# Patient Record
Sex: Female | Born: 1950
Health system: Southern US, Community
[De-identification: ages and names within clinical notes are randomized; demographics above are authoritative.]

## PROBLEM LIST (undated history)

## (undated) DIAGNOSIS — E785 Hyperlipidemia, unspecified: Secondary | ICD-10-CM

## (undated) DIAGNOSIS — J42 Unspecified chronic bronchitis: Secondary | ICD-10-CM

## (undated) DIAGNOSIS — F419 Anxiety disorder, unspecified: Secondary | ICD-10-CM

## (undated) DIAGNOSIS — J449 Chronic obstructive pulmonary disease, unspecified: Secondary | ICD-10-CM

## (undated) DIAGNOSIS — J45909 Unspecified asthma, uncomplicated: Secondary | ICD-10-CM

## (undated) DIAGNOSIS — K219 Gastro-esophageal reflux disease without esophagitis: Secondary | ICD-10-CM

## (undated) DIAGNOSIS — M069 Rheumatoid arthritis, unspecified: Secondary | ICD-10-CM

## (undated) DIAGNOSIS — M797 Fibromyalgia: Secondary | ICD-10-CM

## (undated) DIAGNOSIS — G8929 Other chronic pain: Secondary | ICD-10-CM

## (undated) DIAGNOSIS — R7303 Prediabetes: Secondary | ICD-10-CM

## (undated) DIAGNOSIS — F32A Depression, unspecified: Secondary | ICD-10-CM

## (undated) DIAGNOSIS — Z87442 Personal history of urinary calculi: Secondary | ICD-10-CM

## (undated) DIAGNOSIS — I10 Essential (primary) hypertension: Secondary | ICD-10-CM

## (undated) DIAGNOSIS — I2510 Atherosclerotic heart disease of native coronary artery without angina pectoris: Secondary | ICD-10-CM

## (undated) DIAGNOSIS — M545 Low back pain, unspecified: Secondary | ICD-10-CM

## (undated) DIAGNOSIS — G44209 Tension-type headache, unspecified, not intractable: Secondary | ICD-10-CM

## (undated) DIAGNOSIS — Z8719 Personal history of other diseases of the digestive system: Secondary | ICD-10-CM

## (undated) DIAGNOSIS — J189 Pneumonia, unspecified organism: Secondary | ICD-10-CM

## (undated) DIAGNOSIS — F329 Major depressive disorder, single episode, unspecified: Secondary | ICD-10-CM

## (undated) DIAGNOSIS — N189 Chronic kidney disease, unspecified: Secondary | ICD-10-CM

## (undated) DIAGNOSIS — G4730 Sleep apnea, unspecified: Secondary | ICD-10-CM

## (undated) DIAGNOSIS — M1990 Unspecified osteoarthritis, unspecified site: Secondary | ICD-10-CM

## (undated) HISTORY — DX: Chronic obstructive pulmonary disease, unspecified: J44.9

## (undated) HISTORY — DX: Unspecified osteoarthritis, unspecified site: M19.90

## (undated) HISTORY — DX: Fibromyalgia: M79.7

## (undated) HISTORY — PX: BACK SURGERY: SHX140

## (undated) HISTORY — DX: Gastro-esophageal reflux disease without esophagitis: K21.9

## (undated) HISTORY — PX: CATARACT EXTRACTION W/ INTRAOCULAR LENS IMPLANT: SHX1309

## (undated) HISTORY — PX: LUMBAR DISC SURGERY: SHX700

---

## 1998-05-03 ENCOUNTER — Other Ambulatory Visit: Admission: RE | Admit: 1998-05-03 | Discharge: 1998-05-03 | Payer: Self-pay | Admitting: Internal Medicine

## 1999-04-25 ENCOUNTER — Other Ambulatory Visit: Admission: RE | Admit: 1999-04-25 | Discharge: 1999-04-25 | Payer: Self-pay | Admitting: Internal Medicine

## 1999-06-07 ENCOUNTER — Encounter: Payer: Self-pay | Admitting: Internal Medicine

## 1999-06-07 ENCOUNTER — Encounter: Admission: RE | Admit: 1999-06-07 | Discharge: 1999-06-07 | Payer: Self-pay | Admitting: Internal Medicine

## 2000-05-17 ENCOUNTER — Encounter: Payer: Self-pay | Admitting: Internal Medicine

## 2000-05-17 ENCOUNTER — Encounter: Admission: RE | Admit: 2000-05-17 | Discharge: 2000-05-17 | Payer: Self-pay | Admitting: Internal Medicine

## 2000-10-22 ENCOUNTER — Other Ambulatory Visit: Admission: RE | Admit: 2000-10-22 | Discharge: 2000-10-22 | Payer: Self-pay | Admitting: Gynecology

## 2001-05-08 ENCOUNTER — Other Ambulatory Visit: Admission: RE | Admit: 2001-05-08 | Discharge: 2001-05-08 | Payer: Self-pay | Admitting: Internal Medicine

## 2003-02-10 ENCOUNTER — Encounter: Admission: RE | Admit: 2003-02-10 | Discharge: 2003-02-10 | Payer: Self-pay | Admitting: Internal Medicine

## 2003-10-12 ENCOUNTER — Ambulatory Visit (HOSPITAL_COMMUNITY): Admission: RE | Admit: 2003-10-12 | Discharge: 2003-10-12 | Payer: Self-pay | Admitting: Plastic Surgery

## 2003-10-12 ENCOUNTER — Encounter (INDEPENDENT_AMBULATORY_CARE_PROVIDER_SITE_OTHER): Payer: Self-pay | Admitting: *Deleted

## 2003-10-12 ENCOUNTER — Ambulatory Visit (HOSPITAL_BASED_OUTPATIENT_CLINIC_OR_DEPARTMENT_OTHER): Admission: RE | Admit: 2003-10-12 | Discharge: 2003-10-12 | Payer: Self-pay | Admitting: Plastic Surgery

## 2004-04-27 ENCOUNTER — Other Ambulatory Visit: Admission: RE | Admit: 2004-04-27 | Discharge: 2004-04-27 | Payer: Self-pay | Admitting: Internal Medicine

## 2004-06-26 ENCOUNTER — Ambulatory Visit (HOSPITAL_COMMUNITY): Admission: RE | Admit: 2004-06-26 | Discharge: 2004-06-26 | Payer: Self-pay | Admitting: *Deleted

## 2005-06-11 ENCOUNTER — Other Ambulatory Visit: Admission: RE | Admit: 2005-06-11 | Discharge: 2005-06-11 | Payer: Self-pay | Admitting: Internal Medicine

## 2005-06-14 ENCOUNTER — Encounter: Admission: RE | Admit: 2005-06-14 | Discharge: 2005-06-14 | Payer: Self-pay | Admitting: Internal Medicine

## 2005-10-28 ENCOUNTER — Emergency Department (HOSPITAL_COMMUNITY): Admission: EM | Admit: 2005-10-28 | Discharge: 2005-10-28 | Payer: Self-pay | Admitting: Emergency Medicine

## 2006-08-20 ENCOUNTER — Other Ambulatory Visit: Admission: RE | Admit: 2006-08-20 | Discharge: 2006-08-20 | Payer: Self-pay | Admitting: Internal Medicine

## 2006-10-01 ENCOUNTER — Encounter: Admission: RE | Admit: 2006-10-01 | Discharge: 2006-10-01 | Payer: Self-pay | Admitting: Internal Medicine

## 2008-05-05 ENCOUNTER — Ambulatory Visit (HOSPITAL_COMMUNITY): Admission: RE | Admit: 2008-05-05 | Discharge: 2008-05-06 | Payer: Self-pay | Admitting: Neurosurgery

## 2008-08-30 ENCOUNTER — Encounter: Admission: RE | Admit: 2008-08-30 | Discharge: 2008-08-30 | Payer: Self-pay | Admitting: Internal Medicine

## 2009-08-25 ENCOUNTER — Other Ambulatory Visit: Admission: RE | Admit: 2009-08-25 | Discharge: 2009-08-25 | Payer: Self-pay | Admitting: Internal Medicine

## 2010-01-30 ENCOUNTER — Encounter
Admission: RE | Admit: 2010-01-30 | Discharge: 2010-01-30 | Payer: Self-pay | Source: Home / Self Care | Attending: Internal Medicine | Admitting: Internal Medicine

## 2010-04-19 LAB — BASIC METABOLIC PANEL
BUN: 17 mg/dL (ref 6–23)
CO2: 31 mEq/L (ref 19–32)
Calcium: 8.9 mg/dL (ref 8.4–10.5)
Chloride: 105 mEq/L (ref 96–112)
Creatinine, Ser: 1.1 mg/dL (ref 0.4–1.2)
GFR calc Af Amer: >60 mL/min (ref >60)
GFR calc non Af Amer: 51 mL/min — ABNORMAL LOW (ref >60)
Glucose, Bld: 139 mg/dL — ABNORMAL HIGH (ref 70–99)
Potassium: 4.1 mEq/L (ref 3.5–5.1)
Sodium: 138 mEq/L (ref 135–145)

## 2010-04-19 LAB — CBC
HCT: 37.5 % (ref 36.0–46.0)
Hemoglobin: 13.1 g/dL (ref 12.0–15.0)
MCHC: 35 g/dL (ref 30.0–36.0)
MCV: 86.6 fL (ref 78.0–100.0)
Platelets: 211 10*3/uL (ref 150–400)
RBC: 4.33 MIL/uL (ref 3.87–5.11)
RDW: 13.9 % (ref 11.5–15.5)
WBC: 10.6 10*3/uL — ABNORMAL HIGH (ref 4.0–10.5)

## 2010-05-23 NOTE — Op Note (Signed)
NAMECANDIAS, TACKITT NO.:  [id number]   MEDICAL RECORD NO.:  [id number]          PATIENT TYPE:  OIB   LOCATION:  [id number]                         FACILITY:  [facility]   PHYSICIAN:  Leeroy Cha, M.D.   DATE OF BIRTH:  07-02-1950   DATE OF PROCEDURE:  05/05/2008  DATE OF DISCHARGE:                               OPERATIVE REPORT   PREOPERATIVE DIAGNOSIS:  Left L3-L4 herniated disk with L3  radiculopathy.   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   PROCEDURE:  Left L3-L4 decompression of the L3 nerve root with the  Matrix system as well as microscope.   SURGEON:  Leeroy Cha, MD   ASSISTANT:  Marchia Meiers. Vertell Limber, MD   CLINICAL HISTORY:  The patient was seen in my office last week  complaining of pain in the left leg.  She actually was crying, quite a  bit of weakness of the quadriceps and iliopsoas.  X-rays showed  extraforaminal disk at the level of L3-4 compromising the L3 nerve root.  Surgery was advised.   PROCEDURE:  The patient was taken to the OR and she was positioned in  prone manner.  The skin was cleaned with DuraPrep.  With the C-arm, we  localized the area between L3-4 right at the level of the  intertransverse space.  Then, an incision was made about an inch using  several cylinders to dilate the area.  We were able to get right at the  level of the lateral facet of L3-4 and the transverse process of L3-4.  Muscles were removed.  Immediately, we found the L3 nerve root which was  swollen and really fixed to the floor.  Lysis was accomplished.  We  displaced the nerve root and immediately we removed 5 large fragments of  disk compromising the L3 nerve root.  At the end, we had good  decompression with plenty of room for the L3.  The area was irrigated.  Hemostasis was done with bipolar.  Fentanyl and Depo-Medrol were left in  the operative site and the wound was closed with Vicryl and Steri-  Strips.     ______________________________  Leeroy Cha, M.D.     EB/MEDQ  D:  05/05/2008  T:  05/06/2008  Job:  [id number]

## 2010-05-26 NOTE — Op Note (Signed)
Janet Johnston, Janet Johnston                  ACCOUNT NO.:  [id number]   MEDICAL RECORD NO.:  [id number]          PATIENT TYPE:  AMB   LOCATION:  ENDO                         FACILITY:  [facility]   PHYSICIAN:  Waverly Ferrari, M.D.    DATE OF BIRTH:  02-08-1950   DATE OF PROCEDURE:  06/26/2004  DATE OF DISCHARGE:                                 OPERATIVE REPORT   PROCEDURE:  Upper endoscopy.   INDICATIONS FOR PROCEDURE:  Gastroesophageal reflux disease.   ANESTHESIA:  Demerol 40, Versed 5 mg.   DESCRIPTION OF PROCEDURE:  With the patient mildly sedated in the left  lateral decubitus position, the Olympus videoscopic endoscope was inserted  in the mouth and passed under direct vision through the esophagus which  appeared normal into the stomach. The fundus, body, antrum, duodenal bulb,  second portion of duodenum all appeared normal. From this point, the  endoscope was slowly withdrawn taking circumferential views of the duodenal  mucosa until the endoscope had been pulled back into the stomach, placed in  retroflexion to view the stomach from below. The endoscope was then  straightened and withdrawn taking circumferential views of the remaining  gastric and esophageal mucosa. The patient's vital signs and pulse oximeter  remained stable. The patient tolerated the procedure well without apparent  complications.   FINDINGS:  Unremarkable examination. Proceed to colonoscopy.       GMO/MEDQ  D:  06/26/2004  T:  06/26/2004  Job:  [id number]

## 2010-05-26 NOTE — Op Note (Signed)
NAMENASTEHO, URAM                  ACCOUNT NO.:  [id number]   MEDICAL RECORD NO.:  [id number]          PATIENT TYPE:  AMB   LOCATION:  ENDO                         FACILITY:  [facility]   PHYSICIAN:  Waverly Ferrari, M.D.    DATE OF BIRTH:  [birth year]   DATE OF PROCEDURE:  06/26/2004  DATE OF DISCHARGE:                                 OPERATIVE REPORT   PROCEDURE:  Colonoscopy.   INDICATIONS:  Cancer screening.   ANESTHESIA:  Demerol 30, Versed 2 mg.   PROCEDURE:  With the patient mildly sedated in the left lateral decubitus  position, the Olympus videoscopic colonoscope was inserted in the rectum and  passed under direct vision with pressure applied.  We reached the cecum  identified by ileocecal base, base of cecum.  From this point, the  colonoscope was slowly withdrawn taking circumferential views of the colonic  mucosa stopping only in the rectum which appeared normal.  The rectum showed  hemorrhoids in retroflex view.  The endoscope was straightened and  withdrawn.  The patient's vital signs, pulse oximetry remained stable.  The  patient tolerated the procedure well without apparent complications.   FINDINGS:  Internal hemorrhoids as well as unremarkable examination.   PLAN:  Consider repeat examination in five to ten years.       GMO/MEDQ  D:  06/26/2004  T:  06/26/2004  Job:  [id number]

## 2012-01-24 ENCOUNTER — Other Ambulatory Visit: Payer: Self-pay | Admitting: Registered Nurse

## 2012-01-24 ENCOUNTER — Other Ambulatory Visit (HOSPITAL_COMMUNITY)
Admission: RE | Admit: 2012-01-24 | Discharge: 2012-01-24 | Disposition: A | Discharge status: Home / Self Care | Payer: BC Managed Care – PPO | Source: Ambulatory Visit | Attending: Internal Medicine | Admitting: Internal Medicine

## 2012-01-24 DIAGNOSIS — Z01419 Encounter for gynecological examination (general) (routine) without abnormal findings: Secondary | ICD-10-CM | POA: Insufficient documentation

## 2012-08-02 ENCOUNTER — Ambulatory Visit (HOSPITAL_COMMUNITY)
Admit: 2012-08-02 | Discharge: 2012-08-02 | Disposition: A | Discharge status: Home / Self Care | Payer: BC Managed Care – PPO | Attending: Emergency Medicine | Admitting: Emergency Medicine

## 2012-08-02 ENCOUNTER — Encounter (HOSPITAL_COMMUNITY): Payer: Self-pay | Admitting: Emergency Medicine

## 2012-08-02 ENCOUNTER — Emergency Department (HOSPITAL_COMMUNITY)
Admission: EM | Admit: 2012-08-02 | Discharge: 2012-08-02 | Disposition: A | Discharge status: Home / Self Care | Payer: BC Managed Care – PPO | Source: Home / Self Care | Attending: Emergency Medicine | Admitting: Emergency Medicine

## 2012-08-02 DIAGNOSIS — M79609 Pain in unspecified limb: Secondary | ICD-10-CM | POA: Insufficient documentation

## 2012-08-02 DIAGNOSIS — I809 Phlebitis and thrombophlebitis of unspecified site: Secondary | ICD-10-CM

## 2012-08-02 DIAGNOSIS — M7989 Other specified soft tissue disorders: Secondary | ICD-10-CM

## 2012-08-02 DIAGNOSIS — I8000 Phlebitis and thrombophlebitis of superficial vessels of unspecified lower extremity: Secondary | ICD-10-CM | POA: Insufficient documentation

## 2012-08-02 HISTORY — DX: Essential (primary) hypertension: I10

## 2012-08-02 HISTORY — DX: Rheumatoid arthritis, unspecified: M06.9

## 2012-08-02 MED ORDER — ENOXAPARIN (LOVENOX) PATIENT EDUCATION KIT: PACK | Freq: Once | Status: DC

## 2012-08-02 MED ORDER — ENOXAPARIN SODIUM 30 MG/0.3ML ~~LOC~~ SOLN: 90.0000 mg | Freq: Two times a day (BID) | SUBCUTANEOUS | Status: DC

## 2012-08-02 MED ORDER — ENOXAPARIN SODIUM 100 MG/ML ~~LOC~~ SOLN: 90.0000 mg | Freq: Once | SUBCUTANEOUS | Status: DC

## 2012-08-02 MED ORDER — ENOXAPARIN SODIUM 100 MG/ML ~~LOC~~ SOLN
90.0000 mg | Freq: Once | SUBCUTANEOUS | Status: AC
  Administered 2012-08-02: 90 mg via SUBCUTANEOUS
  Filled 2012-08-02: qty 1

## 2012-08-02 MED ORDER — ENOXAPARIN (LOVENOX) PATIENT EDUCATION KIT
PACK | Freq: Once | Status: DC
  Filled 2012-08-02: qty 1

## 2012-08-02 NOTE — ED Provider Notes (Signed)
Chief Complaint:   Chief Complaint  Patient presents with  . 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 These have not given her much trouble up until last night when she felt a cramp-like sensation in her right calf. Today the pain has subsided, but she has a tender, palpable cord on the surface of the calf which is slightly erythematous. She denies any swelling further up in the leg, chest pain, or shortness of breath. She has had no history of thrombophlebitis or pulmonary embolism. No recent prolonged car trips or history of cancer.  Review of Systems:  Other than noted above, the patient denies any of the following symptoms: Systemic:  No fever, chills, sweats, weight gain or loss. Respiratory:  No coughing, wheezing, or shortness of breath. Cardiac:  No chest pain, tightness, pressure or syncope. GI:  No abdominal pain, swelling, distension, nausea, or vomiting. GU:  No dysuria, frequency, or hematuria. Ext:  No joint pain, muscle pain, or weakness. Skin:  No rash or itching. Neuro:  No paresthesias.  Albee:  Past medical history, family history, social history, meds, and allergies were reviewed.  She is allergic to penicillin and Lyrica. She takes amlodipine, bisoprolol, folate acid, and methotrexate. She has rheumatoid arthritis, hypertension, hypercholesterolemia, and fibromyalgia.  Physical Exam:   Vital signs:  BP 131/80  Pulse 83  Temp(Src) 98.8 F (37.1 C) (Oral)  Resp 20  Wt 195 lb (88.451 kg)  SpO2 96% Gen:  Alert, oriented, in no distress. Neck:  No tenderness, adenopathy, or JVD. Lungs:  Breath sounds clear and equal bilaterally.  No rales, rhonchi or wheezes. Heart:  Regular rhythm, no gallops or murmers. Abdomen:  Soft, nontender, no organomegaly or mass. Ext:  She has a palpable cord in the posterior right calf with overlying erythema. The calf is  tender. The leg is somewhat swollen. Pulses are full. There is no pitting edema. Neuro:  Alert and oriented times 3.  No muscle weakness.  Sensation intact to light touch. Skin:  Warm and dry.  No rash or skin lesions.  Radiology:  Her venous duplex shows no evidence of deep venous thrombophlebitis. She does have superficial thrombophlebitis.  Course in Urgent Care Center:  She was given Lovenox 1 mg per kilogram as a single subcutaneous dose.  Assessment:  The encounter diagnosis was Superficial thrombophlebitis.  Since no evidence of DVT, does not need continued Lovenox or Coumadin. Will treat with elevation, rest, heat, and aspirin 325 mg, 2 tablets twice a day, and have her followup with her primary care Dr. early next week.  Plan:   1.  The following meds were prescribed:   Discharge Medication List as of 08/02/2012  7:06 PM     2.  The patient was instructed in symptomatic care and handouts were given. 3.  The patient was told to return if becoming worse in any way, if no better in 3 or 4 days, and given some red flag symptoms such as any shortness of breath or chest pain that would indicate earlier return. 4.  Follow up at the emergency room if she should have red flag symptoms and with her primary care Dr. for routine followup.    Harden Mo, MD 08/02/12 2003

## 2012-08-02 NOTE — Progress Notes (Signed)
VASCULAR LAB PRELIMINARY  PRELIMINARY  PRELIMINARY  PRELIMINARY  Right lower extremity venous Doppler completed.    Preliminary report:  There is no DVT noted in the right lower extremity.  There is superficial thrombophlebitis noted in varicosities in the right calf.  Janise Gora, RVT 08/02/2012, 7:08 PM

## 2012-08-02 NOTE — ED Notes (Signed)
Ordering lovenox from pharmacy.  Patient states weight of 195 pounds.  Also requested teaching kit from pharmacy

## 2012-08-02 NOTE — ED Notes (Signed)
Staff at registration is picking up lovenox.  Vascular lab ready, being transported by shuttle, family with patient

## 2012-08-02 NOTE — ED Notes (Signed)
Verified aspirin dosing with dr Jake Michaelis, no change in instructions

## 2012-08-02 NOTE — ED Notes (Signed)
Ready to return to [facility]

## 2012-08-02 NOTE — ED Notes (Signed)
[id number] Expected date:08/02/12<BR> Expected time: 7:01 PM<BR> Means of arrival:Car<BR> Comments:<BR> return from vascular lab

## 2012-08-02 NOTE — ED Notes (Signed)
Right leg pain, last night felt like a deep cramp in lower leg, today visible pink color to leg and patient reports this particular area is "touchy"

## 2012-08-18 ENCOUNTER — Other Ambulatory Visit: Payer: Self-pay | Admitting: Internal Medicine

## 2012-08-18 ENCOUNTER — Encounter (INDEPENDENT_AMBULATORY_CARE_PROVIDER_SITE_OTHER): Payer: BC Managed Care – PPO | Admitting: *Deleted

## 2012-08-18 DIAGNOSIS — R609 Edema, unspecified: Secondary | ICD-10-CM

## 2012-08-18 DIAGNOSIS — I809 Phlebitis and thrombophlebitis of unspecified site: Secondary | ICD-10-CM

## 2012-08-20 ENCOUNTER — Encounter: Payer: Self-pay | Admitting: Internal Medicine

## 2013-02-03 ENCOUNTER — Other Ambulatory Visit: Payer: Self-pay

## 2013-02-03 DIAGNOSIS — Z1231 Encounter for screening mammogram for malignant neoplasm of breast: Secondary | ICD-10-CM

## 2013-02-23 ENCOUNTER — Ambulatory Visit: Payer: BC Managed Care – PPO

## 2013-03-11 ENCOUNTER — Ambulatory Visit
Admission: RE | Admit: 2013-03-11 | Discharge: 2013-03-11 | Disposition: A | Discharge status: Home / Self Care | Payer: BC Managed Care – PPO | Source: Ambulatory Visit

## 2013-03-11 DIAGNOSIS — Z1231 Encounter for screening mammogram for malignant neoplasm of breast: Secondary | ICD-10-CM

## 2013-09-10 ENCOUNTER — Emergency Department (HOSPITAL_COMMUNITY): Payer: Worker's Compensation

## 2013-09-10 ENCOUNTER — Emergency Department (HOSPITAL_COMMUNITY)
Admission: EM | Admit: 2013-09-10 | Discharge: 2013-09-10 | Disposition: A | Discharge status: Home / Self Care | Payer: Worker's Compensation | Attending: Emergency Medicine | Admitting: Emergency Medicine

## 2013-09-10 ENCOUNTER — Encounter (HOSPITAL_COMMUNITY): Payer: Self-pay | Admitting: Emergency Medicine

## 2013-09-10 DIAGNOSIS — S0003XA Contusion of scalp, initial encounter: Secondary | ICD-10-CM | POA: Insufficient documentation

## 2013-09-10 DIAGNOSIS — M069 Rheumatoid arthritis, unspecified: Secondary | ICD-10-CM | POA: Insufficient documentation

## 2013-09-10 DIAGNOSIS — W19XXXA Unspecified fall, initial encounter: Secondary | ICD-10-CM

## 2013-09-10 DIAGNOSIS — S0083XA Contusion of other part of head, initial encounter: Principal | ICD-10-CM | POA: Insufficient documentation

## 2013-09-10 DIAGNOSIS — I10 Essential (primary) hypertension: Secondary | ICD-10-CM | POA: Insufficient documentation

## 2013-09-10 DIAGNOSIS — T148XXA Other injury of unspecified body region, initial encounter: Secondary | ICD-10-CM

## 2013-09-10 DIAGNOSIS — S298XXA Other specified injuries of thorax, initial encounter: Secondary | ICD-10-CM | POA: Insufficient documentation

## 2013-09-10 DIAGNOSIS — S0001XA Abrasion of scalp, initial encounter: Secondary | ICD-10-CM

## 2013-09-10 DIAGNOSIS — W11XXXA Fall on and from ladder, initial encounter: Secondary | ICD-10-CM | POA: Insufficient documentation

## 2013-09-10 DIAGNOSIS — Y9389 Activity, other specified: Secondary | ICD-10-CM | POA: Insufficient documentation

## 2013-09-10 DIAGNOSIS — Z7982 Long term (current) use of aspirin: Secondary | ICD-10-CM | POA: Insufficient documentation

## 2013-09-10 DIAGNOSIS — Y9289 Other specified places as the place of occurrence of the external cause: Secondary | ICD-10-CM | POA: Insufficient documentation

## 2013-09-10 DIAGNOSIS — S0990XA Unspecified injury of head, initial encounter: Secondary | ICD-10-CM | POA: Insufficient documentation

## 2013-09-10 DIAGNOSIS — S1093XA Contusion of unspecified part of neck, initial encounter: Principal | ICD-10-CM

## 2013-09-10 DIAGNOSIS — F17200 Nicotine dependence, unspecified, uncomplicated: Secondary | ICD-10-CM | POA: Insufficient documentation

## 2013-09-10 DIAGNOSIS — Z880 Allergy status to penicillin: Secondary | ICD-10-CM | POA: Insufficient documentation

## 2013-09-10 DIAGNOSIS — IMO0002 Reserved for concepts with insufficient information to code with codable children: Secondary | ICD-10-CM | POA: Insufficient documentation

## 2013-09-10 DIAGNOSIS — Z79899 Other long term (current) drug therapy: Secondary | ICD-10-CM | POA: Insufficient documentation

## 2013-09-10 DIAGNOSIS — Z23 Encounter for immunization: Secondary | ICD-10-CM | POA: Insufficient documentation

## 2013-09-10 MED ORDER — MORPHINE SULFATE 4 MG/ML IJ SOLN
4.0000 mg | Freq: Once | INTRAMUSCULAR | Status: AC
  Administered 2013-09-10: 4 mg via INTRAMUSCULAR

## 2013-09-10 MED ORDER — TETANUS-DIPHTH-ACELL PERTUSSIS 5-2.5-18.5 LF-MCG/0.5 IM SUSP
0.5000 mL | Freq: Once | INTRAMUSCULAR | Status: AC
  Administered 2013-09-10: 0.5 mL via INTRAMUSCULAR
  Filled 2013-09-10: qty 0.5

## 2013-09-10 MED ORDER — TRAMADOL HCL 50 MG PO TABS: 50.0000 mg | ORAL_TABLET | Freq: Four times a day (QID) | ORAL | Status: DC | PRN

## 2013-09-10 MED ORDER — MORPHINE SULFATE 2 MG/ML IJ SOLN
INTRAMUSCULAR | Status: AC
  Filled 2013-09-10: qty 2

## 2013-09-10 MED ORDER — HYDROCODONE-ACETAMINOPHEN 5-325 MG PO TABS: 1.0000 | ORAL_TABLET | Freq: Once | ORAL | Status: DC

## 2013-09-10 NOTE — ED Notes (Signed)
Patient transported to X-ray 

## 2013-09-10 NOTE — ED Provider Notes (Signed)
CSN: [id number]     Arrival date & time 09/10/13  1147 History   First MD Initiated Contact with Patient 09/10/13 1357     Chief Complaint  Patient presents with  . Fall    The patient said she fell off the ladder and hit butt first.  She denies LOC but she hit her head, and her right forearm.     (Consider location/radiation/quality/duration/timing/severity/associated sxs/prior Treatment) HPI  This is a 63 year old female who presents following a fall. Patient states that she was on the next to last step on a stepladder and misstepped. She fell backwards hitting her bottom on a concrete floor and striking her head on some chilling. She denies loss of consciousness. She is on a baby aspirin daily but no other anticoagulants.  She reports bilateral buttock pain, back pain, and to 2/10 head pain. She was evaluated by EMS but refused transport.  Patient has been ambulatory. She denies any weakness, numbness, or tingling in her legs.  Patient also reports weird sensation in her right chest. It is worse with movement. She did not strike it on anything that she knows of. She denies any shortness of breath.  Past Medical History  Diagnosis Date  . Hypertension   . Rheumatoid arthritis    Past Surgical History  Procedure Laterality Date  . Lower back     History reviewed. No pertinent family history. History  Substance Use Topics  . Smoking status: Current Every Day Smoker  . Smokeless tobacco: Not on file  . Alcohol Use: No   OB History   Grav Para Term Preterm Abortions TAB SAB Ect Mult Living                 Review of Systems  Constitutional: Negative for fever.  Respiratory: Negative for cough, chest tightness and shortness of breath.   Cardiovascular: Negative for chest pain.  Gastrointestinal: Negative for nausea, vomiting and abdominal pain.  Genitourinary: Negative for dysuria.  Musculoskeletal: Positive for back pain. Negative for gait problem and neck pain.  Skin: Positive  for wound.  Neurological: Positive for headaches.  Psychiatric/Behavioral: Negative for confusion.  All other systems reviewed and are negative.     Allergies  Lyrica; Penicillins; and Sulfa antibiotics  Home Medications   Prior to Admission medications   Medication Sig Start Date End Date Taking? Authorizing Provider  amLODipine (NORVASC) 5 MG tablet Take 5 mg by mouth daily.   Yes Historical Provider, MD  aspirin 81 MG tablet Take 81 mg by mouth at bedtime.    Yes Historical Provider, MD  bisoprolol (ZEBETA) 5 MG tablet Take 5 mg by mouth at bedtime.   Yes Historical Provider, MD  ergocalciferol (VITAMIN D2) 50000 UNITS capsule Take 50,000 Units by mouth every Friday.   Yes Historical Provider, MD  FLUoxetine (PROZAC) 10 MG tablet Take 20 mg by mouth every morning.    Yes Historical Provider, MD  losartan (COZAAR) 25 MG tablet Take 50 mg by mouth at bedtime.    Yes Historical Provider, MD  traMADol (ULTRAM) 50 MG tablet Take 50 mg by mouth every 6 (six) hours as needed for severe pain.   Yes Historical Provider, MD  traMADol (ULTRAM) 50 MG tablet Take 1 tablet (50 mg total) by mouth every 6 (six) hours as needed. 09/10/13   Merryl Hacker, MD   BP 117/59  Pulse 70  Temp(Src) 98 F (36.7 C) (Oral)  Resp 16  Ht 5\' 4"  (1.626 m)  Wt  192 lb (87.091 kg)  BMI 32.94 kg/m2  SpO2 95% Physical Exam  Nursing note and vitals reviewed. Constitutional: She is oriented to person, place, and time. She appears well-developed and well-nourished. No distress.  HENT:  Head: Normocephalic.  Mouth/Throat: Oropharynx is clear and moist.  Less than 1/2 cm abrasion over the right occiput, no active bleeding, no hematoma  Eyes: Pupils are equal, round, and reactive to light.  Neck: Normal range of motion. Neck supple.  No midline C-spine tenderness  Cardiovascular: Normal rate, regular rhythm and normal heart sounds.   Pulmonary/Chest: Effort normal and breath sounds normal. No respiratory  distress. She has no wheezes. She exhibits tenderness.  Abdominal: Soft. Bowel sounds are normal. She exhibits no distension. There is no tenderness.  Musculoskeletal: She exhibits no edema.  Normal range of motion of the bilateral hips and knees without deformity, normal strength, no midline tenderness to palpation over the lumbar spine, there is bilateral paraspinous muscle tenderness and spasm  Neurological: She is alert and oriented to person, place, and time.  Skin: Skin is warm and dry.  Abrasion as noted above  Psychiatric: She has a normal mood and affect.    ED Course  Procedures (including critical care time) Labs Review Labs Reviewed - No data to display  Imaging Review Dg Chest 2 View  09/10/2013   CLINICAL DATA:  Right-sided chest pain.  EXAM: CHEST  2 VIEW  COMPARISON:  05/04/2008.  FINDINGS: Cardiac silhouette normal in size, unchanged. Mild atherosclerosis involving the aortic arch, unchanged. Hilar and mediastinal contours otherwise unremarkable. Lungs clear. Bronchovascular markings normal. Pulmonary vascularity normal. No visible pleural effusions. No pneumothorax. Mild degenerative changes involving the thoracic spine. No significant interval change.  IMPRESSION: No acute cardiopulmonary disease.  Stable examination.   Electronically Signed   By: Evangeline Dakin M.D.   On: 09/10/2013 14:57   Dg Lumbar Spine Complete  09/10/2013   CLINICAL DATA:  Fall from ladder with gluteal injury.  EXAM: LUMBAR SPINE - COMPLETE 4+ VIEW  COMPARISON:  09/01/2013  FINDINGS: No acute fracture or traumatic malalignment. Grade 1 anterolisthesis at L5-S1 is chronic based on previous imaging and related to advanced facet osteoarthritis. Degenerative narrowing present at L5-S1.  Prominent atherosclerotic calcification of the aorta and branch vessels.  IMPRESSION: 1. No acute osseous findings. 2. 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   Electronically Signed   By: Jorje Guild  M.D.   On: 09/10/2013 15:02   Dg Pelvis 1-2 Views  09/10/2013   CLINICAL DATA:  [place] off of a ladder, landing on the sacrococcygeal region.  EXAM: PELVIS - 1-2 VIEW  COMPARISON:  None.  FINDINGS: No acute fractures involving the bony pelvis. Sacroiliac joints intact with mild degenerative changes. Symphysis pubis intact. Joint spaces in both hips well preserved. Bone mineral density well-preserved. Degenerative changes involving the visualized lower lumbar spine.  IMPRESSION: No acute osseous abnormality.   Electronically Signed   By: Evangeline Dakin M.D.   On: 09/10/2013 14:59   Ct Head Wo Contrast  09/10/2013   CLINICAL DATA:  [place] from ladder  EXAM: CT HEAD WITHOUT CONTRAST  TECHNIQUE: Contiguous axial images were obtained from the base of the skull through the vertex without intravenous contrast.  COMPARISON:  None.  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 Negative for acute infarct. Negative for hemorrhage or mass. Ventricle size normal.  Negative for skull fracture. Mucosal thickening in the sphenoid sinus.  IMPRESSION: No acute abnormality.  Electronically Signed   By: Franchot Gallo M.D.   On: 09/10/2013 15:08     EKG Interpretation None      MDM   Final diagnoses:  Scalp abrasion, initial encounter  Contusion  Fall, initial encounter  Minor head injury, initial encounter    Patient presents following a fall. Reports injury to her buttock, head. She is nontoxic-appearing. Vital signs are reassuring in ABCs are intact. Imaging obtained including head CT, lumbar spine, pelvis, and chest. Imaging is unremarkable. Patient was given IM morphine for pain and reports improvement of pain. She's been ambulatory. Tetanus was updated.  Patient has previously tolerated tramadol for pain. Discussed with patient she will likely be more sore tomorrow. She was encouraged to take anti-inflammatories as well as she is able to tolerate. Patient stated understanding.   After  history, exam, and medical workup I feel the patient has been appropriately medically screened and is safe for discharge home. Pertinent diagnoses were discussed with the patient. Patient was given return precautions.     Merryl Hacker, MD 09/10/13 [phone]

## 2013-09-10 NOTE — ED Notes (Signed)
Pt ambulated down the hall to the restroom with no assistance. Pt placed on monitor upon return to room from restroom. Pt continues to be monitored by blood pressure and pulse ox. Pt provided with ginger ale to complete PO challenge.

## 2013-09-10 NOTE — Discharge Instructions (Signed)
Contusion A contusion is a deep bruise. Contusions are the result of an injury that caused bleeding under the skin. The contusion may turn blue, purple, or yellow. Minor injuries will give you a painless contusion, but more severe contusions may stay painful and swollen for a few weeks.  CAUSES  A contusion is usually caused by a blow, trauma, or direct force to an area of the body. SYMPTOMS   Swelling and redness of the injured area.  Bruising of the injured area.  Tenderness and soreness of the injured area.  Pain. DIAGNOSIS  The diagnosis can be made by taking a history and physical exam. An X-ray, CT scan, or MRI may be needed to determine if there were any associated injuries, such as fractures. TREATMENT  Specific treatment will depend on what area of the body was injured. In general, the best treatment for a contusion is resting, icing, elevating, and applying cold compresses to the injured area. Over-the-counter medicines may also be recommended for pain control. Ask your caregiver what the best treatment is for your contusion. HOME CARE INSTRUCTIONS   Put ice on the injured area.  Put ice in a plastic bag.  Place a towel between your skin and the bag.  Leave the ice on for 15-20 minutes, 3-4 times a day, or as directed by your health care provider.  Only take over-the-counter or prescription medicines for pain, discomfort, or fever as directed by your caregiver. Your caregiver may recommend avoiding anti-inflammatory medicines (aspirin, ibuprofen, and naproxen) for 48 hours because these medicines may increase bruising.  Rest the injured area.  If possible, elevate the injured area to reduce swelling. SEEK IMMEDIATE MEDICAL CARE IF:   You have increased bruising or swelling.  You have pain that is getting worse.  Your swelling or pain is not relieved with medicines. MAKE SURE YOU:   Understand these instructions.  Will watch your condition.  Will get help right  away if you are not doing well or get worse. Document Released: 10/04/2004 Document Revised: 12/30/2012 Document Reviewed: 10/30/2010 [facility] Patient Information 2015 [place], [place]. This information is not intended to replace advice given to you by your health care provider. Make sure you discuss any questions you have with your health care provider.  Abrasions An abrasion is a cut or scrape of the skin. Abrasions do not go through all layers of the skin. HOME CARE  If a bandage (dressing) was put on your wound, change it as told by your doctor. If the bandage sticks, soak it off with warm.  Wash the area with water and soap 2 times a day. Rinse off the soap. Pat the area dry with a clean towel.  Put on medicated cream (ointment) as told by your doctor.  Change your bandage right away if it gets wet or dirty.  Only take medicine as told by your doctor.  See your doctor within 24-48 hours to get your wound checked.  Check your wound for redness, puffiness (swelling), or yellowish-white fluid (pus). GET HELP RIGHT AWAY IF:   You have more pain in the wound.  You have redness, swelling, or tenderness around the wound.  You have pus coming from the wound.  You have a fever or lasting symptoms for more than 2-3 days.  You have a fever and your symptoms suddenly get worse.  You have a bad smell coming from the wound or bandage. MAKE SURE YOU:   Understand these instructions.  Will watch your condition.  Will get help right away if you are not doing well or get worse. Document Released: 06/13/2007 Document Revised: 09/19/2011 Document Reviewed: 11/28/2010 [facility] Patient Information 2015 [place], [place]. This information is not intended to replace advice given to you by your health care provider. Make sure you discuss any questions you have with your health care provider. Head Injury You have received a head injury. It does not appear serious at this time. Headaches and  vomiting are common following head injury. It should be easy to awaken from sleeping. Sometimes it is necessary for you to stay in the emergency department for a while for observation. Sometimes admission to the hospital may be needed. After injuries such as yours, most problems occur within the first 24 hours, but side effects may occur up to 7-10 days after the injury. It is important for you to carefully monitor your condition and contact your health care provider or seek immediate medical care if there is a change in your condition. WHAT ARE THE TYPES OF HEAD INJURIES? Head injuries can be as minor as a bump. Some head injuries can be more severe. More severe head injuries include:  A jarring injury to the brain (concussion).  A bruise of the brain (contusion). This mean there is bleeding in the brain that can cause swelling.  A cracked skull (skull fracture).  Bleeding in the brain that collects, clots, and forms a bump (hematoma). WHAT CAUSES A HEAD INJURY? A serious head injury is most likely to happen to someone who is in a car wreck and is not wearing a seat belt. Other causes of major head injuries include bicycle or motorcycle accidents, sports injuries, and falls. HOW ARE HEAD INJURIES DIAGNOSED? A complete history of the event leading to the injury and your current symptoms will be helpful in diagnosing head injuries. Many times, pictures of the brain, such as CT or MRI are needed to see the extent of the injury. Often, an overnight hospital stay is necessary for observation.  WHEN SHOULD I SEEK IMMEDIATE MEDICAL CARE?  You should get help right away if:  You have confusion or drowsiness.  You feel sick to your stomach (nauseous) or have continued, forceful vomiting.  You have dizziness or unsteadiness that is getting worse.  You have severe, continued headaches not relieved by medicine. Only take over-the-counter or prescription medicines for pain, fever, or discomfort as  directed by your health care provider.  You do not have normal function of the arms or legs or are unable to walk.  You notice changes in the black spots in the center of the colored part of your eye (pupil).  You have a clear or bloody fluid coming from your nose or ears.  You have a loss of vision. During the next 24 hours after the injury, you must stay with someone who can watch you for the warning signs. This person should contact local emergency services (911 in the [place] if you have seizures, you become unconscious, or you are unable to wake up. HOW CAN I PREVENT A HEAD INJURY IN THE FUTURE? The most important factor for preventing major head injuries is avoiding motor vehicle accidents. To minimize the potential for damage to your head, it is crucial to wear seat belts while riding in motor vehicles. Wearing helmets while bike riding and playing collision sports (like football) is also helpful. Also, avoiding dangerous activities around the house will further help reduce your risk of head injury.  WHEN CAN I RETURN  TO NORMAL ACTIVITIES AND ATHLETICS? You should be reevaluated by your health care provider before returning to these activities. If you have any of the following symptoms, you should not return to activities or contact sports until 1 week after the symptoms have stopped:  Persistent headache.  Dizziness or vertigo.  Poor attention and concentration.  Confusion.  Memory problems.  Nausea or vomiting.  Fatigue or tire easily.  Irritability.  Intolerant of bright lights or loud noises.  Anxiety or depression.  Disturbed sleep. MAKE SURE YOU:   Understand these instructions.  Will watch your condition.  Will get help right away if you are not doing well or get worse. Document Released: 12/25/2004 Document Revised: 12/30/2012 Document Reviewed: 09/01/2012 [facility] Patient Information 2015 [place], [place]. This information is not intended to replace advice  given to you by your health care provider. Make sure you discuss any questions you have with your health care provider.

## 2013-09-10 NOTE — ED Notes (Signed)
The patient said she fell off the ladder and hit butt first.  She denies LOC but she hit her head, and her right forearm.  She says her head burns, her ribs on her right side do not feel "right" and denies pain on her right forearm.  The patient said EMS came out and advised her she might need stitches on her head and offered to bring her but she refused.  Her husband drove her here to be evaluated.  She rates her pain 2/10 in her head.  Her ribs do not hurt but she says they feel awckward.  She did mention her "bottom" hurts.

## 2013-09-10 NOTE — ED Notes (Signed)
Pt now in ct scan

## 2013-09-10 NOTE — ED Notes (Signed)
Went to give pt pain meds , pt refused, Will get IM pain med ordered by MD

## 2014-06-29 ENCOUNTER — Other Ambulatory Visit: Payer: Self-pay | Admitting: Gastroenterology

## 2014-06-29 DIAGNOSIS — R109 Unspecified abdominal pain: Secondary | ICD-10-CM

## 2014-07-01 ENCOUNTER — Other Ambulatory Visit: Payer: Self-pay

## 2014-07-01 DIAGNOSIS — Z1231 Encounter for screening mammogram for malignant neoplasm of breast: Secondary | ICD-10-CM

## 2014-07-02 ENCOUNTER — Ambulatory Visit
Admission: RE | Admit: 2014-07-02 | Discharge: 2014-07-02 | Disposition: A | Discharge status: Home / Self Care | Payer: BLUE CROSS/BLUE SHIELD | Source: Ambulatory Visit | Attending: Gastroenterology | Admitting: Gastroenterology

## 2014-07-02 DIAGNOSIS — R109 Unspecified abdominal pain: Secondary | ICD-10-CM

## 2014-07-29 ENCOUNTER — Ambulatory Visit: Payer: Self-pay

## 2014-07-29 ENCOUNTER — Ambulatory Visit
Admission: RE | Admit: 2014-07-29 | Discharge: 2014-07-29 | Disposition: A | Discharge status: Home / Self Care | Payer: BLUE CROSS/BLUE SHIELD | Source: Ambulatory Visit

## 2014-07-29 DIAGNOSIS — Z1231 Encounter for screening mammogram for malignant neoplasm of breast: Secondary | ICD-10-CM

## 2015-02-21 ENCOUNTER — Telehealth: Payer: Self-pay | Admitting: Cardiology

## 2015-02-21 NOTE — Telephone Encounter (Signed)
Received records from [facility] for appointment on 03/22/15 with Dr Stanford Breed.  Records given to [facility] (medical records) for Dr Jacalyn Lefevre schedule on 03/22/15. lp

## 2015-03-14 NOTE — Progress Notes (Signed)
HPI: 65 year old female for evaluation of Chest pain. 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 Follow-up recommended 5 years. Patient states she has had intermittent chest pain for greater than one year. It is substernal with radiation to her left shoulder. It occurs with stress and at rest. It lasts minutes and resolves spontaneously. No associated symptoms. Not exertional, pleuritic or positional. She has some dyspnea on exertion but no orthopnea, PND, pedal edema or syncope. Because of the above cardiology asked to evaluate.  Current Outpatient Prescriptions  Medication Sig Dispense Refill  . amLODipine (NORVASC) 5 MG tablet Take 5 mg by mouth daily.    . bisoprolol (ZEBETA) 5 MG tablet Take 5 mg by mouth at bedtime.    . ergocalciferol (VITAMIN D2) 50000 UNITS capsule Take 50,000 Units by mouth every Friday.    [name] FLUoxetine (PROZAC) 10 MG tablet Take 40 mg by mouth every morning.     [name] losartan (COZAAR) 25 MG tablet Take 50 mg by mouth at bedtime.     . traMADol (ULTRAM) 50 MG tablet Take 50 mg by mouth every 6 (six) hours as needed for severe pain.     No current facility-administered medications for this visit.    Allergies  Allergen Reactions  . Lyrica [Pregabalin] Itching and Rash  . Penicillins Itching and Rash  . Sulfa Antibiotics Itching and Rash     Past Medical History  Diagnosis Date  . Hypertension   . Rheumatoid arthritis ([place])   . COPD (chronic obstructive pulmonary disease) ([place])   . Nephrolithiasis   . GERD (gastroesophageal reflux disease)   . Fibromyalgia   . DJD (degenerative joint disease)     Past Surgical History  Procedure Laterality Date  . Lower back      Social History   Social History  . Marital Status: Married    Spouse Name: N/A  . Number of Children: 3  . Years of Education: N/A   Occupational History  . Not on file.   Social History Main Topics  . Smoking status: Current Every Day  Smoker  . Smokeless tobacco: Not on file  . Alcohol Use: 0.0 oz/week    0 Standard drinks or equivalent per week     Comment: Occasional  . Drug Use: No  . Sexual Activity: Not on file   Other Topics Concern  . Not on file   Social History Narrative    Family History  Problem Relation Age of Onset  . Heart failure Mother   . Heart disease Mother   . Diabetes Mother   . Hypertension Mother   . Emphysema Father   . Heart failure Father     CABG, AVR    ROS: no fevers or chills, productive cough, hemoptysis, dysphasia, odynophagia, melena, hematochezia, dysuria, hematuria, rash, seizure activity, orthopnea, PND, pedal edema, claudication. Remaining systems are negative.  Physical Exam:   Blood pressure 156/94, pulse 62, height 5\' 5"  (1.651 m), weight 192 lb (87.091 kg).  General:  Well developed/obese in NAD Skin warm/dry Patient not depressed No peripheral clubbing Back-normal HEENT-normal/normal eyelids Neck supple/normal carotid upstroke bilaterally; no bruits; no JVD; no thyromegaly chest - CTA/ normal expansion CV - RRR/normal S1 and S2; no murmurs, rubs or gallops;  PMI nondisplaced Abdomen -NT/ND, no HSM, no mass, + bowel sounds, no bruit 2+ femoral pulses, no bruits Ext-no edema, chords, 2+ DP Neuro-grossly nonfocal  ECG  NSR, no ST changes

## 2015-03-21 ENCOUNTER — Telehealth: Payer: Self-pay

## 2015-03-22 ENCOUNTER — Encounter: Payer: Self-pay | Admitting: Cardiology

## 2015-03-22 ENCOUNTER — Ambulatory Visit (INDEPENDENT_AMBULATORY_CARE_PROVIDER_SITE_OTHER): Payer: BLUE CROSS/BLUE SHIELD | Admitting: Cardiology

## 2015-03-22 VITALS — BP 156/94 | HR 62 | Ht 65.0 in | Wt 192.0 lb

## 2015-03-22 DIAGNOSIS — R0781 Pleurodynia: Secondary | ICD-10-CM

## 2015-03-22 DIAGNOSIS — R072 Precordial pain: Secondary | ICD-10-CM

## 2015-03-22 DIAGNOSIS — I10 Essential (primary) hypertension: Secondary | ICD-10-CM | POA: Diagnosis not present

## 2015-03-22 DIAGNOSIS — Z720 Tobacco use: Secondary | ICD-10-CM

## 2015-03-22 DIAGNOSIS — R079 Chest pain, unspecified: Secondary | ICD-10-CM | POA: Insufficient documentation

## 2015-03-22 DIAGNOSIS — I714 Abdominal aortic aneurysm, without rupture, unspecified: Secondary | ICD-10-CM | POA: Insufficient documentation

## 2015-03-22 MED ORDER — AMLODIPINE BESYLATE 10 MG PO TABS: 10.0000 mg | ORAL_TABLET | Freq: Every day | ORAL | Status: DC

## 2015-03-22 NOTE — Patient Instructions (Addendum)
Medication Instructions:   INCREASE AMLODIPINE TO 10 MG ONCE DAILY= 2 OF THE 5 MG TABLETS ONCE DAILY  Testing/Procedures:  Your physician has requested that you have a lexiscan myoview. For further information please visit [email or web address]. Please follow instruction sheet, as given.    Follow-Up:  Your physician wants you to follow-up in: [place] will receive a reminder letter in the mail two months in advance. If you don't receive a letter, please call our office to schedule the follow-up appointment.

## 2015-03-22 NOTE — Assessment & Plan Note (Addendum)
Patient will need a follow-up ultrasound in June 2021.

## 2015-03-22 NOTE — Assessment & Plan Note (Signed)
Symptoms atypical. Plan Lexiscan nuclear study for risk stratification. She cannot ambulate well on a treadmill because of knee arthralgias

## 2015-03-22 NOTE — Assessment & Plan Note (Signed)
Blood pressure elevated. Increase amlodipine to 10 mg daily and follow-up with primary care.

## 2015-03-22 NOTE — Assessment & Plan Note (Signed)
Patient counseled on discontinuing. 

## 2015-03-22 NOTE — Telephone Encounter (Signed)
ERROR

## 2015-03-29 ENCOUNTER — Telehealth (HOSPITAL_COMMUNITY): Payer: Self-pay

## 2015-03-29 NOTE — Telephone Encounter (Signed)
Encounter complete. 

## 2015-03-31 ENCOUNTER — Ambulatory Visit (HOSPITAL_COMMUNITY)
Admission: RE | Admit: 2015-03-31 | Discharge: 2015-03-31 | Disposition: A | Discharge status: Home / Self Care | Payer: BLUE CROSS/BLUE SHIELD | Source: Ambulatory Visit | Attending: Cardiology | Admitting: Cardiology

## 2015-03-31 DIAGNOSIS — I714 Abdominal aortic aneurysm, without rupture: Secondary | ICD-10-CM | POA: Diagnosis not present

## 2015-03-31 DIAGNOSIS — R5383 Other fatigue: Secondary | ICD-10-CM | POA: Insufficient documentation

## 2015-03-31 DIAGNOSIS — R42 Dizziness and giddiness: Secondary | ICD-10-CM | POA: Insufficient documentation

## 2015-03-31 DIAGNOSIS — R9439 Abnormal result of other cardiovascular function study: Secondary | ICD-10-CM | POA: Insufficient documentation

## 2015-03-31 DIAGNOSIS — Z6832 Body mass index (BMI) 32.0-32.9, adult: Secondary | ICD-10-CM | POA: Insufficient documentation

## 2015-03-31 DIAGNOSIS — I739 Peripheral vascular disease, unspecified: Secondary | ICD-10-CM | POA: Diagnosis not present

## 2015-03-31 DIAGNOSIS — Z8249 Family history of ischemic heart disease and other diseases of the circulatory system: Secondary | ICD-10-CM | POA: Insufficient documentation

## 2015-03-31 DIAGNOSIS — E669 Obesity, unspecified: Secondary | ICD-10-CM | POA: Insufficient documentation

## 2015-03-31 DIAGNOSIS — R0609 Other forms of dyspnea: Secondary | ICD-10-CM | POA: Insufficient documentation

## 2015-03-31 DIAGNOSIS — R072 Precordial pain: Secondary | ICD-10-CM

## 2015-03-31 DIAGNOSIS — I119 Hypertensive heart disease without heart failure: Secondary | ICD-10-CM | POA: Insufficient documentation

## 2015-03-31 LAB — MYOCARDIAL PERFUSION IMAGING
LV dias vol: 85 mL (ref 46–106)
LV sys vol: 45 mL
Peak HR: 85 {beats}/min
Rest HR: 56 {beats}/min
SDS: 0
SRS: 0
SSS: 0
TID: 1.3

## 2015-03-31 MED ORDER — TECHNETIUM TC 99M SESTAMIBI GENERIC - CARDIOLITE
10.3000 | Freq: Once | INTRAVENOUS | Status: AC | PRN
  Administered 2015-03-31: 10 via INTRAVENOUS

## 2015-03-31 MED ORDER — TECHNETIUM TC 99M SESTAMIBI GENERIC - CARDIOLITE
29.2000 | Freq: Once | INTRAVENOUS | Status: AC | PRN
  Administered 2015-03-31: 29.2 via INTRAVENOUS

## 2015-03-31 MED ORDER — REGADENOSON 0.4 MG/5ML IV SOLN
0.4000 mg | Freq: Once | INTRAVENOUS | Status: AC
  Administered 2015-03-31: 0.4 mg via INTRAVENOUS

## 2015-04-06 ENCOUNTER — Other Ambulatory Visit: Payer: Self-pay | Admitting: Nurse Practitioner

## 2015-04-06 ENCOUNTER — Ambulatory Visit (INDEPENDENT_AMBULATORY_CARE_PROVIDER_SITE_OTHER): Payer: BLUE CROSS/BLUE SHIELD | Admitting: Nurse Practitioner

## 2015-04-06 ENCOUNTER — Ambulatory Visit
Admission: RE | Admit: 2015-04-06 | Discharge: 2015-04-06 | Disposition: A | Discharge status: Home / Self Care | Payer: BLUE CROSS/BLUE SHIELD | Source: Ambulatory Visit | Attending: Nurse Practitioner | Admitting: Nurse Practitioner

## 2015-04-06 ENCOUNTER — Encounter: Payer: Self-pay | Admitting: Nurse Practitioner

## 2015-04-06 VITALS — BP 130/70 | HR 60 | Ht 65.0 in | Wt 192.1 lb

## 2015-04-06 DIAGNOSIS — I10 Essential (primary) hypertension: Secondary | ICD-10-CM | POA: Diagnosis not present

## 2015-04-06 DIAGNOSIS — R0789 Other chest pain: Secondary | ICD-10-CM | POA: Diagnosis not present

## 2015-04-06 LAB — CBC
HCT: 38.8 % (ref 36.0–46.0)
Hemoglobin: 13.4 g/dL (ref 12.0–15.0)
MCH: 29.9 pg (ref 26.0–34.0)
MCHC: 34.5 g/dL (ref 30.0–36.0)
MCV: 86.6 fL (ref 78.0–100.0)
MPV: 9.5 fL (ref 8.6–12.4)
Platelets: 217 10*3/uL (ref 150–400)
RBC: 4.48 MIL/uL (ref 3.87–5.11)
RDW: 14.1 % (ref 11.5–15.5)
WBC: 9.6 10*3/uL (ref 4.0–10.5)

## 2015-04-06 LAB — HEPATIC FUNCTION PANEL
ALT: 12 U/L (ref 6–29)
AST: 14 U/L (ref 10–35)
Albumin: 4.1 g/dL (ref 3.6–5.1)
Alkaline Phosphatase: 91 U/L (ref 33–130)
Bilirubin, Direct: 0.1 mg/dL (ref <=0.2)
Indirect Bilirubin: 0.2 mg/dL (ref 0.2–1.2)
Total Bilirubin: 0.3 mg/dL (ref 0.2–1.2)
Total Protein: 6.8 g/dL (ref 6.1–8.1)

## 2015-04-06 LAB — LIPID PANEL
Cholesterol: 191 mg/dL (ref 125–200)
HDL: 52 mg/dL (ref >=46)
LDL Cholesterol: 106 mg/dL (ref <130)
Total CHOL/HDL Ratio: 3.7 Ratio (ref <=5.0)
Triglycerides: 165 mg/dL — ABNORMAL HIGH (ref <150)
VLDL: 33 mg/dL — ABNORMAL HIGH (ref <30)

## 2015-04-06 LAB — BASIC METABOLIC PANEL
BUN: 21 mg/dL (ref 7–25)
CO2: 25 mmol/L (ref 20–31)
Calcium: 9.2 mg/dL (ref 8.6–10.4)
Chloride: 105 mmol/L (ref 98–110)
Creat: 1.08 mg/dL — ABNORMAL HIGH (ref 0.50–0.99)
Glucose, Bld: 100 mg/dL — ABNORMAL HIGH (ref 65–99)
Potassium: 4.1 mmol/L (ref 3.5–5.3)
Sodium: 140 mmol/L (ref 135–146)

## 2015-04-06 NOTE — Patient Instructions (Addendum)
We will be checking the following labs today - BMET, CBC, PT, PTT, lipids and HPF  Please go to [facility] to [place] on the first floor for a chest Xray - you may walk in.    Medication Instructions:    Continue with your current medicines.     Testing/Procedures To Be Arranged:  Cardiac catheterization   Other Special Instructions:  Your provider has recommended a cardiac catherization  You are scheduled for a cardiac catheterization on Friday, March 31st at 39 noonwith Dr. Irish Lack or associate.  Go to [facility] 2nd Floor Short Stay on Friday, March 31st at [place] thru the [employer] entrance A No food or drink after midnight on Thursday. You may take your medications with a sip of water on the day of your procedure.   Coronary Angiogram A coronary angiogram, also called coronary angiography, is an X-ray procedure used to look at the arteries in the heart. In this procedure, a dye (contrast dye) is injected through a long, hollow tube (catheter). The catheter is about the size of a piece of cooked spaghetti and is inserted through your groin, wrist, or arm. The dye is injected into each artery, and X-rays are then taken to show if there is a blockage in the arteries of your heart.  LET [facility] CARE PROVIDER KNOW ABOUT: Any allergies you have, including allergies to shellfish or contrast dye.  All medicines you are taking, including vitamins, herbs, eye drops, creams, and over-the-counter medicines.  Previous problems you or members of your family have had with the use of anesthetics.  Any blood disorders you have.  Previous surgeries you have had. History of kidney problems or failure.  Other medical conditions you have.  RISKS AND COMPLICATIONS  Generally, a coronary angiogram is a safe procedure. However, about 1 person out of 1000 can have problems that may include: Allergic reaction to the dye. Bleeding/bruising from the access site  or other locations. Kidney injury, especially in people with impaired kidney function. Stroke (rare). Heart attack (rare). Irregular rhythms (rare) Death (rare)  BEFORE THE PROCEDURE  Do not eat or drink anything after midnight the night before the procedure or as directed by your health care provider.  Ask your health care provider about changing or stopping your regular medicines. This is especially important if you are taking diabetes medicines or blood thinners.  PROCEDURE You may be given a medicine to help you relax (sedative) before the procedure. This medicine is given through an intravenous (IV) access tube that is inserted into one of your veins.  The area where the catheter will be inserted will be washed and shaved. This is usually done in the groin but may be done in the fold of your arm (near your elbow) or in the wrist.  A medicine will be given to numb the area where the catheter will be inserted (local anesthetic).  The health care provider will insert the catheter into an artery. The catheter will be guided by using a special type of X-ray (fluoroscopy) of the blood vessel being examined.  A special dye will then be injected into the catheter, and X-rays will be taken. The dye will help to show where any narrowing or blockages are located in the heart arteries.    AFTER THE PROCEDURE  If the procedure is done through the leg, you will be kept in bed lying flat for several hours. You will be instructed to not bend or cross  your legs. The insertion site will be checked frequently.  The pulse in your feet or wrist will be checked frequently.  Additional blood tests, X-rays, and an electrocardiogram may be done.     If you need a refill on your cardiac medications before your next appointment, please call your pharmacy.   Call the [place] office at [phone] if you have any questions, problems or concerns.

## 2015-04-06 NOTE — Progress Notes (Signed)
CARDIOLOGY OFFICE NOTE  Date:  04/06/2015    Janet Johnston Date of Birth: [birth year] Medical Record [id number]  PCP:  Jani Gravel, MD  Cardiologist:  Stanford Breed    Chief Complaint  Patient presents with  . Chest Pain    Work in visit - seen for Dr. Stanford Breed    History of Present Illness: Janet Johnston is a 65 y.o. female who presents today for a work in visit/possible pre cath visit. Seen for Dr. Stanford Breed.   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 Follow-up recommended 5 years. She has had intermittent chest pain for greater than one year. It is substernal with radiation to her left shoulder. It occurs with stress and at rest. It lasts minutes and resolves spontaneously. Saw Dr. Stanford Breed and was referred for stress testing.   Her other issues include RA, HTN, COPD, borderline DM and ongoing tobacco abuse. She is not on any lipid lowering agents.  Comes in today. Here with her husband. Her chest pain is unchanged. She notes she is anxious. Very tearful during the exam. She is smoking 1/2 pack per day but previously smoked more and has smoked for over 40 years. Her FH is strikingly + for CAD.   Past Medical History  Diagnosis Date  . Hypertension   . Rheumatoid arthritis ([place])   . COPD (chronic obstructive pulmonary disease) ([place])   . Nephrolithiasis   . GERD (gastroesophageal reflux disease)   . Fibromyalgia   . DJD (degenerative joint disease)     Past Surgical History  Procedure Laterality Date  . Lower back       Medications: Current Outpatient Prescriptions  Medication Sig Dispense Refill  . amLODipine (NORVASC) 10 MG tablet Take 1 tablet (10 mg total) by mouth daily. 90 tablet 3  . bisoprolol (ZEBETA) 5 MG tablet Take 5 mg by mouth at bedtime.    . ergocalciferol (VITAMIN D2) 50000 UNITS capsule Take 50,000 Units by mouth every Friday.    [name] FLUoxetine (PROZAC) 10 MG tablet Take 40  mg by mouth every morning.     [name] losartan (COZAAR) 25 MG tablet Take 50 mg by mouth at bedtime.     . traMADol (ULTRAM) 50 MG tablet Take 50 mg by mouth every 6 (six) hours as needed for severe pain.     No current facility-administered medications for this visit.    Allergies: Allergies  Allergen Reactions  . Lyrica [Pregabalin] Itching and Rash  . Penicillins Itching and Rash  . Sulfa Antibiotics Itching and Rash    Social History: The patient  reports that she has been smoking.  She does not have any smokeless tobacco history on file. She reports that she drinks alcohol. She reports that she does not use illicit drugs.   Family History: The patient's family history includes Diabetes in her mother; Emphysema in her father; Heart disease in her mother; Heart failure in her father and mother; Hypertension in her mother.   Review of Systems: Please see the history of present illness.   Otherwise, the review of systems is positive for none.   All other systems are reviewed and negative.   Physical Exam: VS:  BP 130/70 mmHg  Pulse 60  Ht 5\' 5"  (1.651 m)  Wt 192 lb 1.9 oz (87.145 kg)  BMI 31.97 kg/m2 .  BMI Body mass index is 31.97 kg/(m^2).  Wt Readings from Last 3  Encounters:  04/06/15 192 lb 1.9 oz (87.145 kg)  03/31/15 192 lb (87.091 kg)  03/22/15 192 lb (87.091 kg)    General: She is quite tearful. She is alert and in no acute distress.  HEENT: Normal. Neck: Supple, no JVD, carotid bruits, or masses noted.  Cardiac: Regular rate and rhythm. No murmurs, rubs, or gallops. No edema.  Respiratory:  Lungs are clear to auscultation bilaterally with normal work of breathing.  GI: Soft and nontender.  MS: No deformity or atrophy. Gait and ROM intact. Skin: Warm and dry. Color is normal.  Neuro:  Strength and sensation are intact and no gross focal deficits noted.  Psych: Alert, appropriate and with normal affect.   LABORATORY DATA:  EKG:  EKG is not ordered today.  Lab  Results  Component Value Date   WBC 10.6* 05/04/2008   HGB 13.1 05/04/2008   HCT 37.5 05/04/2008   PLT 211 05/04/2008   GLUCOSE 139* 05/04/2008   NA 138 05/04/2008   K 4.1 05/04/2008   CL 105 05/04/2008   CREATININE 1.10 05/04/2008   BUN 17 05/04/2008   CO2 31 05/04/2008    BNP (last 3 results) No results for input(s): BNP in the last 8760 hours.  ProBNP (last 3 results) No results for input(s): PROBNP in the last 8760 hours.   Other Studies Reviewed Today: Study Highlights Myoview 03/2015    The left ventricular ejection fraction is mildly decreased (45-54%).  Nuclear stress EF: 46%.  No T wave inversion was noted during stress.  There was no ST segment deviation noted during stress.  Defect 1: There is a large defect of moderate severity.  Findings consistent with ischemia.  This is an intermediate risk study.  TID 1.3  Large, moderate intensity reversible anteroapical perfusion defect (qualitatively - summed stress and rest scores were either zero or not reported), suggestive of ischemia. The LVEF is 46% with mid to distal anterior and apical hypokinesis. Abnormal TID ratio of 1.3. This is an intermediate risk study.      Assessment/Plan: 1. Chest pain - abnormal Myoview - she has multiple CV risk factors - cardiac catheterization is recommended. The patient understands that risks include but are not limited to stroke (1 in 1000), death (1 in 26), kidney failure [usually temporary] (1 in 500), bleeding (1 in 200), allergic reaction [possibly serious] (1 in 200), and agrees to proceed. Scheduled for this Friday.   2. HTN - BP ok on current regimen  3. Tobacco abuse - she understands the need to stop - not sure if she is ready yet  4. ?HLD - checking lab today  5. COPD - CXR today.   6. Obesity.     Current medicines are reviewed with the patient today.  The patient does not have concerns regarding medicines other than what has been noted  above.  The following changes have been made:  See above.  Labs/ tests ordered today include:    Orders Placed This Encounter  Procedures  . DG Chest 2 View  . Basic metabolic panel  . CBC  . Hepatic function panel  . Lipid panel  . APTT  . Protime-INR     Disposition:   Further disposition pending. Cardiac cath is arranged for this Friday with Dr. Irish Lack.  Patient is agreeable to this plan and will call if any problems develop in the interim.   Signed: Burtis Junes, RN, ANP-C 04/06/2015 3:37 PM  [facility]  [address] [place], [place]  [address] Phone: [phone] Fax: [phone]

## 2015-04-07 LAB — PROTIME-INR
INR: 0.92 (ref <1.50)
Prothrombin Time: 12.5 seconds (ref 11.6–15.2)

## 2015-04-07 LAB — APTT: aPTT: 33 seconds (ref 24–37)

## 2015-04-08 ENCOUNTER — Encounter (HOSPITAL_COMMUNITY)
Admission: RE | Disposition: A | Discharge status: Home / Self Care | Payer: Self-pay | Source: Ambulatory Visit | Attending: Interventional Cardiology

## 2015-04-08 ENCOUNTER — Ambulatory Visit (HOSPITAL_COMMUNITY)
Admission: RE | Admit: 2015-04-08 | Discharge: 2015-04-08 | Disposition: A | Discharge status: Home / Self Care | Payer: BLUE CROSS/BLUE SHIELD | Source: Ambulatory Visit | Attending: Interventional Cardiology | Admitting: Interventional Cardiology

## 2015-04-08 DIAGNOSIS — Z8249 Family history of ischemic heart disease and other diseases of the circulatory system: Secondary | ICD-10-CM | POA: Diagnosis not present

## 2015-04-08 DIAGNOSIS — M1990 Unspecified osteoarthritis, unspecified site: Secondary | ICD-10-CM | POA: Diagnosis not present

## 2015-04-08 DIAGNOSIS — J449 Chronic obstructive pulmonary disease, unspecified: Secondary | ICD-10-CM | POA: Insufficient documentation

## 2015-04-08 DIAGNOSIS — M797 Fibromyalgia: Secondary | ICD-10-CM | POA: Insufficient documentation

## 2015-04-08 DIAGNOSIS — M069 Rheumatoid arthritis, unspecified: Secondary | ICD-10-CM | POA: Diagnosis not present

## 2015-04-08 DIAGNOSIS — R9439 Abnormal result of other cardiovascular function study: Secondary | ICD-10-CM | POA: Diagnosis present

## 2015-04-08 DIAGNOSIS — I25119 Atherosclerotic heart disease of native coronary artery with unspecified angina pectoris: Secondary | ICD-10-CM

## 2015-04-08 DIAGNOSIS — Z6831 Body mass index (BMI) 31.0-31.9, adult: Secondary | ICD-10-CM | POA: Diagnosis not present

## 2015-04-08 DIAGNOSIS — R7309 Other abnormal glucose: Secondary | ICD-10-CM | POA: Insufficient documentation

## 2015-04-08 DIAGNOSIS — F17210 Nicotine dependence, cigarettes, uncomplicated: Secondary | ICD-10-CM | POA: Diagnosis not present

## 2015-04-08 DIAGNOSIS — Z880 Allergy status to penicillin: Secondary | ICD-10-CM | POA: Insufficient documentation

## 2015-04-08 DIAGNOSIS — I2510 Atherosclerotic heart disease of native coronary artery without angina pectoris: Secondary | ICD-10-CM | POA: Diagnosis present

## 2015-04-08 DIAGNOSIS — I10 Essential (primary) hypertension: Secondary | ICD-10-CM | POA: Diagnosis present

## 2015-04-08 DIAGNOSIS — I209 Angina pectoris, unspecified: Secondary | ICD-10-CM | POA: Diagnosis present

## 2015-04-08 DIAGNOSIS — E669 Obesity, unspecified: Secondary | ICD-10-CM | POA: Diagnosis not present

## 2015-04-08 DIAGNOSIS — K219 Gastro-esophageal reflux disease without esophagitis: Secondary | ICD-10-CM | POA: Insufficient documentation

## 2015-04-08 HISTORY — PX: CARDIAC CATHETERIZATION: SHX172

## 2015-04-08 LAB — POCT ACTIVATED CLOTTING TIME: Activated Clotting Time: 270 seconds

## 2015-04-08 SURGERY — LEFT HEART CATH AND CORONARY ANGIOGRAPHY

## 2015-04-08 MED ORDER — IOPAMIDOL (ISOVUE-370) INJECTION 76%
INTRAVENOUS | Status: AC
  Filled 2015-04-08: qty 100

## 2015-04-08 MED ORDER — ADENOSINE 12 MG/4ML IV SOLN
16.0000 mL | Freq: Once | INTRAVENOUS | Status: AC
  Administered 2015-04-08: 48 mg via INTRAVENOUS
  Administered 2015-04-08: 12 mg via INTRAVENOUS
  Filled 2015-04-08: qty 16

## 2015-04-08 MED ORDER — ACETAMINOPHEN 325 MG PO TABS: 650.0000 mg | ORAL_TABLET | ORAL | Status: DC | PRN

## 2015-04-08 MED ORDER — HEPARIN (PORCINE) IN NACL 2-0.9 UNIT/ML-% IJ SOLN
INTRAMUSCULAR | Status: AC
  Filled 2015-04-08: qty 500

## 2015-04-08 MED ORDER — LIDOCAINE HCL (PF) 1 % IJ SOLN
INTRAMUSCULAR | Status: DC | PRN
  Administered 2015-04-08: 2 mL

## 2015-04-08 MED ORDER — MIDAZOLAM HCL 2 MG/2ML IJ SOLN
INTRAMUSCULAR | Status: AC
Start: 2015-04-08 — End: 2015-04-08
  Filled 2015-04-08: qty 2

## 2015-04-08 MED ORDER — ASPIRIN 81 MG PO CHEW: 81.0000 mg | CHEWABLE_TABLET | ORAL | Status: DC

## 2015-04-08 MED ORDER — FENTANYL CITRATE (PF) 100 MCG/2ML IJ SOLN
INTRAMUSCULAR | Status: DC | PRN
  Administered 2015-04-08 (×2): 25 ug via INTRAVENOUS

## 2015-04-08 MED ORDER — SODIUM CHLORIDE 0.9% FLUSH: 3.0000 mL | Freq: Two times a day (BID) | INTRAVENOUS | Status: DC

## 2015-04-08 MED ORDER — ASPIRIN 81 MG PO CHEW
CHEWABLE_TABLET | ORAL | Status: AC
  Filled 2015-04-08: qty 1

## 2015-04-08 MED ORDER — MORPHINE SULFATE (PF) 2 MG/ML IV SOLN: 2.0000 mg | INTRAVENOUS | Status: DC | PRN

## 2015-04-08 MED ORDER — FENTANYL CITRATE (PF) 100 MCG/2ML IJ SOLN
INTRAMUSCULAR | Status: AC
  Filled 2015-04-08: qty 2

## 2015-04-08 MED ORDER — ONDANSETRON HCL 4 MG/2ML IJ SOLN: 4.0000 mg | Freq: Four times a day (QID) | INTRAMUSCULAR | Status: DC | PRN

## 2015-04-08 MED ORDER — DIAZEPAM 5 MG PO TABS: 10.0000 mg | ORAL_TABLET | ORAL | Status: DC

## 2015-04-08 MED ORDER — MIDAZOLAM HCL 2 MG/2ML IJ SOLN
INTRAMUSCULAR | Status: DC | PRN
  Administered 2015-04-08 (×2): 1 mg via INTRAVENOUS

## 2015-04-08 MED ORDER — IOPAMIDOL (ISOVUE-370) INJECTION 76%
INTRAVENOUS | Status: DC | PRN
  Administered 2015-04-08: 90 mL via INTRAVENOUS

## 2015-04-08 MED ORDER — HEPARIN (PORCINE) IN NACL 2-0.9 UNIT/ML-% IJ SOLN
INTRAMUSCULAR | Status: AC
  Filled 2015-04-08: qty 1000

## 2015-04-08 MED ORDER — HEPARIN SODIUM (PORCINE) 1000 UNIT/ML IJ SOLN
INTRAMUSCULAR | Status: AC
  Filled 2015-04-08: qty 1

## 2015-04-08 MED ORDER — SODIUM CHLORIDE 0.9% FLUSH: 3.0000 mL | INTRAVENOUS | Status: DC | PRN

## 2015-04-08 MED ORDER — SODIUM CHLORIDE 0.9 % IV SOLN: 250.0000 mL | INTRAVENOUS | Status: DC | PRN

## 2015-04-08 MED ORDER — ASPIRIN 81 MG PO CHEW
81.0000 mg | CHEWABLE_TABLET | ORAL | Status: AC
  Administered 2015-04-08: 81 mg via ORAL

## 2015-04-08 MED ORDER — SODIUM CHLORIDE 0.9 % IV SOLN
INTRAVENOUS | Status: DC
  Administered 2015-04-08: 999 mL/h via INTRAVENOUS
  Administered 2015-04-08: 11:00:00 via INTRAVENOUS

## 2015-04-08 MED ORDER — LIDOCAINE HCL (PF) 1 % IJ SOLN
INTRAMUSCULAR | Status: AC
  Filled 2015-04-08: qty 30

## 2015-04-08 MED ORDER — HEPARIN SODIUM (PORCINE) 1000 UNIT/ML IJ SOLN
INTRAMUSCULAR | Status: DC | PRN
  Administered 2015-04-08: 4500 [IU] via INTRAVENOUS
  Administered 2015-04-08: 3000 [IU] via INTRAVENOUS

## 2015-04-08 MED ORDER — VERAPAMIL HCL 2.5 MG/ML IV SOLN
INTRAVENOUS | Status: DC | PRN
  Administered 2015-04-08: 10 mL via INTRA_ARTERIAL

## 2015-04-08 MED ORDER — VERAPAMIL HCL 2.5 MG/ML IV SOLN
INTRAVENOUS | Status: AC
  Filled 2015-04-08: qty 2

## 2015-04-08 MED ORDER — SODIUM CHLORIDE 0.9 % WEIGHT BASED INFUSION: 3.0000 mL/kg/h | INTRAVENOUS | Status: AC

## 2015-04-08 MED ORDER — HEPARIN (PORCINE) IN NACL 2-0.9 UNIT/ML-% IJ SOLN
INTRAMUSCULAR | Status: DC | PRN
  Administered 2015-04-08: 1000 mL
  Administered 2015-04-08: 500 mL

## 2015-04-08 SURGICAL SUPPLY — 14 items
CATH INFINITI 5FR ANG PIGTAIL (CATHETERS) ×2 IMPLANT
CATH MICROCATH NAVVUS (MICROCATHETER) ×1 IMPLANT
CATH OPTITORQUE TIG 4.0 5F (CATHETERS) ×2 IMPLANT
CATH VISTA GUIDE 6FR XBLAD3.5 (CATHETERS) ×2 IMPLANT
GLIDESHEATH SLEND A-KIT 6F 22G (SHEATH) ×2 IMPLANT
KIT ESSENTIALS PG (KITS) ×2 IMPLANT
KIT HEART LEFT (KITS) ×2 IMPLANT
MICROCATHETER NAVVUS (MICROCATHETER) ×2
PACK CARDIAC CATHETERIZATION (CUSTOM PROCEDURE TRAY) ×2 IMPLANT
SYR MEDRAD MARK V 150ML (SYRINGE) IMPLANT
TRANSDUCER W/STOPCOCK (MISCELLANEOUS) ×2 IMPLANT
TUBING CIL FLEX 10 FLL-RA (TUBING) ×2 IMPLANT
WIRE HI TORQ BMW 190CM (WIRE) ×2 IMPLANT
WIRE J 3MM .035X260CM (WIRE) ×2 IMPLANT

## 2015-04-08 NOTE — Interval H&P Note (Signed)
History and Physical Interval Note:  04/08/2015 1:40 PM  Janet Johnston  has presented today for surgery, with the diagnosis of abnormal mioview -with likely class III angina symptoms  The various methods of treatment have been discussed with the patient and family. After consideration of risks, benefits and other options for treatment, the patient has consented to  Procedure(s): Left Heart Cath and Coronary Angiography (N/A) With Possible PercutaneousCoronary Intervention as a surgical intervention .  The patient's history has been reviewed, patient examined, no change in status, stable for surgery.  I have reviewed the patient's chart and labs.  Questions were answered to the patient's satisfaction.    Cath Lab Visit (complete for each Cath Lab visit)  Clinical Evaluation Leading to the Procedure:   ACS: No.  Non-ACS:    Anginal Classification: CCS III  Anti-ischemic medical therapy: Maximal Therapy (2 or more classes of medications)  Non-Invasive Test Results: High-risk stress test findings: cardiac mortality >3%/year  Prior CABG: No previous CABG  Ischemic Symptoms? CCS III (Marked limitation of ordinary activity) Anti-ischemic Medical Therapy? Maximal Medical Therapy (2 or more classes of medications) Non-invasive Test Results? High-risk stress test findings: cardiac mortality >3%/yr Prior CABG? No Previous CABG   Patient Information:   1-2V CAD, no prox LAD  A (9)  Indication: 19; Score: 9   Patient Information:   CTO of 1 vessel, no other CAD  A (8)  Indication: 29; Score: 8   Patient Information:   1V CAD with prox LAD  A (9)  Indication: 35; Score: 9   Patient Information:   2V-CAD with prox LAD  A (9)  Indication: 41; Score: 9   Patient Information:   3V-CAD without LMCA  A (9)  Indication: 47; Score: 9   Patient Information:   3V-CAD without LMCA With Abnormal LV systolic function  A (9)  Indication: 48; Score: 9   Patient  Information:   LMCA-CAD  A (9)  Indication: 49; Score: 9   Patient Information:   2V-CAD with prox LAD PCI  A (7)  Indication: 62; Score: 7   Patient Information:   2V-CAD with prox LAD CABG  A (8)  Indication: 62; Score: 8   Patient Information:   3V-CAD without LMCA With Low CAD burden(i.e., 3 focal stenoses, low SYNTAX score) PCI  A (7)  Indication: 63; Score: 7   Patient Information:   3V-CAD without LMCA With Low CAD burden(i.e., 3 focal stenoses, low SYNTAX score) CABG  A (9)  Indication: 63; Score: 9   Patient Information:   3V-CAD without LMCA E06c - Intermediate-high CAD burden (i.e., multiple diffuse lesions, presence of CTO, or high SYNTAX score) PCI  U (4)  Indication: 64; Score: 4   Patient Information:   3V-CAD without LMCA E06c - Intermediate-high CAD burden (i.e., multiple diffuse lesions, presence of CTO, or high SYNTAX score) CABG  A (9)  Indication: 64; Score: 9   Patient Information:   LMCA-CAD With Isolated LMCA stenosis  PCI  U (6)  Indication: 65; Score: 6   Patient Information:   LMCA-CAD With Isolated LMCA stenosis  CABG  A (9)  Indication: 65; Score: 9   Patient Information:   LMCA-CAD Additional CAD, low CAD burden (i.e., 1- to 2-vessel additional involvement, low SYNTAX score) PCI  U (5)  Indication: 66; Score: 5   Patient Information:   LMCA-CAD Additional CAD, low CAD burden (i.e., 1- to 2-vessel additional involvement, low SYNTAX score) CABG  A (9)  Indication: 66; Score: 9   Patient Information:   LMCA-CAD Additional CAD, intermediate-high CAD burden (i.e., 3-vessel involvement, presence of CTO, or high SYNTAX score) PCI  I (3)  Indication: 67; Score: 3   Patient Information:   LMCA-CAD Additional CAD, intermediate-high CAD burden (i.e., 3-vessel involvement, presence of CTO, or high SYNTAX score) CABG  A (9)  Indication: 67; Score: 9    Godwin Tedesco W

## 2015-04-08 NOTE — Discharge Instructions (Signed)
Radial Site Care °Refer to this sheet in the next few weeks. These instructions provide you with information about caring for yourself after your procedure. Your health care provider may also give you more specific instructions. Your treatment has been planned according to current medical practices, but problems sometimes occur. Call your health care provider if you have any problems or questions after your procedure. °WHAT TO EXPECT AFTER THE PROCEDURE °After your procedure, it is typical to have the following: °· Bruising at the radial site that usually fades within 1-2 weeks. °· Blood collecting in the tissue (hematoma) that may be painful to the touch. It should usually decrease in size and tenderness within 1-2 weeks. °HOME CARE INSTRUCTIONS °· Take medicines only as directed by your health care provider. °· You may shower 24-48 hours after the procedure or as directed by your health care provider. Remove the bandage (dressing) and gently wash the site with plain soap and water. Pat the area dry with a clean towel. Do not rub the site, because this may cause bleeding. °· Do not take baths, swim, or use a hot tub until your health care provider approves. °· Check your insertion site every day for redness, swelling, or drainage. °· Do not apply powder or lotion to the site. °· Do not flex or bend the affected arm for 24 hours or as directed by your health care provider. °· Do not push or pull heavy objects with the affected arm for 24 hours or as directed by your health care provider. °· Do not lift over 10 lb (4.5 kg) for 5 days after your procedure or as directed by your health care provider. °· Ask your health care provider when it is okay to: °¨ Return to work or school. °¨ Resume usual physical activities or sports. °¨ Resume sexual activity. °· Do not drive home if you are discharged the same day as the procedure. Have someone else drive you. °· You may drive 24 hours after the procedure unless otherwise  instructed by your health care provider. °· Do not operate machinery or power tools for 24 hours after the procedure. °· If your procedure was done as an outpatient procedure, which means that you went home the same day as your procedure, a responsible adult should be with you for the first 24 hours after you arrive home. °· Keep all follow-up visits as directed by your health care provider. This is important. °SEEK MEDICAL CARE IF: °· You have a fever. °· You have chills. °· You have increased bleeding from the radial site. Hold pressure on the site. °SEEK IMMEDIATE MEDICAL CARE IF: °· You have unusual pain at the radial site. °· You have redness, warmth, or swelling at the radial site. °· You have drainage (other than a small amount of blood on the dressing) from the radial site. °· The radial site is bleeding, and the bleeding does not stop after 30 minutes of holding steady pressure on the site. °· Your arm or hand becomes pale, cool, tingly, or numb. °  °This information is not intended to replace advice given to you by your health care provider. Make sure you discuss any questions you have with your health care provider. °  °Document Released: 01/27/2010 Document Revised: 01/15/2014 Document Reviewed: 07/13/2013 °Elsevier Interactive Patient Education ©2016 [employer] ° °

## 2015-04-08 NOTE — H&P (View-Only) (Signed)
CARDIOLOGY OFFICE NOTE  Date:  04/06/2015    Janet Johnston Date of Birth: 1950/07/14 Medical Record [id number]  PCP:  Jani Gravel, MD  Cardiologist:  Stanford Breed    Chief Complaint  Patient presents with  . Chest Pain    Work in visit - seen for Dr. Stanford Breed    History of Present Illness: Janet Johnston is a 65 y.o. female who presents today for a work in visit/possible pre cath visit. Seen for Dr. Stanford Breed.   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 Follow-up recommended 5 years. She has had intermittent chest pain for greater than one year. It is substernal with radiation to her left shoulder. It occurs with stress and at rest. It lasts minutes and resolves spontaneously. Saw Dr. Stanford Breed and was referred for stress testing.   Her other issues include RA, HTN, COPD, borderline DM and ongoing tobacco abuse. She is not on any lipid lowering agents.  Comes in today. Here with her husband. Her chest pain is unchanged. She notes she is anxious. Very tearful during the exam. She is smoking 1/2 pack per day but previously smoked more and has smoked for over 40 years. Her FH is strikingly + for CAD.   Past Medical History  Diagnosis Date  . Hypertension   . Rheumatoid arthritis ([place])   . COPD (chronic obstructive pulmonary disease) ([place])   . Nephrolithiasis   . GERD (gastroesophageal reflux disease)   . Fibromyalgia   . DJD (degenerative joint disease)     Past Surgical History  Procedure Laterality Date  . Lower back       Medications: Current Outpatient Prescriptions  Medication Sig Dispense Refill  . amLODipine (NORVASC) 10 MG tablet Take 1 tablet (10 mg total) by mouth daily. 90 tablet 3  . bisoprolol (ZEBETA) 5 MG tablet Take 5 mg by mouth at bedtime.    . ergocalciferol (VITAMIN D2) 50000 UNITS capsule Take 50,000 Units by mouth every Friday.    [name] FLUoxetine (PROZAC) 10 MG tablet Take 40  mg by mouth every morning.     [name] losartan (COZAAR) 25 MG tablet Take 50 mg by mouth at bedtime.     . traMADol (ULTRAM) 50 MG tablet Take 50 mg by mouth every 6 (six) hours as needed for severe pain.     No current facility-administered medications for this visit.    Allergies: Allergies  Allergen Reactions  . Lyrica [Pregabalin] Itching and Rash  . Penicillins Itching and Rash  . Sulfa Antibiotics Itching and Rash    Social History: The patient  reports that she has been smoking.  She does not have any smokeless tobacco history on file. She reports that she drinks alcohol. She reports that she does not use illicit drugs.   Family History: The patient's family history includes Diabetes in her mother; Emphysema in her father; Heart disease in her mother; Heart failure in her father and mother; Hypertension in her mother.   Review of Systems: Please see the history of present illness.   Otherwise, the review of systems is positive for none.   All other systems are reviewed and negative.   Physical Exam: VS:  BP 130/70 mmHg  Pulse 60  Ht 5\' 5"  (1.651 m)  Wt 192 lb 1.9 oz (87.145 kg)  BMI 31.97 kg/m2 .  BMI Body mass index is 31.97 kg/(m^2).  Wt Readings from Last 3  Encounters:  04/06/15 192 lb 1.9 oz (87.145 kg)  03/31/15 192 lb (87.091 kg)  03/22/15 192 lb (87.091 kg)    General: She is quite tearful. She is alert and in no acute distress.  HEENT: Normal. Neck: Supple, no JVD, carotid bruits, or masses noted.  Cardiac: Regular rate and rhythm. No murmurs, rubs, or gallops. No edema.  Respiratory:  Lungs are clear to auscultation bilaterally with normal work of breathing.  GI: Soft and nontender.  MS: No deformity or atrophy. Gait and ROM intact. Skin: Warm and dry. Color is normal.  Neuro:  Strength and sensation are intact and no gross focal deficits noted.  Psych: Alert, appropriate and with normal affect.   LABORATORY DATA:  EKG:  EKG is not ordered today.  Lab  Results  Component Value Date   WBC 10.6* 05/04/2008   HGB 13.1 05/04/2008   HCT 37.5 05/04/2008   PLT 211 05/04/2008   GLUCOSE 139* 05/04/2008   NA 138 05/04/2008   K 4.1 05/04/2008   CL 105 05/04/2008   CREATININE 1.10 05/04/2008   BUN 17 05/04/2008   CO2 31 05/04/2008    BNP (last 3 results) No results for input(s): BNP in the last 8760 hours.  ProBNP (last 3 results) No results for input(s): PROBNP in the last 8760 hours.   Other Studies Reviewed Today: Study Highlights Myoview 03/2015    The left ventricular ejection fraction is mildly decreased (45-54%).  Nuclear stress EF: 46%.  No T wave inversion was noted during stress.  There was no ST segment deviation noted during stress.  Defect 1: There is a large defect of moderate severity.  Findings consistent with ischemia.  This is an intermediate risk study.  TID 1.3  Large, moderate intensity reversible anteroapical perfusion defect (qualitatively - summed stress and rest scores were either zero or not reported), suggestive of ischemia. The LVEF is 46% with mid to distal anterior and apical hypokinesis. Abnormal TID ratio of 1.3. This is an intermediate risk study.      Assessment/Plan: 1. Chest pain - abnormal Myoview - she has multiple CV risk factors - cardiac catheterization is recommended. The patient understands that risks include but are not limited to stroke (1 in 1000), death (1 in 14), kidney failure [usually temporary] (1 in 500), bleeding (1 in 200), allergic reaction [possibly serious] (1 in 200), and agrees to proceed. Scheduled for this Friday.   2. HTN - BP ok on current regimen  3. Tobacco abuse - she understands the need to stop - not sure if she is ready yet  4. ?HLD - checking lab today  5. COPD - CXR today.   6. Obesity.     Current medicines are reviewed with the patient today.  The patient does not have concerns regarding medicines other than what has been noted  above.  The following changes have been made:  See above.  Labs/ tests ordered today include:    Orders Placed This Encounter  Procedures  . DG Chest 2 View  . Basic metabolic panel  . CBC  . Hepatic function panel  . Lipid panel  . APTT  . Protime-INR     Disposition:   Further disposition pending. Cardiac cath is arranged for this Friday with Dr. Irish Lack.  Patient is agreeable to this plan and will call if any problems develop in the interim.   Signed: Burtis Junes, RN, ANP-C 04/06/2015 3:37 PM  [facility]  [address] [place], [place]  [address] Phone: [phone] Fax: [phone]

## 2015-04-11 ENCOUNTER — Encounter (HOSPITAL_COMMUNITY): Payer: Self-pay | Admitting: Cardiology

## 2015-04-11 ENCOUNTER — Telehealth: Payer: Self-pay | Admitting: Nurse Practitioner

## 2015-04-11 NOTE — Telephone Encounter (Signed)
New Message:   Pt had Cath on Friday,she would like to discuss the results more. Also would like to know what are the next step and does she need to follow up?\

## 2015-04-11 NOTE — Telephone Encounter (Signed)
S/w pt about cath as of right now just keep an eye on the percentage of blockage and the next step would be a follow up with Dr. Stanford Breed to discuss further.  Pt agreeable to plan.  Appointment made for Dr. Stanford Breed today.

## 2015-05-04 ENCOUNTER — Ambulatory Visit: Payer: Self-pay | Admitting: Nurse Practitioner

## 2015-05-05 NOTE — Progress Notes (Signed)
HPI: FU chest pain. 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 Follow-up recommended 5 years. Nuclear study March 2017 showed ejection fraction 46%.There was a large anteroapical reversible perfusion defect suggestive of ischemia. Cardiac catheterization March 2017 showed a 45% first marginal, 45-55% mid LAD and normal LV function. FFR of LAD not significant. Since last seen, She does have some dyspnea on exertion. No orthopnea, PND, palpitations or syncope. Occasional chest discomfort both with exertion and at rest. She wonders whether this may be secondary to rheumatoid arthritis.  Current Outpatient Prescriptions  Medication Sig Dispense Refill  . amLODipine (NORVASC) 10 MG tablet Take 1 tablet (10 mg total) by mouth daily. 90 tablet 3  . bisoprolol (ZEBETA) 5 MG tablet Take 5 mg by mouth at bedtime.    . ergocalciferol (VITAMIN D2) 50000 UNITS capsule Take 50,000 Units by mouth every Saturday.     [name] FLUoxetine (PROZAC) 40 MG capsule Take 40 mg by mouth daily.    [name] losartan (COZAAR) 50 MG tablet Take 50 mg by mouth daily.    . traMADol (ULTRAM) 50 MG tablet Take 50 mg by mouth every 6 (six) hours as needed for severe pain.     No current facility-administered medications for this visit.     Past Medical History  Diagnosis Date  . Hypertension   . Rheumatoid arthritis ([place])   . COPD (chronic obstructive pulmonary disease) ([place])   . Nephrolithiasis   . GERD (gastroesophageal reflux disease)   . Fibromyalgia   . DJD (degenerative joint disease)     Past Surgical History  Procedure Laterality Date  . Lower back    . Cardiac catheterization N/A 04/08/2015    Procedure: Left Heart Cath and Coronary Angiography;  Surgeon: [name], MD;  Location: [facility];  Service: Cardiovascular;  Laterality: N/A;  . Cardiac catheterization N/A 04/08/2015    Procedure: Intravascular Pressure Wire/FFR Study;  Surgeon: [name],  MD;  Location: [facility];  Service: Cardiovascular;  Laterality: N/A;    Social History   Social History  . Marital Status: Married    Spouse Name: N/A  . Number of Children: 3  . Years of Education: N/A   Occupational History  . Not on file.   Social History Main Topics  . Smoking status: Current Every Day Smoker  . Smokeless tobacco: Not on file  . Alcohol Use: 0.0 oz/week    0 Standard drinks or equivalent per week     Comment: Occasional  . Drug Use: No  . Sexual Activity: Not on file   Other Topics Concern  . Not on file   Social History Narrative    Family History  Problem Relation Age of Onset  . Heart failure Mother   . Heart disease Mother   . Diabetes Mother   . Hypertension Mother   . Emphysema Father   . Heart failure Father     CABG, AVR    ROS: fatigue but no fevers or chills, productive cough, hemoptysis, dysphasia, odynophagia, melena, hematochezia, dysuria, hematuria, rash, seizure activity, orthopnea, PND, pedal edema, claudication. Remaining systems are negative.  Physical Exam: Well-developed well-nourished in no acute distress.  Skin is warm and dry.  HEENT is normal.  Neck is supple.  Chest is clear to auscultation with normal expansion.  Cardiovascular exam is regular rate and rhythm.  Abdominal exam nontender or distended. No masses palpated. Extremities show no  edema. neuro grossly intact

## 2015-05-06 ENCOUNTER — Encounter: Payer: Self-pay | Admitting: Cardiology

## 2015-05-06 ENCOUNTER — Ambulatory Visit (INDEPENDENT_AMBULATORY_CARE_PROVIDER_SITE_OTHER): Payer: BLUE CROSS/BLUE SHIELD | Admitting: Cardiology

## 2015-05-06 VITALS — BP 118/72 | HR 72 | Ht 64.0 in | Wt 190.2 lb

## 2015-05-06 DIAGNOSIS — I2510 Atherosclerotic heart disease of native coronary artery without angina pectoris: Secondary | ICD-10-CM

## 2015-05-06 DIAGNOSIS — I10 Essential (primary) hypertension: Secondary | ICD-10-CM | POA: Diagnosis not present

## 2015-05-06 DIAGNOSIS — I2583 Coronary atherosclerosis due to lipid rich plaque: Secondary | ICD-10-CM

## 2015-05-06 DIAGNOSIS — E785 Hyperlipidemia, unspecified: Secondary | ICD-10-CM

## 2015-05-06 DIAGNOSIS — Z720 Tobacco use: Secondary | ICD-10-CM

## 2015-05-06 MED ORDER — ATORVASTATIN CALCIUM 10 MG PO TABS: 10.0000 mg | ORAL_TABLET | Freq: Every day | ORAL | Status: DC

## 2015-05-06 NOTE — Patient Instructions (Signed)
Start Aspirin 81 mg daily  Start Lipitor 10 mg daily with supper  Lab Work ( Lipid and Liver Panels ) in 4 weeks   Your physician wants you to follow-up in: 1 year. You will receive a reminder letter in the mail two months in advance. If you don't receive a letter, please call our office to schedule the follow-up appointment.

## 2015-05-06 NOTE — Assessment & Plan Note (Addendum)
Patient has moderate coronary disease. It is not felt to cause her chest pain. We will treat with aspirin 81 mg daily. Add Lipitor 10 mg daily. Check lipids and liver in 4 weeks.

## 2015-05-06 NOTE — Assessment & Plan Note (Signed)
Blood pressure controlled. Continue present medications. 

## 2015-05-06 NOTE — Assessment & Plan Note (Addendum)
Given moderate coronary disease we will add Lipitor 10 mg daily. Check lipids and liver in 4 weeks. Note she had difficulties with higher dose statins previously.

## 2015-05-06 NOTE — Assessment & Plan Note (Signed)
Patient counseled on discontinuing. 

## 2015-05-06 NOTE — Assessment & Plan Note (Signed)
Follow-up abdominal ultrasound June2021.

## 2015-08-15 DIAGNOSIS — Z0100 Encounter for examination of eyes and vision without abnormal findings: Secondary | ICD-10-CM | POA: Diagnosis not present

## 2015-08-15 DIAGNOSIS — H2513 Age-related nuclear cataract, bilateral: Secondary | ICD-10-CM | POA: Diagnosis not present

## 2015-08-24 DIAGNOSIS — H25811 Combined forms of age-related cataract, right eye: Secondary | ICD-10-CM | POA: Diagnosis not present

## 2015-08-24 DIAGNOSIS — H2511 Age-related nuclear cataract, right eye: Secondary | ICD-10-CM | POA: Diagnosis not present

## 2015-08-31 DIAGNOSIS — Z23 Encounter for immunization: Secondary | ICD-10-CM | POA: Diagnosis not present

## 2015-08-31 DIAGNOSIS — E559 Vitamin D deficiency, unspecified: Secondary | ICD-10-CM | POA: Diagnosis not present

## 2015-08-31 DIAGNOSIS — I10 Essential (primary) hypertension: Secondary | ICD-10-CM | POA: Diagnosis not present

## 2015-08-31 DIAGNOSIS — E7800 Pure hypercholesterolemia, unspecified: Secondary | ICD-10-CM | POA: Diagnosis not present

## 2015-09-02 DIAGNOSIS — E7800 Pure hypercholesterolemia, unspecified: Secondary | ICD-10-CM | POA: Diagnosis not present

## 2015-09-02 DIAGNOSIS — R7303 Prediabetes: Secondary | ICD-10-CM | POA: Diagnosis not present

## 2015-09-02 DIAGNOSIS — E559 Vitamin D deficiency, unspecified: Secondary | ICD-10-CM | POA: Diagnosis not present

## 2015-09-30 DIAGNOSIS — L570 Actinic keratosis: Secondary | ICD-10-CM | POA: Diagnosis not present

## 2015-09-30 DIAGNOSIS — D0439 Carcinoma in situ of skin of other parts of face: Secondary | ICD-10-CM | POA: Diagnosis not present

## 2015-10-14 DIAGNOSIS — H04121 Dry eye syndrome of right lacrimal gland: Secondary | ICD-10-CM | POA: Diagnosis not present

## 2015-10-20 DIAGNOSIS — Z23 Encounter for immunization: Secondary | ICD-10-CM | POA: Diagnosis not present

## 2015-10-26 DIAGNOSIS — Z961 Presence of intraocular lens: Secondary | ICD-10-CM | POA: Diagnosis not present

## 2015-12-19 ENCOUNTER — Other Ambulatory Visit: Payer: Self-pay | Admitting: Cardiology

## 2016-03-07 DIAGNOSIS — N390 Urinary tract infection, site not specified: Secondary | ICD-10-CM | POA: Diagnosis not present

## 2016-03-07 DIAGNOSIS — Z0000 Encounter for general adult medical examination without abnormal findings: Secondary | ICD-10-CM | POA: Diagnosis not present

## 2016-03-07 DIAGNOSIS — I10 Essential (primary) hypertension: Secondary | ICD-10-CM | POA: Diagnosis not present

## 2016-03-07 DIAGNOSIS — Z131 Encounter for screening for diabetes mellitus: Secondary | ICD-10-CM | POA: Diagnosis not present

## 2016-03-21 NOTE — Progress Notes (Signed)
HPI: FU CAD. 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 Follow-up recommended 5 years. Nuclear study March 2017 showed ejection fraction 46%.There was a large anteroapical reversible perfusion defect suggestive of ischemia. Cardiac catheterization March 2017 showed a 45% first marginal, 45-55% mid LAD and normal LV function. FFR of LAD not significant. Since last seen, she is appropriately emotional as she recently lost her husband. She denies dyspnea, chest pain, palpitations or syncope. Some dizziness with changing positions.  Current Outpatient Prescriptions  Medication Sig Dispense Refill  . amLODipine (NORVASC) 10 MG tablet Take 1 tablet (10 mg total) by mouth daily. 90 tablet 3  . bisoprolol (ZEBETA) 5 MG tablet Take 5 mg by mouth at bedtime.    . ergocalciferol (VITAMIN D2) 50000 UNITS capsule Take 1.25 Units by mouth every Saturday.     [name] FLUoxetine (PROZAC) 40 MG capsule Take 40 mg by mouth daily.    [name] losartan (COZAAR) 50 MG tablet Take 50 mg by mouth daily.     No current facility-administered medications for this visit.      Past Medical History:  Diagnosis Date  . COPD (chronic obstructive pulmonary disease) ([place])   . DJD (degenerative joint disease)   . Fibromyalgia   . GERD (gastroesophageal reflux disease)   . Hypertension   . Nephrolithiasis   . Rheumatoid arthritis [facility])     Past Surgical History:  Procedure Laterality Date  . CARDIAC CATHETERIZATION N/A 04/08/2015   Procedure: Left Heart Cath and Coronary Angiography;  Surgeon: [name], MD;  Location: [facility];  Service: Cardiovascular;  Laterality: N/A;  . CARDIAC CATHETERIZATION N/A 04/08/2015   Procedure: Intravascular Pressure Wire/FFR Study;  Surgeon: [name], MD;  Location: [facility];  Service: Cardiovascular;  Laterality: N/A;  . lower back      Social History   Social History  . Marital status: Married    Spouse  name: N/A  . Number of children: 3  . Years of education: N/A   Occupational History  . Not on file.   Social History Main Topics  . Smoking status: Current Every Day Smoker  . Smokeless tobacco: Never Used  . Alcohol use 0.0 oz/week     Comment: Occasional  . Drug use: No  . Sexual activity: Not on file   Other Topics Concern  . Not on file   Social History Narrative  . No narrative on file    Family History  Problem Relation Age of Onset  . Heart failure Mother   . Heart disease Mother   . Diabetes Mother   . Hypertension Mother   . Emphysema Father   . Heart failure Father     CABG, AVR    ROS: no fevers or chills, productive cough, hemoptysis, dysphasia, odynophagia, melena, hematochezia, dysuria, hematuria, rash, seizure activity, orthopnea, PND, pedal edema, claudication. Remaining systems are negative.  Physical Exam: Well-developed well-nourished in no acute distress.  Skin is warm and dry.  HEENT is normal.  Neck is supple. No bruits Chest is clear to auscultation with normal expansion.  Cardiovascular exam is regular rate and rhythm.  Abdominal exam nontender or distended. No masses palpated. Extremities show no edema. neuro grossly intact  ECG- Sinus rhythm at a rate of 64. No ST changes. personally reviewed  A/P  1 Coronary artery disease-resume aspirin and statin.  2 hyperlipidemia-patient did not tolerate low-dose Lipitor. I will try Pravachol  40 mg daily. Check lipids and liver in 4 weeks. If she does not tolerate this we will refer to lipid clinic.  3 hypertension-blood pressure controlled. Continue present medications.  4 tobacco abuse-patient counseled on discontinuing.  5 abdominal aortic aneurysm-follow-up abdominal ultrasound June 2021.  Kirk Ruths, MD

## 2016-03-22 ENCOUNTER — Encounter: Payer: Self-pay | Admitting: Cardiology

## 2016-03-22 DIAGNOSIS — L821 Other seborrheic keratosis: Secondary | ICD-10-CM | POA: Diagnosis not present

## 2016-03-22 DIAGNOSIS — L910 Hypertrophic scar: Secondary | ICD-10-CM | POA: Diagnosis not present

## 2016-03-22 DIAGNOSIS — D1801 Hemangioma of skin and subcutaneous tissue: Secondary | ICD-10-CM | POA: Diagnosis not present

## 2016-03-22 DIAGNOSIS — Z85828 Personal history of other malignant neoplasm of skin: Secondary | ICD-10-CM | POA: Diagnosis not present

## 2016-04-03 ENCOUNTER — Ambulatory Visit (INDEPENDENT_AMBULATORY_CARE_PROVIDER_SITE_OTHER): Payer: Medicare Other | Admitting: Cardiology

## 2016-04-03 ENCOUNTER — Encounter: Payer: Self-pay | Admitting: Cardiology

## 2016-04-03 ENCOUNTER — Other Ambulatory Visit: Payer: Self-pay | Admitting: *Deleted

## 2016-04-03 VITALS — BP 120/70 | HR 64 | Ht 64.0 in | Wt 188.6 lb

## 2016-04-03 DIAGNOSIS — I2510 Atherosclerotic heart disease of native coronary artery without angina pectoris: Secondary | ICD-10-CM | POA: Diagnosis not present

## 2016-04-03 DIAGNOSIS — E7800 Pure hypercholesterolemia, unspecified: Secondary | ICD-10-CM | POA: Diagnosis not present

## 2016-04-03 DIAGNOSIS — Z720 Tobacco use: Secondary | ICD-10-CM

## 2016-04-03 DIAGNOSIS — I10 Essential (primary) hypertension: Secondary | ICD-10-CM | POA: Diagnosis not present

## 2016-04-03 DIAGNOSIS — R072 Precordial pain: Secondary | ICD-10-CM

## 2016-04-03 MED ORDER — AMLODIPINE BESYLATE 10 MG PO TABS: 10.0000 mg | ORAL_TABLET | Freq: Every day | ORAL | 3 refills | Status: DC

## 2016-04-03 MED ORDER — ASPIRIN EC 81 MG PO TBEC: 81.0000 mg | DELAYED_RELEASE_TABLET | Freq: Every day | ORAL | 3 refills | Status: DC

## 2016-04-03 MED ORDER — PRAVASTATIN SODIUM 40 MG PO TABS: 40.0000 mg | ORAL_TABLET | Freq: Every evening | ORAL | 11 refills | Status: DC

## 2016-04-03 NOTE — Patient Instructions (Signed)
Medication Instructions:   START ASPIRIN 81 MG ONCE DAILY  START PRAVASTATIN 40 MG ONCE DAILY  Labwork:  Your physician recommends that you return for lab work in: 4 WEEKS= [facility]=DO NOT EAT PRIOR TO LAB WORK  Follow-Up:  Your physician wants you to follow-up in: [place] will receive a reminder letter in the mail two months in advance. If you don't receive a letter, please call our office to schedule the follow-up appointment.   If you need a refill on your cardiac medications before your next appointment, please call your pharmacy.

## 2016-04-10 DIAGNOSIS — Z0000 Encounter for general adult medical examination without abnormal findings: Secondary | ICD-10-CM | POA: Diagnosis not present

## 2016-04-10 DIAGNOSIS — E7800 Pure hypercholesterolemia, unspecified: Secondary | ICD-10-CM | POA: Diagnosis not present

## 2016-04-10 DIAGNOSIS — I10 Essential (primary) hypertension: Secondary | ICD-10-CM | POA: Diagnosis not present

## 2016-05-07 ENCOUNTER — Telehealth: Payer: Self-pay | Admitting: Cardiology

## 2016-05-07 DIAGNOSIS — E785 Hyperlipidemia, unspecified: Secondary | ICD-10-CM

## 2016-05-07 NOTE — Telephone Encounter (Signed)
Follow up   Vicente Males is calling from [facility].   Pt c/o medication issue:  1. Name of Medication: pravastatin 40 mg  2. How are you currently taking this medication (dosage and times per day)?   3. Are you having a reaction (difficulty breathing--STAT)? Leg cramps  4. What is your medication issue? Pt is not taking this medication because of leg cramps. Vicente Males is asking if it can be changed

## 2016-05-07 NOTE — Telephone Encounter (Signed)
New message     Pt is returning [place] call , the  pravastatin (PRAVACHOL) 40 MG tablet Take 1 tablet (40 mg total) by mouth every evening.   She stopped taking it because it was giving her severe pain in her legs

## 2016-05-07 NOTE — Telephone Encounter (Signed)
DC pravachol; crestor 5 mg daily; lipids and liver 4 weeks Kirk Ruths

## 2016-05-07 NOTE — Telephone Encounter (Signed)
Spoke with [facility] she states that she was calling to inquire when we were going to rx something new for pt instead of Pravastatin. I expleained that we spoke with pt ~1 hour ago and are waiting for Dr Stanford Breed for further direction. Will await further direction.

## 2016-05-07 NOTE — Telephone Encounter (Signed)
Howie Ill at 05/07/2016 1:57 PM   Status: Signed    New message     Pt is returning [place] call , the  pravastatin (PRAVACHOL) 40 MG tablet Take 1 tablet (40 mg total) by mouth every evening.   She stopped taking it because it was giving her severe pain in her legs

## 2016-05-07 NOTE — Telephone Encounter (Signed)
Lm2cb 

## 2016-05-07 NOTE — Telephone Encounter (Signed)
We can try to resume pravastatin at 1/2 tablet daily. Sometime side effects are dose dependent.   Other options include: livalo 1mg  daily   Zetia 10mg  daily (non-statin)

## 2016-05-07 NOTE — Telephone Encounter (Signed)
Pt says she can not take the Pravastatin,wonder if Dr Stanford Breed could recommend something else please.i

## 2016-05-07 NOTE — Telephone Encounter (Signed)
LMTCB

## 2016-05-07 NOTE — Telephone Encounter (Signed)
Left message to call back, per DPR. 

## 2016-05-07 NOTE — Telephone Encounter (Signed)
This is a duplicate encounter regarding same patient issue. This encounter will be closed

## 2016-05-07 NOTE — Telephone Encounter (Signed)
Returned call to patient She states took 1 week of pravastatin  She has been off the medication for 2-3 weeks Her legs don't hurt like they did - she feels fine She took lipitor in the past and had similar leg pain   Will route to Dr. Stanford Breed for advice

## 2016-05-08 MED ORDER — ROSUVASTATIN CALCIUM 5 MG PO TABS: 5.0000 mg | ORAL_TABLET | Freq: Every day | ORAL | 11 refills | Status: DC

## 2016-05-08 NOTE — Telephone Encounter (Signed)
Spoke with pt, Aware of dr crenshaw's recommendations.  ?New script sent to the pharmacy  ?Lab orders mailed to the pt  ?

## 2016-05-08 NOTE — Telephone Encounter (Signed)
Left message for pt to call.

## 2016-05-08 NOTE — Telephone Encounter (Signed)
Follow up ° ° ° ° ° °Returning a call to the nurse °

## 2016-05-18 ENCOUNTER — Telehealth: Payer: Self-pay | Admitting: Cardiology

## 2016-05-18 NOTE — Telephone Encounter (Signed)
Pt reports continued leg discomfort on crestor. This is the 3rd statin she's tried, and she was recently switched from pravastatin. Pt seeking advice on whether it's OK to stop this medication & if so would she be able to take niacin instead per suggestion by PCP. Pt aware I will route to provider for review.

## 2016-05-18 NOTE — Telephone Encounter (Signed)
New Message  Pt c/o medication issue:  1. Name of Medication: rosuvastatin (crestor) 5 mg tablet once daily  2. How are you currently taking this medication (dosage and times per day)? See above  3. Are you having a reaction (difficulty breathing--STAT)? N/A  4. What is your medication issue? Pt voiced this med hurts her legs and pt voiced pcp suggested niacin.

## 2016-05-20 NOTE — Telephone Encounter (Signed)
Dc crestor; lipid  Clinic eval Kirk Ruths

## 2016-05-21 NOTE — Telephone Encounter (Signed)
Unable to reach pt or leave a message  

## 2016-05-22 NOTE — Telephone Encounter (Signed)
Left msg for pt on home phone line.

## 2016-05-22 NOTE — Telephone Encounter (Signed)
Lm2cb 

## 2016-05-23 NOTE — Telephone Encounter (Signed)
Advice communicated - she has stopped crestor and will await further instruction at lipid clinic appt. A msg has been sent for scheduler to arrange this visit.

## 2016-05-30 ENCOUNTER — Other Ambulatory Visit: Payer: Self-pay | Admitting: Cardiology

## 2016-05-30 ENCOUNTER — Ambulatory Visit (INDEPENDENT_AMBULATORY_CARE_PROVIDER_SITE_OTHER): Payer: Medicare Other | Admitting: Pharmacist

## 2016-05-30 DIAGNOSIS — E785 Hyperlipidemia, unspecified: Secondary | ICD-10-CM | POA: Diagnosis not present

## 2016-05-30 DIAGNOSIS — E7800 Pure hypercholesterolemia, unspecified: Secondary | ICD-10-CM

## 2016-05-30 DIAGNOSIS — I2510 Atherosclerotic heart disease of native coronary artery without angina pectoris: Secondary | ICD-10-CM | POA: Diagnosis not present

## 2016-05-30 DIAGNOSIS — I2583 Coronary atherosclerosis due to lipid rich plaque: Secondary | ICD-10-CM

## 2016-05-30 NOTE — Patient Instructions (Addendum)
Lipid clinic **Blood work today** **Start taking rosuvastatin (Crestor) 5mg  every Monday - Start on May/28/18** **Increase rosuvastatin to 5mg  every Monday and Friday if able to tolerate once a week for 3 weeks**  Cholesterol Cholesterol is a fat. Your body needs a small amount of cholesterol. Cholesterol (plaque) may build up in your blood vessels (arteries). That makes you more likely to have a heart attack or stroke. You cannot feel your cholesterol level. Having a blood test is the only way to find out if your level is high. Keep your test results. Work with your doctor to keep your cholesterol at a good level. What do the results mean?  Total cholesterol is how much cholesterol is in your blood.  LDL is bad cholesterol. This is the type that can build up. Try to have low LDL.  HDL is good cholesterol. It cleans your blood vessels and carries LDL away. Try to have high HDL.  Triglycerides are fat that the body can store or burn for energy. What are good levels of cholesterol?  Total cholesterol below 200.  LDL below 100 is good for people who have health risks. LDL below 70 is good for people who have very high risks.  HDL above 40 is good. It is best to have HDL of 60 or higher.  Triglycerides below 150. How can I lower my cholesterol? Diet  Follow your diet program as told by your doctor.  Choose fish, white meat chicken, or [place] that is roasted or baked. Try not to eat red meat, fried foods, sausage, or lunch meats.  Eat lots of fresh fruits and vegetables.  Choose whole grains, beans, pasta, potatoes, and cereals.  Choose olive oil, corn oil, or canola oil. Only use small amounts.  Try not to eat butter, mayonnaise, shortening, or palm kernel oils.  Try not to eat foods with trans fats.  Choose low-fat or nonfat dairy foods.  Drink skim or nonfat milk.  Eat low-fat or nonfat yogurt and cheeses.  Try not to drink whole milk or cream.  Try not to eat ice  cream, egg yolks, or full-fat cheeses.  Healthy desserts include angel food cake, ginger snaps, animal crackers, hard candy, popsicles, and low-fat or nonfat frozen yogurt. Try not to eat pastries, cakes, pies, and cookies. Exercise  Follow your exercise program as told by your doctor.  Be more active. Try gardening, walking, and taking the stairs.  Ask your doctor about ways that you can be more active. Medicine  Take over-the-counter and prescription medicines only as told by your doctor. This information is not intended to replace advice given to you by your health care provider. Make sure you discuss any questions you have with your health care provider. Document Released: 03/23/2008 Document Revised: 07/27/2015 Document Reviewed: 07/07/2015 Elsevier Interactive Patient Education  2017 [employer].

## 2016-05-30 NOTE — Progress Notes (Signed)
Patient ID: Janet Johnston                 DOB: 1950-09-17                    MRN: [id number]     HPI: Janet Johnston is a 66 y.o. female patient referred to lipid clinic by Dr. Stanford Breed. PMH includes hypertension, angina, AAA, RA and CAD with 45-55% LAD and normal LV function. Documented intolerance to statins noted. Previously tried atorvastatin 10mg  daily, pravastatin 40mg  daily, and rosuvastatin 5mg  with all causing severe leg pain and difficulty ambulating.  Patient presents today for potential PCSK9 inhibitor initiation. Expressed interest on controlling her cholesterol with diet and lifestyle modifications only. Never tried low doe rosuvastatin, pitavastatin or ezetimibe.  Current Medications: none  Intolerances:  Atorvastatin 10 mg daily Pravastatin 40mg  daily Rosuvastatin 5mg  daily  LDL goal: <70  Diet: eats a lot out and take-out, usually only 2 meals per day only  Exercise: walks her dogs few times per day, thinking about starting at the [facility]  Social History: Current smoker 1/2 pack per day; eats out frequently  Labs:  CHO 224; HDL 54; TG 167; LDL 137 (no therapy) 05/30/16  CHO 186; HDL 57; TG 149; LDL 99 (on pravastatin 40mg ) 03/07/2016  CHO 152; HDL 56; TG 103; LDL 75 (on atorvastatin 10mg ) 09/02/2015              Past Medical History:  Diagnosis Date  . COPD (chronic obstructive pulmonary disease) ([place])   . DJD (degenerative joint disease)   . Fibromyalgia   . GERD (gastroesophageal reflux disease)   . Hypertension   . Nephrolithiasis   . Rheumatoid arthritis [facility])     Current Outpatient Prescriptions on File Prior to Visit  Medication Sig Dispense Refill  . amLODipine (NORVASC) 10 MG tablet Take 1 tablet (10 mg total) by mouth daily. 90 tablet 3  . aspirin EC 81 MG tablet Take 1 tablet (81 mg total) by mouth daily. 90 tablet 3  . bisoprolol (ZEBETA) 5 MG tablet Take 5 mg by mouth at bedtime.    . ergocalciferol (VITAMIN D2) 50000 UNITS capsule Take 1.25  Units by mouth every Saturday.     [name] FLUoxetine (PROZAC) 40 MG capsule Take 40 mg by mouth daily.    [name] losartan (COZAAR) 50 MG tablet Take 50 mg by mouth daily.    . rosuvastatin (CRESTOR) 5 MG tablet Take 1 tablet (5 mg total) by mouth daily. 30 tablet 11   No current facility-administered medications on file prior to visit.     Allergies  Allergen Reactions  . Lyrica [Pregabalin] Itching and Rash  . Penicillins Itching and Rash  . Sulfa Antibiotics Itching and Rash    Pure hypercholesterolemia: LDL remains elevated for a patient with history of ASCVD (45-55% LAD).  Patient is unable to tolerate statins due to severe leg pain and difficulty ambulating while on statins. Previously tried and failed atorvastatin 10mg  daily, pravastatin 40mg  daily, and rosuvastatin 5mg  daily.  Will repeat LDL today to obtained a baseline while off all therapy. Patient will initiate rosuvastatin 5mg  weekly for 3 weeks, then increase to 2x/week if able to tolerate weekly dose. Plan to add zetia (ezretimibe) to rosuvastatin therapy if needed.  If patient unable to tolerate low dose rosuvastatin, will apply to insurance for [place] indication, administration, dosing, storage and common side effects of the medication also discussed during office visit. Lifestyle  modifications including: increase physical activity, decrease fat/cholesterol in diet, and eating more fresh fruits and vegetables also discussed.   Cherae Marton Rodriguez-Guzman PharmD, [place] [address] 05/30/2016 8:43 AM

## 2016-05-31 ENCOUNTER — Telehealth: Payer: Self-pay | Admitting: Pharmacist

## 2016-05-31 LAB — HEPATIC FUNCTION PANEL
ALT: 14 IU/L (ref 0–32)
AST: 18 IU/L (ref 0–40)
Albumin: 4.2 g/dL (ref 3.6–4.8)
Alkaline Phosphatase: 98 IU/L (ref 39–117)
Bilirubin Total: 0.4 mg/dL (ref 0.0–1.2)
Bilirubin, Direct: 0.12 mg/dL (ref 0.00–0.40)
Total Protein: 7 g/dL (ref 6.0–8.5)

## 2016-05-31 LAB — LIPID PANEL
Chol/HDL Ratio: 4.1 ratio (ref 0.0–4.4)
Cholesterol, Total: 224 mg/dL — ABNORMAL HIGH (ref 100–199)
HDL: 54 mg/dL (ref >39)
LDL Calculated: 137 mg/dL — ABNORMAL HIGH (ref 0–99)
Triglycerides: 167 mg/dL — ABNORMAL HIGH (ref 0–149)
VLDL Cholesterol Cal: 33 mg/dL (ref 5–40)

## 2016-05-31 MED ORDER — EVOLOCUMAB 140 MG/ML ~~LOC~~ SOAJ: 140.0000 mg | SUBCUTANEOUS | 11 refills | Status: DC

## 2016-05-31 NOTE — Telephone Encounter (Signed)
Discuses results and recommendations with patient.

## 2016-05-31 NOTE — Telephone Encounter (Signed)
LMOM for patient to call back. Already have a plan to cholesterol management but will like to discuss blood work results with patient.

## 2016-05-31 NOTE — Telephone Encounter (Signed)
Follow up    Pt is returning call about lab work.

## 2016-05-31 NOTE — Telephone Encounter (Signed)
-----   Message from Lelon Perla, MD sent at 05/31/2016  7:35 AM EDT ----- Labs to lipid clinic Kirk Ruths

## 2016-06-02 LAB — HEPATIC FUNCTION PANEL

## 2016-06-02 LAB — LIPID PANEL

## 2016-07-18 DIAGNOSIS — M9903 Segmental and somatic dysfunction of lumbar region: Secondary | ICD-10-CM | POA: Diagnosis not present

## 2016-07-18 DIAGNOSIS — M545 Low back pain: Secondary | ICD-10-CM | POA: Diagnosis not present

## 2016-07-18 DIAGNOSIS — M9902 Segmental and somatic dysfunction of thoracic region: Secondary | ICD-10-CM | POA: Diagnosis not present

## 2016-07-18 DIAGNOSIS — M546 Pain in thoracic spine: Secondary | ICD-10-CM | POA: Diagnosis not present

## 2016-07-19 DIAGNOSIS — M545 Low back pain: Secondary | ICD-10-CM | POA: Diagnosis not present

## 2016-07-19 DIAGNOSIS — N390 Urinary tract infection, site not specified: Secondary | ICD-10-CM | POA: Diagnosis not present

## 2016-07-23 DIAGNOSIS — M545 Low back pain: Secondary | ICD-10-CM | POA: Diagnosis not present

## 2016-07-23 DIAGNOSIS — M546 Pain in thoracic spine: Secondary | ICD-10-CM | POA: Diagnosis not present

## 2016-07-23 DIAGNOSIS — M9902 Segmental and somatic dysfunction of thoracic region: Secondary | ICD-10-CM | POA: Diagnosis not present

## 2016-07-23 DIAGNOSIS — M9903 Segmental and somatic dysfunction of lumbar region: Secondary | ICD-10-CM | POA: Diagnosis not present

## 2016-07-31 DIAGNOSIS — D1801 Hemangioma of skin and subcutaneous tissue: Secondary | ICD-10-CM | POA: Diagnosis not present

## 2016-07-31 DIAGNOSIS — L720 Epidermal cyst: Secondary | ICD-10-CM | POA: Diagnosis not present

## 2016-07-31 DIAGNOSIS — D692 Other nonthrombocytopenic purpura: Secondary | ICD-10-CM | POA: Diagnosis not present

## 2016-07-31 DIAGNOSIS — L821 Other seborrheic keratosis: Secondary | ICD-10-CM | POA: Diagnosis not present

## 2016-07-31 DIAGNOSIS — L918 Other hypertrophic disorders of the skin: Secondary | ICD-10-CM | POA: Diagnosis not present

## 2016-08-08 ENCOUNTER — Telehealth: Payer: Self-pay | Admitting: Pharmacist

## 2016-08-08 NOTE — Telephone Encounter (Signed)
Patient Janet Johnston safety net was approved 07/10/2016 but patient had not returned they call to arrange delivery yet.  Talked to Mr Rauth today. She is to call and arrange deliver of Repatha ASAP.

## 2016-08-09 DIAGNOSIS — M5136 Other intervertebral disc degeneration, lumbar region: Secondary | ICD-10-CM | POA: Diagnosis not present

## 2016-08-09 DIAGNOSIS — R5383 Other fatigue: Secondary | ICD-10-CM | POA: Diagnosis not present

## 2016-08-09 DIAGNOSIS — M0579 Rheumatoid arthritis with rheumatoid factor of multiple sites without organ or systems involvement: Secondary | ICD-10-CM | POA: Diagnosis not present

## 2016-08-09 DIAGNOSIS — M797 Fibromyalgia: Secondary | ICD-10-CM | POA: Diagnosis not present

## 2016-08-09 DIAGNOSIS — M7989 Other specified soft tissue disorders: Secondary | ICD-10-CM | POA: Diagnosis not present

## 2016-08-09 DIAGNOSIS — M2550 Pain in unspecified joint: Secondary | ICD-10-CM | POA: Diagnosis not present

## 2016-08-31 DIAGNOSIS — M0579 Rheumatoid arthritis with rheumatoid factor of multiple sites without organ or systems involvement: Secondary | ICD-10-CM | POA: Diagnosis not present

## 2016-08-31 DIAGNOSIS — M797 Fibromyalgia: Secondary | ICD-10-CM | POA: Diagnosis not present

## 2016-08-31 DIAGNOSIS — M5136 Other intervertebral disc degeneration, lumbar region: Secondary | ICD-10-CM | POA: Diagnosis not present

## 2016-08-31 DIAGNOSIS — M2550 Pain in unspecified joint: Secondary | ICD-10-CM | POA: Diagnosis not present

## 2016-09-13 DIAGNOSIS — H5202 Hypermetropia, left eye: Secondary | ICD-10-CM | POA: Diagnosis not present

## 2016-09-13 DIAGNOSIS — H2513 Age-related nuclear cataract, bilateral: Secondary | ICD-10-CM | POA: Diagnosis not present

## 2016-10-11 DIAGNOSIS — E7800 Pure hypercholesterolemia, unspecified: Secondary | ICD-10-CM | POA: Diagnosis not present

## 2016-10-11 DIAGNOSIS — E559 Vitamin D deficiency, unspecified: Secondary | ICD-10-CM | POA: Diagnosis not present

## 2016-10-11 DIAGNOSIS — I10 Essential (primary) hypertension: Secondary | ICD-10-CM | POA: Diagnosis not present

## 2016-10-11 DIAGNOSIS — J069 Acute upper respiratory infection, unspecified: Secondary | ICD-10-CM | POA: Diagnosis not present

## 2016-10-12 ENCOUNTER — Other Ambulatory Visit: Payer: Self-pay | Admitting: Internal Medicine

## 2016-10-12 DIAGNOSIS — Z1231 Encounter for screening mammogram for malignant neoplasm of breast: Secondary | ICD-10-CM

## 2016-10-25 ENCOUNTER — Ambulatory Visit
Admission: RE | Admit: 2016-10-25 | Discharge: 2016-10-25 | Disposition: A | Discharge status: Home / Self Care | Payer: Medicare Other | Source: Ambulatory Visit | Attending: Internal Medicine | Admitting: Internal Medicine

## 2016-10-25 DIAGNOSIS — Z1231 Encounter for screening mammogram for malignant neoplasm of breast: Secondary | ICD-10-CM | POA: Diagnosis not present

## 2016-10-30 DIAGNOSIS — M0579 Rheumatoid arthritis with rheumatoid factor of multiple sites without organ or systems involvement: Secondary | ICD-10-CM | POA: Diagnosis not present

## 2016-10-30 DIAGNOSIS — M797 Fibromyalgia: Secondary | ICD-10-CM | POA: Diagnosis not present

## 2016-10-30 DIAGNOSIS — M150 Primary generalized (osteo)arthritis: Secondary | ICD-10-CM | POA: Diagnosis not present

## 2016-10-30 DIAGNOSIS — M5136 Other intervertebral disc degeneration, lumbar region: Secondary | ICD-10-CM | POA: Diagnosis not present

## 2016-10-30 DIAGNOSIS — M2550 Pain in unspecified joint: Secondary | ICD-10-CM | POA: Diagnosis not present

## 2016-12-13 DIAGNOSIS — Z23 Encounter for immunization: Secondary | ICD-10-CM | POA: Diagnosis not present

## 2016-12-14 ENCOUNTER — Telehealth: Payer: Self-pay | Admitting: Pharmacist

## 2016-12-14 DIAGNOSIS — E7800 Pure hypercholesterolemia, unspecified: Secondary | ICD-10-CM

## 2016-12-14 NOTE — Addendum Note (Signed)
Addended by: Harrington Challenger on: 12/14/2016 05:41 PM   Modules accepted: Orders

## 2016-12-14 NOTE — Telephone Encounter (Signed)
LMOM; patient to call back  Still using Repatha? Need f/u lipid panel and to complete safety net paperwork for next year.

## 2017-01-31 DIAGNOSIS — M5136 Other intervertebral disc degeneration, lumbar region: Secondary | ICD-10-CM | POA: Diagnosis not present

## 2017-01-31 DIAGNOSIS — M0579 Rheumatoid arthritis with rheumatoid factor of multiple sites without organ or systems involvement: Secondary | ICD-10-CM | POA: Diagnosis not present

## 2017-01-31 DIAGNOSIS — M2550 Pain in unspecified joint: Secondary | ICD-10-CM | POA: Diagnosis not present

## 2017-01-31 DIAGNOSIS — M150 Primary generalized (osteo)arthritis: Secondary | ICD-10-CM | POA: Diagnosis not present

## 2017-01-31 DIAGNOSIS — M797 Fibromyalgia: Secondary | ICD-10-CM | POA: Diagnosis not present

## 2017-02-11 ENCOUNTER — Other Ambulatory Visit: Payer: Self-pay | Admitting: Ophthalmology

## 2017-02-11 DIAGNOSIS — D23121 Other benign neoplasm of skin of left upper eyelid, including canthus: Secondary | ICD-10-CM | POA: Diagnosis not present

## 2017-02-11 DIAGNOSIS — L720 Epidermal cyst: Secondary | ICD-10-CM | POA: Diagnosis not present

## 2017-02-26 DIAGNOSIS — M549 Dorsalgia, unspecified: Secondary | ICD-10-CM | POA: Diagnosis not present

## 2017-02-26 DIAGNOSIS — M4316 Spondylolisthesis, lumbar region: Secondary | ICD-10-CM | POA: Diagnosis not present

## 2017-02-26 DIAGNOSIS — M546 Pain in thoracic spine: Secondary | ICD-10-CM | POA: Diagnosis not present

## 2017-02-26 DIAGNOSIS — M5440 Lumbago with sciatica, unspecified side: Secondary | ICD-10-CM | POA: Diagnosis not present

## 2017-02-26 DIAGNOSIS — M4317 Spondylolisthesis, lumbosacral region: Secondary | ICD-10-CM | POA: Diagnosis not present

## 2017-03-05 DIAGNOSIS — M5126 Other intervertebral disc displacement, lumbar region: Secondary | ICD-10-CM | POA: Diagnosis not present

## 2017-03-05 DIAGNOSIS — M5416 Radiculopathy, lumbar region: Secondary | ICD-10-CM | POA: Diagnosis not present

## 2017-03-13 DIAGNOSIS — M47816 Spondylosis without myelopathy or radiculopathy, lumbar region: Secondary | ICD-10-CM | POA: Diagnosis not present

## 2017-03-13 DIAGNOSIS — M5440 Lumbago with sciatica, unspecified side: Secondary | ICD-10-CM | POA: Diagnosis not present

## 2017-03-13 DIAGNOSIS — M4316 Spondylolisthesis, lumbar region: Secondary | ICD-10-CM | POA: Diagnosis not present

## 2017-03-13 DIAGNOSIS — M7061 Trochanteric bursitis, right hip: Secondary | ICD-10-CM | POA: Diagnosis not present

## 2017-03-13 DIAGNOSIS — M4317 Spondylolisthesis, lumbosacral region: Secondary | ICD-10-CM | POA: Diagnosis not present

## 2017-03-18 DIAGNOSIS — M7061 Trochanteric bursitis, right hip: Secondary | ICD-10-CM | POA: Diagnosis not present

## 2017-03-27 NOTE — Progress Notes (Signed)
HPI: FU CAD. 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 Follow-up recommended 5 years. Nuclear study March 2017 showed ejection fraction 46%.There was a large anteroapical reversible perfusion defect suggestive of ischemia. Cardiac catheterization March 2017 showed a 45% first marginal, 45-55% mid LAD and normal LV function. FFR of LAD not significant. Since last seen,  she has mild dyspnea on exertion but no orthopnea, PND, pedal edema, exertional chest pain or syncope.  Current Outpatient Medications  Medication Sig Dispense Refill  . amLODipine (NORVASC) 10 MG tablet Take 1 tablet (10 mg total) by mouth daily. 90 tablet 3  . aspirin EC 81 MG tablet Take 1 tablet (81 mg total) by mouth daily. 90 tablet 3  . bisoprolol (ZEBETA) 5 MG tablet Take 5 mg by mouth at bedtime.    . Coenzyme Q10 (CO Q 10) 100 MG CAPS Take by mouth.    . ergocalciferol (VITAMIN D2) 50000 UNITS capsule Take 1.25 Units by mouth every Saturday.     [name] FLUoxetine (PROZAC) 40 MG capsule Take 40 mg by mouth daily.    [name] losartan (COZAAR) 50 MG tablet Take 50 mg by mouth daily.    . Magnesium 250 MG TABS Take 250 mg by mouth daily.    . Multiple Vitamins-Minerals (CENTRUM ADULTS PO) Take 1 tablet by mouth daily.    . rosuvastatin (CRESTOR) 5 MG tablet Take 1 tablet (5 mg total) by mouth daily. (Patient taking differently: Take 5 mg by mouth daily. Takes 1 tab every Monday and Friday) 30 tablet 11   No current facility-administered medications for this visit.      Past Medical History:  Diagnosis Date  . COPD (chronic obstructive pulmonary disease) ([place])   . DJD (degenerative joint disease)   . Fibromyalgia   . GERD (gastroesophageal reflux disease)   . Hypertension   . Nephrolithiasis   . Rheumatoid arthritis [facility])     Past Surgical History:  Procedure Laterality Date  . CARDIAC CATHETERIZATION N/A 04/08/2015   Procedure: Left Heart Cath and Coronary Angiography;   Surgeon: [name], MD;  Location: [place] [facility];  Service: Cardiovascular;  Laterality: N/A;  . CARDIAC CATHETERIZATION N/A 04/08/2015   Procedure: Intravascular Pressure Wire/FFR Study;  Surgeon: [name], MD;  Location: [place] CV LAB;  Service: Cardiovascular;  Laterality: N/A;  . lower back      Social History   Socioeconomic History  . Marital status: Married    Spouse name: Not on file  . Number of children: 3  . Years of education: Not on file  . Highest education level: Not on file  Occupational History  . Not on file  Social Needs  . Financial resource strain: Not on file  . Food insecurity:    Worry: Not on file    Inability: Not on file  . Transportation needs:    Medical: Not on file    Non-medical: Not on file  Tobacco Use  . Smoking status: Current Every Day Smoker  . Smokeless tobacco: Never Used  Substance and Sexual Activity  . Alcohol use: Yes    Alcohol/week: 0.0 oz    Comment: Occasional  . Drug use: No  . Sexual activity: Not on file  Lifestyle  . Physical activity:    Days per week: Not on file    Minutes per session: Not on file  . Stress: Not on file  Relationships  . Social  connections:    Talks on phone: Not on file    Gets together: Not on file    Attends religious service: Not on file    Active member of club or organization: Not on file    Attends meetings of clubs or organizations: Not on file    Relationship status: Not on file  . Intimate partner violence:    Fear of current or ex partner: Not on file    Emotionally abused: Not on file    Physically abused: Not on file    Forced sexual activity: Not on file  Other Topics Concern  . Not on file  Social History Narrative  . Not on file    Family History  Problem Relation Age of Onset  . Heart failure Mother   . Heart disease Mother   . Diabetes Mother   . Hypertension Mother   . Emphysema Father   . Heart failure Father        CABG, AVR    ROS: no  fevers or chills, productive cough, hemoptysis, dysphasia, odynophagia, melena, hematochezia, dysuria, hematuria, rash, seizure activity, orthopnea, PND, pedal edema, claudication. Remaining systems are negative.  Physical Exam: Well-developed well-nourished in no acute distress.  Skin is warm and dry.  HEENT is normal.  Neck is supple.  Chest is clear to auscultation with normal expansion.  Cardiovascular exam is regular rate and rhythm.  Abdominal exam nontender or distended. No masses palpated. Extremities show no edema. neuro grossly intact  ECG-sinus rhythm at a rate of 69.  No ST changes.  Personally reviewed  A/P  1 coronary artery disease-patient denies chest pain.  Plan to continue medical therapy.  Continue aspirin and statin.  2 hyperlipidemia-she is only taking Crestor 5 mg twice weekly because of myalgias.  However she added coenzyme Q which made her feel better.  I will increase to 5 mg daily.  Check lipids and liver in 4 weeks.  May need referral to lipid clinic for [place].  3 hypertension-blood pressure is controlled.  Continue present medications.  4 tobacco abuse-patient counseled on discontinuing.  5 abdominal aortic aneurysm-plan follow-up ultrasound.  Kirk Ruths, MD

## 2017-04-05 ENCOUNTER — Ambulatory Visit: Payer: Medicare Other | Admitting: Cardiology

## 2017-04-05 ENCOUNTER — Encounter: Payer: Self-pay | Admitting: Cardiology

## 2017-04-05 VITALS — BP 140/82 | HR 69 | Ht 64.0 in | Wt 194.8 lb

## 2017-04-05 DIAGNOSIS — E7800 Pure hypercholesterolemia, unspecified: Secondary | ICD-10-CM

## 2017-04-05 DIAGNOSIS — I714 Abdominal aortic aneurysm, without rupture, unspecified: Secondary | ICD-10-CM

## 2017-04-05 DIAGNOSIS — R072 Precordial pain: Secondary | ICD-10-CM | POA: Diagnosis not present

## 2017-04-05 DIAGNOSIS — I2510 Atherosclerotic heart disease of native coronary artery without angina pectoris: Secondary | ICD-10-CM | POA: Diagnosis not present

## 2017-04-05 MED ORDER — ROSUVASTATIN CALCIUM 5 MG PO TABS: 5.0000 mg | ORAL_TABLET | Freq: Every day | ORAL | 3 refills | Status: DC

## 2017-04-05 MED ORDER — AMLODIPINE BESYLATE 10 MG PO TABS: 10.0000 mg | ORAL_TABLET | Freq: Every day | ORAL | 3 refills | Status: DC

## 2017-04-05 NOTE — Patient Instructions (Signed)
Medication Instructions:   INCREASE ROSUVASTATIN TO 5 MG DAILY  Labwork:  Your physician recommends that you return for lab work in: [place]  Testing/Procedures:  Your physician has requested that you have an abdominal aorta duplex. During this test, an ultrasound is used to evaluate the aorta. Allow 30 minutes for this exam. Do not eat after midnight the day before and avoid carbonated beverages   Follow-Up:  Your physician wants you to follow-up in: [place] will receive a reminder letter in the mail two months in advance. If you don't receive a letter, please call our office to schedule the follow-up appointment.   If you need a refill on your cardiac medications before your next appointment, please call your pharmacy.

## 2017-04-09 ENCOUNTER — Ambulatory Visit (HOSPITAL_COMMUNITY)
Admission: RE | Admit: 2017-04-09 | Discharge: 2017-04-09 | Disposition: A | Discharge status: Home / Self Care | Payer: Medicare Other | Source: Ambulatory Visit | Attending: Cardiology | Admitting: Cardiology

## 2017-04-09 ENCOUNTER — Telehealth: Payer: Self-pay | Admitting: Cardiology

## 2017-04-09 DIAGNOSIS — I714 Abdominal aortic aneurysm, without rupture, unspecified: Secondary | ICD-10-CM

## 2017-04-09 NOTE — Telephone Encounter (Signed)
New Message   Pt c/o medication issue:  1. Name of Medication: rosuvastatin (CRESTOR) 5 MG tablet  2. How are you currently taking this medication (dosage and times per day)? Take 1 tablet (5 mg total) by mouth daily  3. Are you having a reaction (difficulty breathing--STAT)? no  4. What is your medication issue? Amy at [facility] drug is wanting clarification on how the pt should take this medication for insurance and quantity purpose

## 2017-04-09 NOTE — Telephone Encounter (Signed)
Spoke with amy, aware crestor increased to 5 mg once daily at last office visit.

## 2017-04-10 DIAGNOSIS — I10 Essential (primary) hypertension: Secondary | ICD-10-CM | POA: Diagnosis not present

## 2017-04-10 DIAGNOSIS — R7303 Prediabetes: Secondary | ICD-10-CM | POA: Diagnosis not present

## 2017-04-10 DIAGNOSIS — E7800 Pure hypercholesterolemia, unspecified: Secondary | ICD-10-CM | POA: Diagnosis not present

## 2017-04-10 DIAGNOSIS — E559 Vitamin D deficiency, unspecified: Secondary | ICD-10-CM | POA: Diagnosis not present

## 2017-04-17 DIAGNOSIS — Z0000 Encounter for general adult medical examination without abnormal findings: Secondary | ICD-10-CM | POA: Diagnosis not present

## 2017-04-17 DIAGNOSIS — Z1212 Encounter for screening for malignant neoplasm of rectum: Secondary | ICD-10-CM | POA: Diagnosis not present

## 2017-06-05 ENCOUNTER — Other Ambulatory Visit: Payer: Self-pay

## 2017-06-05 NOTE — Patient Outreach (Signed)
[place] [facility]) Care Management  06/05/2017  Janet Johnston 06-Nov-1950 [id number]   Medication Adherence call to Janet Johnston patient did not answer, [employer] said patient had said she is only taking 1/2 tablet not a full tablet patient will fail the metric.Janet Johnston  is past due on Rosuvastatin 5 mg.under [place].   [place] Management Direct Dial [phone]  Fax [phone] [email or web address]

## 2017-06-12 DIAGNOSIS — M0579 Rheumatoid arthritis with rheumatoid factor of multiple sites without organ or systems involvement: Secondary | ICD-10-CM | POA: Diagnosis not present

## 2017-06-12 DIAGNOSIS — M2550 Pain in unspecified joint: Secondary | ICD-10-CM | POA: Diagnosis not present

## 2017-06-12 DIAGNOSIS — M5136 Other intervertebral disc degeneration, lumbar region: Secondary | ICD-10-CM | POA: Diagnosis not present

## 2017-07-01 ENCOUNTER — Other Ambulatory Visit: Payer: Self-pay

## 2017-07-01 NOTE — Patient Outreach (Signed)
[place] [facility]) Care Management  07/01/2017  [name] 20-Nov-1950 [id number]   Medication Adherence call to Mrs. [name] left a message for patient to call back patient is due on Rosuvastatin 5 mg Mrs. Hentz is showing past due under [place].  [place] Management Direct Dial [phone]  Fax [phone] [email or web address]

## 2017-08-07 DIAGNOSIS — M79675 Pain in left toe(s): Secondary | ICD-10-CM | POA: Diagnosis not present

## 2017-08-12 DIAGNOSIS — H2512 Age-related nuclear cataract, left eye: Secondary | ICD-10-CM | POA: Diagnosis not present

## 2017-08-12 DIAGNOSIS — H524 Presbyopia: Secondary | ICD-10-CM | POA: Diagnosis not present

## 2017-08-12 DIAGNOSIS — Z79899 Other long term (current) drug therapy: Secondary | ICD-10-CM | POA: Diagnosis not present

## 2017-08-14 DIAGNOSIS — D692 Other nonthrombocytopenic purpura: Secondary | ICD-10-CM | POA: Diagnosis not present

## 2017-08-14 DIAGNOSIS — D224 Melanocytic nevi of scalp and neck: Secondary | ICD-10-CM | POA: Diagnosis not present

## 2017-08-14 DIAGNOSIS — I788 Other diseases of capillaries: Secondary | ICD-10-CM | POA: Diagnosis not present

## 2017-08-14 DIAGNOSIS — L570 Actinic keratosis: Secondary | ICD-10-CM | POA: Diagnosis not present

## 2017-08-14 DIAGNOSIS — D2262 Melanocytic nevi of left upper limb, including shoulder: Secondary | ICD-10-CM | POA: Diagnosis not present

## 2017-09-12 DIAGNOSIS — M2550 Pain in unspecified joint: Secondary | ICD-10-CM | POA: Diagnosis not present

## 2017-09-12 DIAGNOSIS — M5136 Other intervertebral disc degeneration, lumbar region: Secondary | ICD-10-CM | POA: Diagnosis not present

## 2017-09-12 DIAGNOSIS — M150 Primary generalized (osteo)arthritis: Secondary | ICD-10-CM | POA: Diagnosis not present

## 2017-09-12 DIAGNOSIS — M0579 Rheumatoid arthritis with rheumatoid factor of multiple sites without organ or systems involvement: Secondary | ICD-10-CM | POA: Diagnosis not present

## 2017-09-12 DIAGNOSIS — M797 Fibromyalgia: Secondary | ICD-10-CM | POA: Diagnosis not present

## 2017-10-02 ENCOUNTER — Other Ambulatory Visit: Payer: Self-pay | Admitting: Registered Nurse

## 2017-10-02 DIAGNOSIS — Z1231 Encounter for screening mammogram for malignant neoplasm of breast: Secondary | ICD-10-CM

## 2017-10-17 DIAGNOSIS — I2510 Atherosclerotic heart disease of native coronary artery without angina pectoris: Secondary | ICD-10-CM | POA: Diagnosis not present

## 2017-10-17 DIAGNOSIS — R7303 Prediabetes: Secondary | ICD-10-CM | POA: Diagnosis not present

## 2017-10-17 DIAGNOSIS — Z23 Encounter for immunization: Secondary | ICD-10-CM | POA: Diagnosis not present

## 2017-10-29 ENCOUNTER — Ambulatory Visit
Admission: RE | Admit: 2017-10-29 | Discharge: 2017-10-29 | Disposition: A | Discharge status: Home / Self Care | Payer: Medicare Other | Source: Ambulatory Visit | Attending: Registered Nurse | Admitting: Registered Nurse

## 2017-10-29 DIAGNOSIS — Z1231 Encounter for screening mammogram for malignant neoplasm of breast: Secondary | ICD-10-CM

## 2017-12-11 DIAGNOSIS — M961 Postlaminectomy syndrome, not elsewhere classified: Secondary | ICD-10-CM | POA: Diagnosis not present

## 2017-12-11 DIAGNOSIS — M4316 Spondylolisthesis, lumbar region: Secondary | ICD-10-CM | POA: Diagnosis not present

## 2017-12-11 DIAGNOSIS — G894 Chronic pain syndrome: Secondary | ICD-10-CM | POA: Diagnosis not present

## 2017-12-12 DIAGNOSIS — M2550 Pain in unspecified joint: Secondary | ICD-10-CM | POA: Diagnosis not present

## 2017-12-12 DIAGNOSIS — M0579 Rheumatoid arthritis with rheumatoid factor of multiple sites without organ or systems involvement: Secondary | ICD-10-CM | POA: Diagnosis not present

## 2017-12-12 DIAGNOSIS — M5136 Other intervertebral disc degeneration, lumbar region: Secondary | ICD-10-CM | POA: Diagnosis not present

## 2017-12-13 DIAGNOSIS — R0789 Other chest pain: Secondary | ICD-10-CM | POA: Diagnosis not present

## 2017-12-13 DIAGNOSIS — Z720 Tobacco use: Secondary | ICD-10-CM | POA: Diagnosis not present

## 2017-12-18 ENCOUNTER — Encounter (INDEPENDENT_AMBULATORY_CARE_PROVIDER_SITE_OTHER): Payer: Self-pay

## 2017-12-18 ENCOUNTER — Ambulatory Visit: Payer: Medicare Other | Admitting: Adult Health

## 2017-12-18 ENCOUNTER — Encounter: Payer: Self-pay | Admitting: Adult Health

## 2017-12-18 ENCOUNTER — Other Ambulatory Visit: Payer: Self-pay | Admitting: Adult Health

## 2017-12-18 VITALS — BP 124/70 | HR 73 | Ht 64.0 in | Wt 197.0 lb

## 2017-12-18 DIAGNOSIS — I2510 Atherosclerotic heart disease of native coronary artery without angina pectoris: Secondary | ICD-10-CM | POA: Diagnosis not present

## 2017-12-18 DIAGNOSIS — I209 Angina pectoris, unspecified: Secondary | ICD-10-CM

## 2017-12-18 DIAGNOSIS — Z720 Tobacco use: Secondary | ICD-10-CM

## 2017-12-18 DIAGNOSIS — E7800 Pure hypercholesterolemia, unspecified: Secondary | ICD-10-CM | POA: Diagnosis not present

## 2017-12-18 DIAGNOSIS — I2583 Coronary atherosclerosis due to lipid rich plaque: Secondary | ICD-10-CM

## 2017-12-18 MED ORDER — NITROGLYCERIN 0.4 MG SL SUBL: 0.4000 mg | SUBLINGUAL_TABLET | SUBLINGUAL | 2 refills | Status: DC | PRN

## 2017-12-18 NOTE — Progress Notes (Signed)
Cardiology Office Note   Date:  12/18/2017   ID:  Janet Johnston, Janet Johnston 24-May-1950, MRN [id number]  PCP:  Jani Gravel, MD  Cardiologist: Dr. Stanford Breed Chief Complaint  Patient presents with  . Follow-up  . Chest Pain    Pressure.     History of Present Illness: Janet Johnston is a 67 y.o. female who presents for ongoing assessment and management of CAD, eclasia of the abdominal aorta, cardiac cath in 2017 showed a 45% first marginal, 45%-55% mid LAD and normal LV function, FFR of the LAD not significant; HL, HTN, ongoing tobacco abuse   Was last seen by Dr Stanford Breed 04/05/2017. Crestor was increased to 5 mg daily from twice a week. She was counseled on tobacco cessation.   She comes today with complaints of worsening chest pain and pressure which occurs with exertion. She states just walking to the mailbox She also sometimes experiences chest discomfort which awakens her at night.   She unfortunately continues to smoke an is under a great deal of personal stress. She is easily tearful when discussing her problems.   Past Medical History:  Diagnosis Date  . COPD (chronic obstructive pulmonary disease) ([place])   . DJD (degenerative joint disease)   . Fibromyalgia   . GERD (gastroesophageal reflux disease)   . Hypertension   . Nephrolithiasis   . Rheumatoid arthritis [facility])     Past Surgical History:  Procedure Laterality Date  . CARDIAC CATHETERIZATION N/A 04/08/2015   Procedure: Left Heart Cath and Coronary Angiography;  Surgeon: [name], MD;  Location: [place] [facility];  Service: Cardiovascular;  Laterality: N/A;  . CARDIAC CATHETERIZATION N/A 04/08/2015   Procedure: Intravascular Pressure Wire/FFR Study;  Surgeon: [name], MD;  Location: [place] [facility];  Service: Cardiovascular;  Laterality: N/A;  . lower back       Current Outpatient Medications  Medication Sig Dispense Refill  . amLODipine (NORVASC) 10 MG tablet Take 1 tablet (10 mg total) by mouth  daily. 90 tablet 3  . aspirin EC 81 MG tablet Take 1 tablet (81 mg total) by mouth daily. 90 tablet 3  . bisoprolol (ZEBETA) 5 MG tablet Take 5 mg by mouth at bedtime.    . Coenzyme Q10 (CO Q 10) 100 MG CAPS Take by mouth.    . ergocalciferol (VITAMIN D2) 50000 UNITS capsule Take 1.25 Units by mouth every Saturday.     [name] FLUoxetine (PROZAC) 40 MG capsule Take 40 mg by mouth daily.    . Hydroxychloroquine Sulfate (PLAQUENIL PO) Take 1 tablet by mouth daily.    [name] losartan (COZAAR) 50 MG tablet Take 50 mg by mouth daily.    . Magnesium 500 MG TABS Take 500 mg by mouth daily.    . Multiple Vitamins-Minerals (CENTRUM ADULTS PO) Take 1 tablet by mouth daily.     No current facility-administered medications for this visit.     Allergies:   Lyrica [pregabalin]; Penicillins; and Sulfa antibiotics    Social History:  The patient  reports that she has been smoking. She has never used smokeless tobacco. She reports that she drinks alcohol. She reports that she does not use drugs.   Family History:  The patient's family history includes Diabetes in her mother; Emphysema in her father; Heart disease in her mother; Heart failure in her father and mother; Hypertension in her mother.    ROS: All other systems are reviewed and negative. Unless otherwise mentioned in H&P  PHYSICAL EXAM: VS:  BP 124/70 (BP Location: Left Arm, Patient Position: Sitting, Cuff Size: Normal)   Pulse 73   Ht 5\' 4"  (1.626 m)   Wt 197 lb (89.4 kg)   BMI 33.81 kg/m  , BMI Body mass index is 33.81 kg/m. GEN: Well nourished, well developed, in no acute distress HEENT: normal Neck: no JVD, carotid bruits, or masses Cardiac: RRR; no murmurs, rubs, or gallops,no edema  Respiratory:  Clear to auscultation bilaterally, normal work of breathing GI: soft, nontender, nondistended, + BS MS: no deformity or atrophy Skin: warm and dry, no rash Neuro:  Strength and sensation are intact Psych: euthymic mood, full affect. Easily  tearful    EKG:  NSR rate of 73 bpm.   Recent Labs: No results found for requested labs within last 8760 hours.    Lipid Panel    Component Value Date/Time   CHOL 224 (H) 05/30/2016 1256   TRIG 167 (H) 05/30/2016 1256   HDL 54 05/30/2016 1256   CHOLHDL 4.1 05/30/2016 1256   CHOLHDL 3.7 04/06/2015 1544   VLDL 33 (H) 04/06/2015 1544   LDLCALC 137 (H) 05/30/2016 1256      Wt Readings from Last 3 Encounters:  12/18/17 197 lb (89.4 kg)  04/05/17 194 lb 12.8 oz (88.4 kg)  04/03/16 188 lb 9.6 oz (85.5 kg)      Other studies Reviewed: Cardiac Cath 04/08/2015 1. Lat 1st Mrg lesion, 45% stenosed. 2. Mid LAD lesion, 45-55% stenosed. FFR 0.87. Non-Physiologically significant 3. The left ventricular systolic function is normal.    Angiographic moderate disease in the LAD and inferior branch of the OM. FFR evaluation of the LAD lesion was performed to confirm false positive stress test. The FFR was not physiologically significant.  Plan:  Standard post radial cath care with TR band removal per protocol  Anticipate discharge from PACU holding area/short stay after bedrest.  ASSESSMENT AND PLAN:  1. CAD: She is experiencing symptoms worrisome for angina, with known stenosis of the LAD found during cardiac cath in 2017. She has not changed her lifestyle concerning smoking, along with significant stress in her personal life with recent death of her husband.   She had an abnormal stress test in 2017, leading to cardiac cath. I would like to have her cath repeated. I have discussed this with Dr. Gwenlyn Found, who is DOD at the [place] office today. He has reviewed prior cath films in 2017. He is in agreement that repeating cardiac cath would be beneficial to ascertain for progressive CAD.   The patient understands that risks include but are not limited to stroke (1 in 1000), death (1 in 28), kidney failure [usually temporary] (1 in 500), bleeding (1 in 200), allergic reaction [possibly serious]  (1 in 200), and agrees to proceed.   The patient's daughter is a Therapist, sports at [employer] and will be accompanying her to the cath. This is scheduled with Dr. Martinique on 12/23/2017 at 10:30. She is prescribed NTG to use prn.   2. Ongoing tobacco abuse. She is counseled on smoking cessation. She is finding it difficult to quit during an emotionally stressful time, but verbalizes understanding of need to quit.   3. Hypercholesterolemia: I do not see recent labs concerning status. This will need to be assessed to maintain LDL < 70  Last labs recorded were in 05/30/2016 with LDL of 137 at that time.   Current medicines are reviewed at length with the patient today.    Labs/  tests ordered today include: Cardiac Cath with labs.   Phill Myron. West Pugh, ANP, AACC   12/18/2017 3:49 PM    [place] [address] Office [phone] Fax [phone]

## 2017-12-18 NOTE — H&P (View-Only) (Signed)
Cardiology Office Note   Date:  12/18/2017   ID:  Janet Johnston, Janet Johnston 11-26-1950, MRN [id number]  PCP:  Jani Gravel, MD  Cardiologist: Dr. Stanford Breed Chief Complaint  Patient presents with  . Follow-up  . Chest Pain    Pressure.     History of Present Illness: Janet Johnston is a 67 y.o. female who presents for ongoing assessment and management of CAD, eclasia of the abdominal aorta, cardiac cath in 2017 showed a 45% first marginal, 45%-55% mid LAD and normal LV function, FFR of the LAD not significant; HL, HTN, ongoing tobacco abuse   Was last seen by Dr Stanford Breed 04/05/2017. Crestor was increased to 5 mg daily from twice a week. She was counseled on tobacco cessation.   She comes today with complaints of worsening chest pain and pressure which occurs with exertion. She states just walking to the mailbox She also sometimes experiences chest discomfort which awakens her at night.   She unfortunately continues to smoke an is under a great deal of personal stress. She is easily tearful when discussing her problems.   Past Medical History:  Diagnosis Date  . COPD (chronic obstructive pulmonary disease) ([place])   . DJD (degenerative joint disease)   . Fibromyalgia   . GERD (gastroesophageal reflux disease)   . Hypertension   . Nephrolithiasis   . Rheumatoid arthritis [facility])     Past Surgical History:  Procedure Laterality Date  . CARDIAC CATHETERIZATION N/A 04/08/2015   Procedure: Left Heart Cath and Coronary Angiography;  Surgeon: [name], MD;  Location: [place] [facility];  Service: Cardiovascular;  Laterality: N/A;  . CARDIAC CATHETERIZATION N/A 04/08/2015   Procedure: Intravascular Pressure Wire/FFR Study;  Surgeon: [name], MD;  Location: [place] [facility];  Service: Cardiovascular;  Laterality: N/A;  . lower back       Current Outpatient Medications  Medication Sig Dispense Refill  . amLODipine (NORVASC) 10 MG tablet Take 1 tablet (10 mg total) by mouth  daily. 90 tablet 3  . aspirin EC 81 MG tablet Take 1 tablet (81 mg total) by mouth daily. 90 tablet 3  . bisoprolol (ZEBETA) 5 MG tablet Take 5 mg by mouth at bedtime.    . Coenzyme Q10 (CO Q 10) 100 MG CAPS Take by mouth.    . ergocalciferol (VITAMIN D2) 50000 UNITS capsule Take 1.25 Units by mouth every Saturday.     [name] FLUoxetine (PROZAC) 40 MG capsule Take 40 mg by mouth daily.    . Hydroxychloroquine Sulfate (PLAQUENIL PO) Take 1 tablet by mouth daily.    [name] losartan (COZAAR) 50 MG tablet Take 50 mg by mouth daily.    . Magnesium 500 MG TABS Take 500 mg by mouth daily.    . Multiple Vitamins-Minerals (CENTRUM ADULTS PO) Take 1 tablet by mouth daily.     No current facility-administered medications for this visit.     Allergies:   Lyrica [pregabalin]; Penicillins; and Sulfa antibiotics    Social History:  The patient  reports that she has been smoking. She has never used smokeless tobacco. She reports that she drinks alcohol. She reports that she does not use drugs.   Family History:  The patient's family history includes Diabetes in her mother; Emphysema in her father; Heart disease in her mother; Heart failure in her father and mother; Hypertension in her mother.    ROS: All other systems are reviewed and negative. Unless otherwise mentioned in H&P  PHYSICAL EXAM: VS:  BP 124/70 (BP Location: Left Arm, Patient Position: Sitting, Cuff Size: Normal)   Pulse 73   Ht 5\' 4"  (1.626 m)   Wt 197 lb (89.4 kg)   BMI 33.81 kg/m  , BMI Body mass index is 33.81 kg/m. GEN: Well nourished, well developed, in no acute distress HEENT: normal Neck: no JVD, carotid bruits, or masses Cardiac: RRR; no murmurs, rubs, or gallops,no edema  Respiratory:  Clear to auscultation bilaterally, normal work of breathing GI: soft, nontender, nondistended, + BS MS: no deformity or atrophy Skin: warm and dry, no rash Neuro:  Strength and sensation are intact Psych: euthymic mood, full affect. Easily  tearful    EKG:  NSR rate of 73 bpm.   Recent Labs: No results found for requested labs within last 8760 hours.    Lipid Panel    Component Value Date/Time   CHOL 224 (H) 05/30/2016 1256   TRIG 167 (H) 05/30/2016 1256   HDL 54 05/30/2016 1256   CHOLHDL 4.1 05/30/2016 1256   CHOLHDL 3.7 04/06/2015 1544   VLDL 33 (H) 04/06/2015 1544   LDLCALC 137 (H) 05/30/2016 1256      Wt Readings from Last 3 Encounters:  12/18/17 197 lb (89.4 kg)  04/05/17 194 lb 12.8 oz (88.4 kg)  04/03/16 188 lb 9.6 oz (85.5 kg)      Other studies Reviewed: Cardiac Cath 04/08/2015 1. Lat 1st Mrg lesion, 45% stenosed. 2. Mid LAD lesion, 45-55% stenosed. FFR 0.87. Non-Physiologically significant 3. The left ventricular systolic function is normal.    Angiographic moderate disease in the LAD and inferior branch of the OM. FFR evaluation of the LAD lesion was performed to confirm false positive stress test. The FFR was not physiologically significant.  Plan:  Standard post radial cath care with TR band removal per protocol  Anticipate discharge from PACU holding area/short stay after bedrest.  ASSESSMENT AND PLAN:  1. CAD: She is experiencing symptoms worrisome for angina, with known stenosis of the LAD found during cardiac cath in 2017. She has not changed her lifestyle concerning smoking, along with significant stress in her personal life with recent death of her husband.   She had an abnormal stress test in 2017, leading to cardiac cath. I would like to have her cath repeated. I have discussed this with Dr. Gwenlyn Found, who is DOD at the [place] office today. He has reviewed prior cath films in 2017. He is in agreement that repeating cardiac cath would be beneficial to ascertain for progressive CAD.   The patient understands that risks include but are not limited to stroke (1 in 1000), death (1 in 71), kidney failure [usually temporary] (1 in 500), bleeding (1 in 200), allergic reaction [possibly serious]  (1 in 200), and agrees to proceed.   The patient's daughter is a Therapist, sports at [employer] and will be accompanying her to the cath. This is scheduled with Dr. Martinique on 12/23/2017 at 10:30. She is prescribed NTG to use prn.   2. Ongoing tobacco abuse. She is counseled on smoking cessation. She is finding it difficult to quit during an emotionally stressful time, but verbalizes understanding of need to quit.   3. Hypercholesterolemia: I do not see recent labs concerning status. This will need to be assessed to maintain LDL < 70  Last labs recorded were in 05/30/2016 with LDL of 137 at that time.   Current medicines are reviewed at length with the patient today.    Labs/  tests ordered today include: Cardiac Cath with labs.   Phill Myron. West Pugh, ANP, AACC   12/18/2017 3:49 PM    [address] Office [phone] Fax [phone]

## 2017-12-18 NOTE — Patient Instructions (Signed)
     Diella Gillingham Griffee  12/18/2017   SCHEDULE FOLLOW UP APPOINTMENT IN 2 WEEKS WITH KATHRYN LAWRENCE DNP,AACC IF PRIMARY CARDIOLOGIST ([facility]) IS UNAVAILABLE.   You are scheduled for a Cardiac Catheterization on Monday, December 16 with Dr. Peter Martinique.  1. Please arrive at the [facility] (Main Entrance A) at [facility]: [address] [place], [place] [address] at 8:30 AM (This time is two hours before your procedure to ensure your preparation). Free valet parking service is available.   Special note: Every effort is made to have your procedure done on time. Please understand that emergencies sometimes delay scheduled procedures.  2. Diet: Do not eat solid foods after midnight.  The patient may have clear liquids until 5am upon the day of the procedure.  3. Labs: You will need to have blood drawn on Wednesday, December 11 at [place], [place] Open: [place] (Lunch 12:30 - 1:30)   Phone: [phone]. You do not need to be fasting.  4. Medication instructions in preparation for your procedure:  On the morning of your procedure, take your Aspirin and any morning medicines NOT listed above.  You may use sips of water.  5. Plan for one night stay--bring personal belongings. 6. Bring a current list of your medications and current insurance cards. 7. You MUST have a responsible person to drive you home. 8. Someone MUST be with you the first 24 hours after you arrive home or your discharge will be delayed. 9. Please wear clothes that are easy to get on and off and wear slip-on shoes.  Thank you for allowing [place] to care for you!   -- [place]

## 2017-12-19 ENCOUNTER — Telehealth: Payer: Self-pay | Admitting: *Deleted

## 2017-12-19 LAB — BASIC METABOLIC PANEL
BUN/Creatinine Ratio: 13 (ref 12–28)
BUN: 20 mg/dL (ref 8–27)
CO2: 23 mmol/L (ref 20–29)
Calcium: 9.6 mg/dL (ref 8.7–10.3)
Chloride: 104 mmol/L (ref 96–106)
Creatinine, Ser: 1.56 mg/dL — ABNORMAL HIGH (ref 0.57–1.00)
GFR calc Af Amer: 39 mL/min/{1.73_m2} — ABNORMAL LOW (ref >59)
GFR calc non Af Amer: 34 mL/min/{1.73_m2} — ABNORMAL LOW (ref >59)
Glucose: 121 mg/dL — ABNORMAL HIGH (ref 65–99)
Potassium: 4.5 mmol/L (ref 3.5–5.2)
Sodium: 142 mmol/L (ref 134–144)

## 2017-12-19 LAB — CBC
Hematocrit: 38.9 % (ref 34.0–46.6)
Hemoglobin: 12.7 g/dL (ref 11.1–15.9)
MCH: 28.4 pg (ref 26.6–33.0)
MCHC: 32.6 g/dL (ref 31.5–35.7)
MCV: 87 fL (ref 79–97)
Platelets: 238 10*3/uL (ref 150–450)
RBC: 4.47 x10E6/uL (ref 3.77–5.28)
RDW: 13 % (ref 12.3–15.4)
WBC: 8.3 10*3/uL (ref 3.4–10.8)

## 2017-12-19 NOTE — Telephone Encounter (Addendum)
Pt contacted pre-catheterization scheduled at [facility] for: Monday December 23, 2017 12 noon Verified arrival time and place: [place] Entrance A at: 7 AM-pre procedure hydration  No solid food after midnight prior to cath, clear liquids until 5 AM day of procedure. Contrast allergy: no Verified no diabetes medications:  Hold: Losartan-day before and day of procedure.  Except hold medication AM meds can be  taken pre-cath with sip of water including: ASA 81 mg  Confirm patient has responsible person to drive home post procedure and for 24 hours after you arrive home: yes

## 2017-12-23 ENCOUNTER — Other Ambulatory Visit: Payer: Self-pay

## 2017-12-23 ENCOUNTER — Encounter (HOSPITAL_COMMUNITY): Payer: Self-pay | Admitting: General Practice

## 2017-12-23 ENCOUNTER — Ambulatory Visit (HOSPITAL_COMMUNITY)
Admission: RE | Admit: 2017-12-23 | Discharge: 2017-12-24 | Disposition: A | Discharge status: Home / Self Care | Payer: Medicare Other | Attending: Cardiology | Admitting: Cardiology

## 2017-12-23 ENCOUNTER — Encounter (HOSPITAL_COMMUNITY)
Admission: RE | Disposition: A | Discharge status: Home / Self Care | Payer: Self-pay | Source: Home / Self Care | Attending: Cardiology

## 2017-12-23 DIAGNOSIS — F17200 Nicotine dependence, unspecified, uncomplicated: Secondary | ICD-10-CM | POA: Insufficient documentation

## 2017-12-23 DIAGNOSIS — N183 Chronic kidney disease, stage 3 unspecified: Secondary | ICD-10-CM | POA: Diagnosis present

## 2017-12-23 DIAGNOSIS — M069 Rheumatoid arthritis, unspecified: Secondary | ICD-10-CM | POA: Insufficient documentation

## 2017-12-23 DIAGNOSIS — Z7982 Long term (current) use of aspirin: Secondary | ICD-10-CM | POA: Diagnosis not present

## 2017-12-23 DIAGNOSIS — Z8249 Family history of ischemic heart disease and other diseases of the circulatory system: Secondary | ICD-10-CM | POA: Diagnosis not present

## 2017-12-23 DIAGNOSIS — Z836 Family history of other diseases of the respiratory system: Secondary | ICD-10-CM | POA: Diagnosis not present

## 2017-12-23 DIAGNOSIS — J449 Chronic obstructive pulmonary disease, unspecified: Secondary | ICD-10-CM | POA: Insufficient documentation

## 2017-12-23 DIAGNOSIS — Z888 Allergy status to other drugs, medicaments and biological substances status: Secondary | ICD-10-CM | POA: Insufficient documentation

## 2017-12-23 DIAGNOSIS — I129 Hypertensive chronic kidney disease with stage 1 through stage 4 chronic kidney disease, or unspecified chronic kidney disease: Secondary | ICD-10-CM | POA: Insufficient documentation

## 2017-12-23 DIAGNOSIS — K219 Gastro-esophageal reflux disease without esophagitis: Secondary | ICD-10-CM | POA: Insufficient documentation

## 2017-12-23 DIAGNOSIS — Z79899 Other long term (current) drug therapy: Secondary | ICD-10-CM | POA: Insufficient documentation

## 2017-12-23 DIAGNOSIS — Z882 Allergy status to sulfonamides status: Secondary | ICD-10-CM | POA: Insufficient documentation

## 2017-12-23 DIAGNOSIS — I25119 Atherosclerotic heart disease of native coronary artery with unspecified angina pectoris: Secondary | ICD-10-CM | POA: Diagnosis not present

## 2017-12-23 DIAGNOSIS — Z880 Allergy status to penicillin: Secondary | ICD-10-CM | POA: Diagnosis not present

## 2017-12-23 DIAGNOSIS — I209 Angina pectoris, unspecified: Secondary | ICD-10-CM

## 2017-12-23 DIAGNOSIS — E785 Hyperlipidemia, unspecified: Secondary | ICD-10-CM | POA: Insufficient documentation

## 2017-12-23 DIAGNOSIS — I10 Essential (primary) hypertension: Secondary | ICD-10-CM | POA: Diagnosis present

## 2017-12-23 DIAGNOSIS — I2510 Atherosclerotic heart disease of native coronary artery without angina pectoris: Secondary | ICD-10-CM

## 2017-12-23 DIAGNOSIS — M797 Fibromyalgia: Secondary | ICD-10-CM | POA: Diagnosis not present

## 2017-12-23 DIAGNOSIS — I2583 Coronary atherosclerosis due to lipid rich plaque: Secondary | ICD-10-CM

## 2017-12-23 DIAGNOSIS — Z955 Presence of coronary angioplasty implant and graft: Secondary | ICD-10-CM

## 2017-12-23 HISTORY — PX: LEFT HEART CATH AND CORONARY ANGIOGRAPHY: CATH118249

## 2017-12-23 HISTORY — DX: Unspecified asthma, uncomplicated: J45.909

## 2017-12-23 HISTORY — DX: Hyperlipidemia, unspecified: E78.5

## 2017-12-23 HISTORY — DX: Low back pain: M54.5

## 2017-12-23 HISTORY — DX: Unspecified chronic bronchitis: J42

## 2017-12-23 HISTORY — DX: Anxiety disorder, unspecified: F41.9

## 2017-12-23 HISTORY — DX: Chronic kidney disease, unspecified: N18.9

## 2017-12-23 HISTORY — DX: Tension-type headache, unspecified, not intractable: G44.209

## 2017-12-23 HISTORY — DX: Prediabetes: R73.03

## 2017-12-23 HISTORY — DX: Other chronic pain: G89.29

## 2017-12-23 HISTORY — DX: Sleep apnea, unspecified: G47.30

## 2017-12-23 HISTORY — DX: Major depressive disorder, single episode, unspecified: F32.9

## 2017-12-23 HISTORY — PX: CORONARY ANGIOPLASTY WITH STENT PLACEMENT: SHX49

## 2017-12-23 HISTORY — DX: Personal history of urinary calculi: Z87.442

## 2017-12-23 HISTORY — DX: Personal history of other diseases of the digestive system: Z87.19

## 2017-12-23 HISTORY — DX: Atherosclerotic heart disease of native coronary artery without angina pectoris: I25.10

## 2017-12-23 HISTORY — DX: Low back pain, unspecified: M54.50

## 2017-12-23 HISTORY — PX: CORONARY STENT INTERVENTION: CATH118234

## 2017-12-23 HISTORY — DX: Depression, unspecified: F32.A

## 2017-12-23 HISTORY — DX: Pneumonia, unspecified organism: J18.9

## 2017-12-23 SURGERY — LEFT HEART CATH AND CORONARY ANGIOGRAPHY
Anesthesia: LOCAL

## 2017-12-23 MED ORDER — CENTRUM ADULTS PO TABS: ORAL_TABLET | Freq: Every day | ORAL | Status: DC

## 2017-12-23 MED ORDER — SODIUM CHLORIDE 0.9% FLUSH
3.0000 mL | Freq: Two times a day (BID) | INTRAVENOUS | Status: DC
  Administered 2017-12-23: 22:00:00 3 mL via INTRAVENOUS

## 2017-12-23 MED ORDER — FENTANYL CITRATE (PF) 100 MCG/2ML IJ SOLN
INTRAMUSCULAR | Status: DC | PRN
  Administered 2017-12-23: 25 ug via INTRAVENOUS

## 2017-12-23 MED ORDER — ASPIRIN EC 81 MG PO TBEC: 81.0000 mg | DELAYED_RELEASE_TABLET | Freq: Every day | ORAL | Status: DC

## 2017-12-23 MED ORDER — LOSARTAN POTASSIUM 50 MG PO TABS
50.0000 mg | ORAL_TABLET | Freq: Every day | ORAL | Status: DC
  Administered 2017-12-23: 50 mg via ORAL
  Filled 2017-12-23: qty 1

## 2017-12-23 MED ORDER — HYDROXYCHLOROQUINE SULFATE 200 MG PO TABS
200.0000 mg | ORAL_TABLET | Freq: Every day | ORAL | Status: DC
  Administered 2017-12-23: 17:00:00 200 mg via ORAL
  Filled 2017-12-23 (×2): qty 1

## 2017-12-23 MED ORDER — HEPARIN (PORCINE) IN NACL 1000-0.9 UT/500ML-% IV SOLN
INTRAVENOUS | Status: DC | PRN
  Administered 2017-12-23 (×2): 500 mL

## 2017-12-23 MED ORDER — IOHEXOL 350 MG/ML SOLN
INTRAVENOUS | Status: DC | PRN
  Administered 2017-12-23: 120 mL

## 2017-12-23 MED ORDER — ACETAMINOPHEN 325 MG PO TABS: 650.0000 mg | ORAL_TABLET | ORAL | Status: DC | PRN

## 2017-12-23 MED ORDER — ADULT MULTIVITAMIN W/MINERALS CH: 1.0000 | ORAL_TABLET | Freq: Every day | ORAL | Status: DC

## 2017-12-23 MED ORDER — LIDOCAINE HCL (PF) 1 % IJ SOLN
INTRAMUSCULAR | Status: AC
  Filled 2017-12-23: qty 30

## 2017-12-23 MED ORDER — TICAGRELOR 90 MG PO TABS: 90.0000 mg | ORAL_TABLET | Freq: Two times a day (BID) | ORAL | Status: DC

## 2017-12-23 MED ORDER — NITROGLYCERIN 0.4 MG SL SUBL: 0.4000 mg | SUBLINGUAL_TABLET | SUBLINGUAL | Status: DC | PRN

## 2017-12-23 MED ORDER — TICAGRELOR 90 MG PO TABS
90.0000 mg | ORAL_TABLET | Freq: Once | ORAL | Status: AC
  Administered 2017-12-24: 01:00:00 90 mg via ORAL
  Filled 2017-12-23: qty 1

## 2017-12-23 MED ORDER — NITROGLYCERIN 1 MG/10 ML FOR IR/CATH LAB
INTRA_ARTERIAL | Status: DC | PRN
  Administered 2017-12-23 (×2): 200 ug via INTRACORONARY

## 2017-12-23 MED ORDER — FLUOXETINE HCL 20 MG PO CAPS: 40.0000 mg | ORAL_CAPSULE | Freq: Every day | ORAL | Status: DC

## 2017-12-23 MED ORDER — TICAGRELOR 90 MG PO TABS
ORAL_TABLET | ORAL | Status: DC | PRN
  Administered 2017-12-23: 180 mg via ORAL

## 2017-12-23 MED ORDER — SODIUM CHLORIDE 0.9% FLUSH: 3.0000 mL | INTRAVENOUS | Status: DC | PRN

## 2017-12-23 MED ORDER — SODIUM CHLORIDE 0.9 % WEIGHT BASED INFUSION
3.0000 mL/kg/h | INTRAVENOUS | Status: DC
  Administered 2017-12-23: 3 mL/kg/h via INTRAVENOUS

## 2017-12-23 MED ORDER — FLUOXETINE HCL 40 MG PO CAPS: 40.0000 mg | ORAL_CAPSULE | Freq: Every day | ORAL | Status: DC

## 2017-12-23 MED ORDER — MIDAZOLAM HCL 2 MG/2ML IJ SOLN
INTRAMUSCULAR | Status: AC
  Filled 2017-12-23: qty 2

## 2017-12-23 MED ORDER — HEPARIN (PORCINE) IN NACL 1000-0.9 UT/500ML-% IV SOLN
INTRAVENOUS | Status: AC
  Filled 2017-12-23: qty 1000

## 2017-12-23 MED ORDER — VERAPAMIL HCL 2.5 MG/ML IV SOLN
INTRAVENOUS | Status: DC | PRN
  Administered 2017-12-23: 10 mL via INTRA_ARTERIAL

## 2017-12-23 MED ORDER — ASPIRIN 81 MG PO CHEW: 81.0000 mg | CHEWABLE_TABLET | ORAL | Status: DC

## 2017-12-23 MED ORDER — CO Q 10 100 MG PO CAPS: 100.0000 mg | ORAL_CAPSULE | Freq: Every day | ORAL | Status: DC

## 2017-12-23 MED ORDER — SODIUM CHLORIDE 0.9 % WEIGHT BASED INFUSION: 1.0000 mL/kg/h | INTRAVENOUS | Status: DC

## 2017-12-23 MED ORDER — HEPARIN SODIUM (PORCINE) 1000 UNIT/ML IJ SOLN
INTRAMUSCULAR | Status: AC
  Filled 2017-12-23: qty 1

## 2017-12-23 MED ORDER — ONDANSETRON HCL 4 MG/2ML IJ SOLN: 4.0000 mg | Freq: Four times a day (QID) | INTRAMUSCULAR | Status: DC | PRN

## 2017-12-23 MED ORDER — MAGNESIUM 500 MG PO TABS: 500.0000 mg | ORAL_TABLET | Freq: Every day | ORAL | Status: DC

## 2017-12-23 MED ORDER — ANGIOPLASTY BOOK
Freq: Once | Status: AC
  Administered 2017-12-23: 22:00:00
  Filled 2017-12-23: qty 1

## 2017-12-23 MED ORDER — AMLODIPINE BESYLATE 10 MG PO TABS: 10.0000 mg | ORAL_TABLET | Freq: Every day | ORAL | Status: DC

## 2017-12-23 MED ORDER — LABETALOL HCL 5 MG/ML IV SOLN: 10.0000 mg | INTRAVENOUS | Status: AC | PRN

## 2017-12-23 MED ORDER — BISOPROLOL FUMARATE 5 MG PO TABS
5.0000 mg | ORAL_TABLET | Freq: Every day | ORAL | Status: DC
  Administered 2017-12-23: 5 mg via ORAL
  Filled 2017-12-23: qty 1

## 2017-12-23 MED ORDER — MAGNESIUM OXIDE 400 (241.3 MG) MG PO TABS: 400.0000 mg | ORAL_TABLET | Freq: Every day | ORAL | Status: DC

## 2017-12-23 MED ORDER — SODIUM CHLORIDE 0.9 % IV SOLN: 250.0000 mL | INTRAVENOUS | Status: DC | PRN

## 2017-12-23 MED ORDER — FENTANYL CITRATE (PF) 100 MCG/2ML IJ SOLN
INTRAMUSCULAR | Status: AC
  Filled 2017-12-23: qty 2

## 2017-12-23 MED ORDER — HEPARIN SODIUM (PORCINE) 1000 UNIT/ML IJ SOLN
INTRAMUSCULAR | Status: DC | PRN
  Administered 2017-12-23 (×2): 4500 [IU] via INTRAVENOUS

## 2017-12-23 MED ORDER — VERAPAMIL HCL 2.5 MG/ML IV SOLN
INTRAVENOUS | Status: AC
  Filled 2017-12-23: qty 2

## 2017-12-23 MED ORDER — SODIUM CHLORIDE 0.9 % WEIGHT BASED INFUSION
1.0000 mL/kg/h | INTRAVENOUS | Status: AC
  Administered 2017-12-23: 14:00:00 1 mL/kg/h via INTRAVENOUS

## 2017-12-23 MED ORDER — HYDRALAZINE HCL 20 MG/ML IJ SOLN: 5.0000 mg | INTRAMUSCULAR | Status: AC | PRN

## 2017-12-23 MED ORDER — ERGOCALCIFEROL 1.25 MG (50000 UT) PO CAPS: 50000.0000 [IU] | ORAL_CAPSULE | ORAL | Status: DC

## 2017-12-23 MED ORDER — MIDAZOLAM HCL 2 MG/2ML IJ SOLN
INTRAMUSCULAR | Status: DC | PRN
  Administered 2017-12-23: 1 mg via INTRAVENOUS

## 2017-12-23 MED ORDER — VITAMIN D (ERGOCALCIFEROL) 1.25 MG (50000 UNIT) PO CAPS: 50000.0000 [IU] | ORAL_CAPSULE | ORAL | Status: DC

## 2017-12-23 MED ORDER — PREDNISONE 5 MG PO TABS
5.0000 mg | ORAL_TABLET | Freq: Every day | ORAL | Status: DC
  Administered 2017-12-24: 5 mg via ORAL
  Filled 2017-12-23: qty 1

## 2017-12-23 MED ORDER — SODIUM CHLORIDE 0.9% FLUSH: 3.0000 mL | Freq: Two times a day (BID) | INTRAVENOUS | Status: DC

## 2017-12-23 MED ORDER — LIDOCAINE HCL (PF) 1 % IJ SOLN
INTRAMUSCULAR | Status: DC | PRN
  Administered 2017-12-23: 2 mL

## 2017-12-23 SURGICAL SUPPLY — 16 items
BALLN SAPPHIRE 2.5X15 (BALLOONS) ×2
BALLN SAPPHIRE ~~LOC~~ 3.5X18 (BALLOONS) ×2 IMPLANT
BALLOON SAPPHIRE 2.5X15 (BALLOONS) ×1 IMPLANT
CATH 5FR JL3.5 JR4 ANG PIG MP (CATHETERS) ×2 IMPLANT
CATH VISTA GUIDE 6FR XBLAD3.5 (CATHETERS) ×2 IMPLANT
DEVICE RAD COMP TR BAND LRG (VASCULAR PRODUCTS) ×2 IMPLANT
GLIDESHEATH SLEND SS 6F .021 (SHEATH) ×2 IMPLANT
GUIDEWIRE INQWIRE 1.5J.035X260 (WIRE) ×1 IMPLANT
INQWIRE 1.5J .035X260CM (WIRE) ×2
KIT ENCORE 26 ADVANTAGE (KITS) ×2 IMPLANT
KIT HEART LEFT (KITS) ×2 IMPLANT
PACK CARDIAC CATHETERIZATION (CUSTOM PROCEDURE TRAY) ×2 IMPLANT
STENT SYNERGY DES 3X24 (Permanent Stent) ×2 IMPLANT
TRANSDUCER W/STOPCOCK (MISCELLANEOUS) ×2 IMPLANT
TUBING CIL FLEX 10 FLL-RA (TUBING) ×2 IMPLANT
WIRE ASAHI PROWATER 180CM (WIRE) ×2 IMPLANT

## 2017-12-23 NOTE — Care Management (Signed)
#  10.   S/W CRYSTAL @ [employer] # [phone]  BRILINTA  90 MG BID COVER- YES CO-PAY- $ 45.00 TIER- 3 DRUG PRIOR APPROVAL- NO  DEDUCTIBLE : NOT  MET / COVERAGE GAP  PREFERRED PHARMACY : YES - [facility]

## 2017-12-23 NOTE — Progress Notes (Signed)
Thank you, I know she feels better. KL

## 2017-12-23 NOTE — Interval H&P Note (Signed)
History and Physical Interval Note:  12/23/2017 12:48 PM  Janet Johnston  has presented today for surgery, with the diagnosis of lad blockage - cad - angina  The various methods of treatment have been discussed with the patient and family. After consideration of risks, benefits and other options for treatment, the patient has consented to  Procedure(s): LEFT HEART CATH AND CORONARY ANGIOGRAPHY (N/A) as a surgical intervention .  The patient's history has been reviewed, patient examined, no change in status, stable for surgery.  I have reviewed the patient's chart and labs.  Questions were answered to the patient's satisfaction.    Cath Lab Visit (complete for each Cath Lab visit)  Clinical Evaluation Leading to the Procedure:   ACS: No.  Non-ACS:    Anginal Classification: CCS III  Anti-ischemic medical therapy: Maximal Therapy (2 or more classes of medications)  Non-Invasive Test Results: No non-invasive testing performed  Prior CABG: No previous CABG       Collier Salina [facility] 12/23/2017 12:48 PM

## 2017-12-24 ENCOUNTER — Encounter (HOSPITAL_COMMUNITY): Payer: Self-pay | Admitting: Cardiology

## 2017-12-24 DIAGNOSIS — Z888 Allergy status to other drugs, medicaments and biological substances status: Secondary | ICD-10-CM | POA: Diagnosis not present

## 2017-12-24 DIAGNOSIS — M797 Fibromyalgia: Secondary | ICD-10-CM | POA: Diagnosis not present

## 2017-12-24 DIAGNOSIS — I209 Angina pectoris, unspecified: Secondary | ICD-10-CM | POA: Diagnosis not present

## 2017-12-24 DIAGNOSIS — N183 Chronic kidney disease, stage 3 unspecified: Secondary | ICD-10-CM | POA: Diagnosis present

## 2017-12-24 DIAGNOSIS — Z8249 Family history of ischemic heart disease and other diseases of the circulatory system: Secondary | ICD-10-CM | POA: Diagnosis not present

## 2017-12-24 DIAGNOSIS — Z836 Family history of other diseases of the respiratory system: Secondary | ICD-10-CM | POA: Diagnosis not present

## 2017-12-24 DIAGNOSIS — Z882 Allergy status to sulfonamides status: Secondary | ICD-10-CM | POA: Diagnosis not present

## 2017-12-24 DIAGNOSIS — I129 Hypertensive chronic kidney disease with stage 1 through stage 4 chronic kidney disease, or unspecified chronic kidney disease: Secondary | ICD-10-CM | POA: Diagnosis not present

## 2017-12-24 DIAGNOSIS — I25119 Atherosclerotic heart disease of native coronary artery with unspecified angina pectoris: Secondary | ICD-10-CM | POA: Diagnosis not present

## 2017-12-24 DIAGNOSIS — E785 Hyperlipidemia, unspecified: Secondary | ICD-10-CM | POA: Diagnosis not present

## 2017-12-24 DIAGNOSIS — Z880 Allergy status to penicillin: Secondary | ICD-10-CM | POA: Diagnosis not present

## 2017-12-24 DIAGNOSIS — K219 Gastro-esophageal reflux disease without esophagitis: Secondary | ICD-10-CM | POA: Diagnosis not present

## 2017-12-24 DIAGNOSIS — Z79899 Other long term (current) drug therapy: Secondary | ICD-10-CM | POA: Diagnosis not present

## 2017-12-24 DIAGNOSIS — Z7982 Long term (current) use of aspirin: Secondary | ICD-10-CM | POA: Diagnosis not present

## 2017-12-24 DIAGNOSIS — M069 Rheumatoid arthritis, unspecified: Secondary | ICD-10-CM | POA: Diagnosis not present

## 2017-12-24 DIAGNOSIS — J449 Chronic obstructive pulmonary disease, unspecified: Secondary | ICD-10-CM | POA: Diagnosis not present

## 2017-12-24 LAB — BASIC METABOLIC PANEL
Anion gap: 13 (ref 5–15)
BUN: 24 mg/dL — ABNORMAL HIGH (ref 8–23)
CO2: 21 mmol/L — ABNORMAL LOW (ref 22–32)
Calcium: 8.9 mg/dL (ref 8.9–10.3)
Chloride: 106 mmol/L (ref 98–111)
Creatinine, Ser: 1.57 mg/dL — ABNORMAL HIGH (ref 0.44–1.00)
GFR calc Af Amer: 39 mL/min — ABNORMAL LOW (ref >60)
GFR calc non Af Amer: 34 mL/min — ABNORMAL LOW (ref >60)
Glucose, Bld: 78 mg/dL (ref 70–99)
Potassium: 3.9 mmol/L (ref 3.5–5.1)
Sodium: 140 mmol/L (ref 135–145)

## 2017-12-24 LAB — POCT ACTIVATED CLOTTING TIME: Activated Clotting Time: 301 seconds

## 2017-12-24 LAB — CBC
HCT: 39.1 % (ref 36.0–46.0)
Hemoglobin: 12.5 g/dL (ref 12.0–15.0)
MCH: 28.2 pg (ref 26.0–34.0)
MCHC: 32 g/dL (ref 30.0–36.0)
MCV: 88.1 fL (ref 80.0–100.0)
Platelets: UNDETERMINED 10*3/uL (ref 150–400)
RBC: 4.44 MIL/uL (ref 3.87–5.11)
RDW: 13.1 % (ref 11.5–15.5)
WBC: 8.7 10*3/uL (ref 4.0–10.5)
nRBC: 0 % (ref 0.0–0.2)

## 2017-12-24 MED ORDER — EZETIMIBE 10 MG PO TABS: 10.0000 mg | ORAL_TABLET | Freq: Every day | ORAL | 2 refills | Status: DC

## 2017-12-24 MED ORDER — TICAGRELOR 90 MG PO TABS: 90.0000 mg | ORAL_TABLET | Freq: Two times a day (BID) | ORAL | 11 refills | Status: DC

## 2017-12-24 MED FILL — EZETIMIBE 10 MG TABS: 10 | 30 days supply | Qty: 30 | Fill #0 | Status: TO

## 2017-12-24 MED FILL — BRILINTA 90 MG TABLET: 90 | 30 days supply | Qty: 60 | Fill #0 | Status: TO

## 2017-12-24 NOTE — Discharge Summary (Addendum)
Discharge Summary    Patient ID: Janet Johnston MRN: [id number]; DOB: 1950-10-14  Admit date: 12/23/2017 Discharge date: 12/24/2017  Primary Care Provider: Jani Gravel, MD  Primary Cardiologist: Kirk Ruths, MD  Primary Electrophysiologist:  None   Discharge Diagnoses    Principal Problem:   Angina, class III [facility]) Active Problems:   Essential hypertension   CAD (coronary artery disease)   Hyperlipidemia   Angina pectoris ([place])   CKD (chronic kidney disease), stage III ([place])   Allergies Allergies  Allergen Reactions  . Lyrica [Pregabalin] Itching and Rash  . Penicillins Itching and Rash    Has patient had a PCN reaction causing immediate rash, facial/tongue/throat swelling, SOB or lightheadedness with hypotension: No Has patient had a PCN reaction causing severe rash involving mucus membranes or skin necrosis: No Has patient had a PCN reaction that required hospitalization: No Has patient had a PCN reaction occurring within the last 10 years: No If all of the above answers are "NO", then may proceed with Cephalosporin use.   . Sulfa Antibiotics Itching and Rash    Diagnostic Studies/Procedures    Left Heart Catheterization 12/23/2017:  Prox LAD to Mid LAD lesion is 95% stenosed.  Post intervention, there is a 0% residual stenosis.  A drug-eluting stent was successfully placed using a STENT SYNERGY DES 3X24.  The left ventricular systolic function is normal.  LV end diastolic pressure is normal.  The left ventricular ejection fraction is 55-65% by visual estimate.   1. Single vessel obstructive CAD involving the mid LAD 2. Normal LV function 3. Normal LVEDP 4. Successful PCI of the LAD with DES x 1.   Plan: DAPT for one year. Will watch overnight with IV hydration due to CKD. Anticipate DC in am.    History of Present Illness     Ms. Janet Johnston is a 67 year old female with a history of non-obstructive CAD on cardiac catheterization in 2017,  hypertension, hyperlipidemia, tobacco abuse, GERD, COPD, rheumatoid arthritis, and fibromyalgia who is followed by Dr. Stanford Breed. Patient was last seen by Jory Sims, NP, in the office on 12/18/2017 at which time she was complaining of worsening chest pain and pressure with minimal exertion, such as walking to the mailbox. She also noted occasional chest discomfort that wakes her up at night. At that visit, she also reported continued tobacco use and stated she was under significant stress with the recent death of her husband. Given patient's symptoms with known stenosis of LAD on cardiac catheterization in 2017, Jory Sims, NP, felt like the patient needed a another catheterization. The plan was discussed with Dr. Gwenlyn Found, who was the DOD in the office, and he agreed with proceeding with catheterization.  Hospital Course     Consultants: None.  Patient presented on 12/23/2017 for planned left cardiac catheterization as noted above. Procedure showed single vessel obstructive CAD with 95% stenosis of the proximal to mid LAD. Patient underwent successful PCI with DES to LAD. Patient was admitted overnight for IV hydration given CKD. Patient tolerated the procedure well. Patient reports feeling well this morning with only minimal soreness at cath site. Serum creatinine 1.57 today which is similar to what it was on 12/18/2017. - Continue dual-antiplatelet therapy with Aspirin 81mg  daily and Brilinta 90mg  twice daily. - Continue Amlodipine 10mg  daily and Losartan 50mg  daily for hypertension. - Patient intolerant to multiple statins in the past. Will start Zetia 10mg  daily. Consider outpatient PCSK9 inhibitor. Will need repeat lipid panel and CMP in about  6 weeks.   MD to see prior to discharge. Outpatient follow-up has been arranged. Medications as below. _____________  Discharge Vitals Blood pressure 98/70, pulse 60, temperature 98.1 F (36.7 C), temperature source Oral, resp. rate 15, height 5'  4" (1.626 m), weight 88 kg, SpO2 98 %.  Filed Weights   12/23/17 0712 12/24/17 0641  Weight: 88.9 kg 88 kg   General: Well developed, well nourished, female resting comfortably in no acute distress Head: Normocephalic and atraumatic. Eyes: Sclera clear. No xanthomas. Lungs: Clear bilaterally to auscultation. No wheezes, rhonchi, or rales. Heart: Borderline bradycardic with regular rhythm. Distinct S1 and S2. No murmurs, gallops, or rubs. Radial and distal pedal pulses 2+ and equal bilaterally. Right radial cath site soft with no signs of hematoma. Abdomen: Soft, non-distended, and non-tender. Bowel sounds are present. Msk: Normal strength and tone for age. Extremities: No significant lower extremity edema.    Skin: Warm and dry. Neuro: Alert and oriented x3. Psych:  Good affect. Responds appropriately.  Labs & Radiologic Studies    CBC Recent Labs    12/24/17 0458  WBC 8.7  HGB 12.5  HCT 39.1  MCV 88.1  PLT PLATELET CLUMPS NOTED ON SMEAR, UNABLE TO ESTIMATE   Basic Metabolic Panel Recent Labs    12/24/17 0458  NA 140  K 3.9  CL 106  CO2 21*  GLUCOSE 78  BUN 24*  CREATININE 1.57*  CALCIUM 8.9   Liver Function Tests No results for input(s): AST, ALT, ALKPHOS, BILITOT, PROT, ALBUMIN in the last 72 hours. No results for input(s): LIPASE, AMYLASE in the last 72 hours. Cardiac Enzymes No results for input(s): CKTOTAL, CKMB, CKMBINDEX, TROPONINI in the last 72 hours. BNP Invalid input(s): POCBNP D-Dimer No results for input(s): DDIMER in the last 72 hours. Hemoglobin A1C No results for input(s): HGBA1C in the last 72 hours. Fasting Lipid Panel No results for input(s): CHOL, HDL, LDLCALC, TRIG, CHOLHDL, LDLDIRECT in the last 72 hours. Thyroid Function Tests No results for input(s): TSH, T4TOTAL, T3FREE, THYROIDAB in the last 72 hours.  Invalid input(s): FREET3 _____________  No results found. Disposition   Patient is being discharged home today in good  condition.  Follow-up Plans & Appointments    Follow-up Information    [facility] Follow up.   Specialty:  Cardiology Why:  You have a follow-up visit scheduled on 01/06/2018 at 3:30pm with Jory Sims, NP. Contact information: [address] [place] [phone]         Discharge Instructions    AMB Referral to Cardiac Rehabilitation - Phase II   Complete by:  As directed    Diagnosis:  Coronary Stents   Diet - low sodium heart healthy   Complete by:  As directed    Increase activity slowly   Complete by:  As directed       Discharge Medications   Allergies as of 12/24/2017      Reactions   Lyrica [pregabalin] Itching, Rash   Penicillins Itching, Rash   Has patient had a PCN reaction causing immediate rash, facial/tongue/throat swelling, SOB or lightheadedness with hypotension: No Has patient had a PCN reaction causing severe rash involving mucus membranes or skin necrosis: No Has patient had a PCN reaction that required hospitalization: No Has patient had a PCN reaction occurring within the last 10 years: No If all of the above answers are "NO", then may proceed with Cephalosporin use.   Sulfa Antibiotics Itching, Rash  Medication List    TAKE these medications   amLODipine 10 MG tablet Commonly known as:  NORVASC Take 1 tablet (10 mg total) by mouth daily.   aspirin EC 81 MG tablet Take 1 tablet (81 mg total) by mouth daily.   bisoprolol 5 MG tablet Commonly known as:  ZEBETA Take 5 mg by mouth at bedtime.   CENTRUM ADULTS PO Take 1 tablet by mouth daily.   clonazePAM 0.5 MG tablet Commonly known as:  KLONOPIN Take 0.25-0.5 mg by mouth daily as needed for anxiety.   Co Q 10 100 MG Caps Take 100 mg by mouth daily.   ergocalciferol 1.25 MG (50000 UT) capsule Commonly known as:  VITAMIN D2 Take 50,000 Units by mouth once a week.   ezetimibe 10 MG tablet Commonly known as:  ZETIA Take  1 tablet (10 mg total) by mouth daily.   FLUoxetine 40 MG capsule Commonly known as:  PROZAC Take 40 mg by mouth daily.   losartan 50 MG tablet Commonly known as:  COZAAR Take 50 mg by mouth daily.   Magnesium 500 MG Tabs Take 500 mg by mouth daily.   nitroGLYCERIN 0.4 MG SL tablet Commonly known as:  NITROSTAT Place 1 tablet (0.4 mg total) under the tongue every 5 (five) minutes as needed for chest pain.   PLAQUENIL 200 MG tablet Generic drug:  hydroxychloroquine Take 200 mg by mouth daily.   predniSONE 5 MG tablet Commonly known as:  DELTASONE Take 5 mg by mouth daily with breakfast.   ticagrelor 90 MG Tabs tablet Commonly known as:  BRILINTA Take 1 tablet (90 mg total) by mouth 2 (two) times daily.         Outstanding Labs/Studies   Will need to repeat lipid panel and CMP in about 6 weeks after starting Zetia.  Duration of Discharge Encounter   Greater than 30 minutes including physician time.  Signed, Darreld Mclean, PA-C  12/24/2017, 9:22 AM   I have personally seen and examined this patient with Sande Rives, PA-C. I agree with the assessment and plan as outlined above. Ms. Janet Johnston was admitted following cardiac cath yesterday. She was found to have a severe proximal to mid LAD stenosis that was treated with a DES x 1. LV systolic function normal. She is doing well this am. No chest pain. Plan to d/c home today with ASA/Brilinta for one year. Will continue Zetia. Consider PCSK9 inh therapy given statin intolerance.   Lauree Chandler 12/24/2017 9:33 AM

## 2017-12-24 NOTE — Discharge Instructions (Addendum)
Post Cardiac Catheterization: NO HEAVY LIFTING OR SEXUAL ACTIVITY X 7 DAYS. NO DRIVING X 3-5 DAYS. NO SOAKING BATHS, HOT TUBS, POOLS, ETC., X 7 DAYS.  Radial Site Care: Refer to this sheet in the next few weeks. These instructions provide you with information on caring for yourself after your procedure. Your caregiver may also give you more specific instructions. Your treatment has been planned according to current medical practices, but problems sometimes occur. Call your caregiver if you have any problems or questions after your procedure. HOME CARE INSTRUCTIONS  You may shower the day after the procedure.Remove the bandage (dressing) and gently wash the site with plain soap and water.Gently pat the site dry.   Do not apply powder or lotion to the site.   Do not submerge the affected site in water for 3 to 5 days.   Inspect the site at least twice daily.   Do not flex or bend the affected arm for 24 hours.   No lifting over 5 pounds (2.3 kg) for 5 days after your procedure.   Do not drive home if you are discharged the same day of the procedure. Have someone else drive you.  What to expect:  Any bruising will usually fade within 1 to 2 weeks.   Blood that collects in the tissue (hematoma) may be painful to the touch. It should usually decrease in size and tenderness within 1 to 2 weeks.  SEEK IMMEDIATE MEDICAL CARE IF:  You have unusual pain at the radial site.   You have redness, warmth, swelling, or pain at the radial site.   You have drainage (other than a small amount of blood on the dressing).   You have chills.   You have a fever or persistent symptoms for more than 72 hours.   You have a fever and your symptoms suddenly get worse.   Your arm becomes pale, cool, tingly, or numb.   You have heavy bleeding from the site. Hold pressure on the site.    - - - - - - - - - - - -  - - - - - -  - - - - - - - - - - - -  - - - - - -  - - - - - - - - - - - -  - - - - - -   - - - - - -  - - - - - - - - - - - -  - - - - - - - - - - - - - - - - - -  Information about your medication: Brilinta (anti-platelet agent)  Generic Name (Brand): ticagrelor (Brilinta), twice daily medication  PURPOSE: You are taking this medication along with aspirin to lower your chance of having a heart attack, stroke, or blood clots in your heart stent. These can be fatal. Brilinta and aspirin help prevent platelets from sticking together and forming a clot that can block an artery or your stent.   Common SIDE EFFECTS you may experience include: bruising or bleeding more easily, shortness of breath  Do not stop taking BRILINTA without talking to the doctor who prescribes it for you. People who are treated with a stent and stop taking Brilinta too soon, have a higher risk of getting a blood clot in the stent, having a heart attack, or dying. If you stop Brilinta because of bleeding, or for other reasons, your risk of a heart attack or stroke may increase.  Tell all of your doctors and dentists that you are taking Brilinta. They should talk to the doctor who prescribed Brilinta for you before you have any surgery or invasive procedure.   Contact your health care provider if you experience: severe or uncontrollable bleeding, pink/red/brown urine, vomiting blood or vomit that looks like "coffee grounds", red or black stools (looks like tar), coughing up blood or blood clots ----------------------------------------------------------------------------------------------------------------------

## 2017-12-24 NOTE — Progress Notes (Signed)
CARDIAC REHAB PHASE I   PRE:  Rate/Rhythm: 60 SR  BP:  Supine:   Sitting: 116/76  Standing:    SaO2: 98%RA  MODE:  Ambulation: 500 ft   POST:  Rate/Rhythm: 83 SR  BP:  Supine:   Sitting: 130/79  Standing:    SaO2: 100%RA 0910-1005 Pt walked 500 ft on RA with minimal asst. No CP and tolerated well. Stressed importance of brilinta with stent. Reviewed NTG use, ex ed, heart healthy food choices, CRP 2 and smoking cessation. Pt not sure if she will be able to quit as she is under stress and smoking helps her to deal with it. Gave handout and discussed how smoking can affect CAD. Referred to [place] CRP 2. Think this program would be very good as support for pt.    Graylon Good, RN BSN  12/24/2017 10:00 AM

## 2017-12-31 ENCOUNTER — Telehealth (HOSPITAL_COMMUNITY): Payer: Self-pay

## 2017-12-31 NOTE — Telephone Encounter (Signed)
Pt insurance is active and benefits verified through [facility]. Co-pay $20.00, DED $0.00/$0.00 met, out of pocket $4,400.00/$559.28 met, co-insurance 0%. No pre-authorization required. Passport, 12/31/17 @ 12:58PM, REF# [id number]

## 2018-01-06 ENCOUNTER — Ambulatory Visit: Payer: Medicare Other | Admitting: Adult Health

## 2018-01-06 ENCOUNTER — Encounter: Payer: Self-pay | Admitting: Adult Health

## 2018-01-06 VITALS — BP 142/80 | HR 61 | Ht 64.0 in | Wt 193.8 lb

## 2018-01-06 DIAGNOSIS — I10 Essential (primary) hypertension: Secondary | ICD-10-CM | POA: Diagnosis not present

## 2018-01-06 DIAGNOSIS — E7800 Pure hypercholesterolemia, unspecified: Secondary | ICD-10-CM

## 2018-01-06 DIAGNOSIS — I2510 Atherosclerotic heart disease of native coronary artery without angina pectoris: Secondary | ICD-10-CM | POA: Diagnosis not present

## 2018-01-06 NOTE — Progress Notes (Signed)
Cardiology Office Note   Date:  01/06/2018   ID:  Whitley, Patchen 1950-06-09, MRN [id number]  PCP:  Jani Gravel, MD  Cardiologist:  Dr. Stanford Breed  No chief complaint on file.    History of Present Illness: Janet Johnston is a 67 y.o. female who presents for post hospital follow up after undergoing cardiac cath in the setting of recurrent chest pain, She has a history of CAD, eclasia of the abdominal aorta, HTN, HL and tobacco cessation.  She was scheduled for cardiac cath due to worsening chest pressure and dyspnea.   Cath completed on 12/23/2017 per Dr. Martinique revealed proximal LAD stenosis, requiring a DES reducing stenosis to 0%. LVEF 55%-65%. She was placed on DAPT with ASA and Brilinta, started on Zetia 10 mg daily. She was continued on losartan and amlodipine. She will need follow up CMP in one month with lipids.   She is without any complaints today. Her breathing is vastly improved, she has no chest pressure. She is very grateful to the cardiology team for their care. She offers no complaints of bleeding, but has a considerable amount of bruising.   Past Medical History:  Diagnosis Date  . Anxiety   . Asthmatic bronchitis   . Chronic bronchitis ([place])    "get it q yr; we head w/wood stove" (12/23/2017)  . Chronic kidney disease    "Creatinine level elevated last couple years" (12/23/2017)  . Chronic lower back pain   . COPD (chronic obstructive pulmonary disease) ([place])   . Coronary artery disease    a. LHC 12/23/2017: pLAD to mLAD 95% s/p DES  . Depression   . DJD (degenerative joint disease)   . Fibromyalgia   . GERD (gastroesophageal reflux disease)   . History of hiatal hernia   . History of kidney stones   . Hyperlipidemia   . Hypertension   . Pneumonia   . Prediabetes   . Rheumatoid arthritis ([place])   . Sleep apnea    "home test indicated that I had it; I've never used mask" (12/23/2017)  . Tension headache    "controlled" (12/23/2017)    Past Surgical  History:  Procedure Laterality Date  . BACK SURGERY    . CARDIAC CATHETERIZATION N/A 04/08/2015   Procedure: Left Heart Cath and Coronary Angiography;  Surgeon: [name], MD;  Location: [place] [facility];  Service: Cardiovascular;  Laterality: N/A;  . CARDIAC CATHETERIZATION N/A 04/08/2015   Procedure: Intravascular Pressure Wire/FFR Study;  Surgeon: [name], MD;  Location: [place] [facility];  Service: Cardiovascular;  Laterality: N/A;  . CATARACT EXTRACTION W/ INTRAOCULAR LENS IMPLANT     "? side" (12/23/2017)  . CORONARY ANGIOPLASTY WITH STENT PLACEMENT  12/23/2017  . CORONARY STENT INTERVENTION N/A 12/23/2017   Procedure: CORONARY STENT INTERVENTION;  Surgeon: Martinique, [name], MD;  Location: [place] [facility];  Service: Cardiovascular;  Laterality: N/A;  . LEFT HEART CATH AND CORONARY ANGIOGRAPHY N/A 12/23/2017   Procedure: LEFT HEART CATH AND CORONARY ANGIOGRAPHY;  Surgeon: Martinique, [name], MD;  Location: [place] [facility];  Service: Cardiovascular;  Laterality: N/A;  . LUMBAR [place]     "ruptured disc"     Current Outpatient Medications  Medication Sig Dispense Refill  . amLODipine (NORVASC) 10 MG tablet Take 1 tablet (10 mg total) by mouth daily. 90 tablet 3  . aspirin EC 81 MG tablet Take 1 tablet (81 mg total) by mouth daily. 90 tablet 3  .  bisoprolol (ZEBETA) 5 MG tablet Take 5 mg by mouth at bedtime.    . clonazePAM (KLONOPIN) 0.5 MG tablet Take 0.25-0.5 mg by mouth daily as needed for anxiety.    . Coenzyme Q10 (CO Q 10) 100 MG CAPS Take 100 mg by mouth daily.     . ergocalciferol (VITAMIN D2) 50000 UNITS capsule Take 50,000 Units by mouth once a week.     . ezetimibe (ZETIA) 10 MG tablet Take 1 tablet (10 mg total) by mouth daily. 30 tablet 2  . FLUoxetine (PROZAC) 40 MG capsule Take 40 mg by mouth daily.    . hydroxychloroquine (PLAQUENIL) 200 MG tablet Take 200 mg by mouth daily.     [name] losartan (COZAAR) 50 MG tablet Take 50 mg by mouth daily.    .  Magnesium 500 MG TABS Take 500 mg by mouth daily.    . Multiple Vitamins-Minerals (CENTRUM ADULTS PO) Take 1 tablet by mouth daily.    . nitroGLYCERIN (NITROSTAT) 0.4 MG SL tablet Place 1 tablet (0.4 mg total) under the tongue every 5 (five) minutes as needed for chest pain. 30 tablet 2  . predniSONE (DELTASONE) 5 MG tablet Take 5 mg by mouth daily with breakfast.    . ticagrelor (BRILINTA) 90 MG TABS tablet Take 1 tablet (90 mg total) by mouth 2 (two) times daily. 60 tablet 11   No current facility-administered medications for this visit.     Allergies:   Lyrica [pregabalin]; Penicillins; and Sulfa antibiotics    Social History:  The patient  reports that she has been smoking. She has been smoking about 0.50 packs per day. She has never used smokeless tobacco. She reports current alcohol use. She reports that she does not use drugs.   Family History:  The patient's family history includes Diabetes in her mother; Emphysema in her father; Heart disease in her mother; Heart failure in her father and mother; Hypertension in her mother.    ROS: All other systems are reviewed and negative. Unless otherwise mentioned in H&P    PHYSICAL EXAM: VS:  There were no vitals taken for this visit. , BMI There is no height or weight on file to calculate BMI. GEN: Well nourished, well developed, in no acute distress HEENT: normal Neck: no JVD, carotid bruits, or masses Cardiac: RRR; no murmurs, rubs, or gallops,no edema  Respiratory:  Clear to auscultation bilaterally, normal work of breathing GI: soft, nontender, nondistended, + BS MS: no deformity or atrophy Skin: warm and dry, no rash, multiple bruises on her arm and torso.  Neuro:  Strength and sensation are intact Psych: euthymic mood, full affect   EKG:  Not completed on this office visit.    12/24/2017: BUN 24; Creatinine, Ser 1.57; Hemoglobin 12.5; Platelets PLATELET CLUMPS NOTED ON SMEAR, UNABLE TO ESTIMATE; Potassium 3.9; Sodium 140     Lipid Panel    Component Value Date/Time   CHOL 224 (H) 05/30/2016 1256   TRIG 167 (H) 05/30/2016 1256   HDL 54 05/30/2016 1256   CHOLHDL 4.1 05/30/2016 1256   CHOLHDL 3.7 04/06/2015 1544   VLDL 33 (H) 04/06/2015 1544   LDLCALC 137 (H) 05/30/2016 1256      Wt Readings from Last 3 Encounters:  12/24/17 194 lb 0.1 oz (88 kg)  12/18/17 197 lb (89.4 kg)  04/05/17 194 lb 12.8 oz (88.4 kg)      Other studies Reviewed: Left Heart Catheterization 12/23/2017:  Prox LAD to Mid LAD lesion is 95% stenosed.  Post intervention, there is a 0% residual stenosis.  A drug-eluting stent was successfully placed using a STENT SYNERGY DES 3X24.  The left ventricular systolic function is normal.  LV end diastolic pressure is normal.  The left ventricular ejection fraction is 55-65% by visual estimate.  1. Single vessel obstructive CAD involving the mid LAD 2. Normal LV function 3. Normal LVEDP 4. Successful PCI of the LAD with DES x 1.   Plan: DAPT for one year. Will watch overnight with IV hydration due to CKD. Anticipate DC in am.  ASSESSMENT AND PLAN:  1. CAD: S/P cardiac cath revealing 95% proximal LAD stenosis requiring a DES reducing to 0%. She is feeling much better and is now on DAPT. She is going to be referred to cardiac rehab. She is willing to try it for assistance with stamina and stress reduction.   She is supposed to have a nerve block injection by orthopedics. She is okay to proceed, but if she will required stopping Brilinta, she will have to wait a minimum of 6 months before discontinuing this to have procedure.   2. Hypertension: Currently controlled on amlodipine and losartan Follow this closely, with improvement in blood flow after PCI, she may have lower BP. Will see how she does at cardiac rehab and adjust BP medications accordingly.   3. Hypercholesterolemia: Continue Zetia. Follow up lipids and LFTs on next visit. May need PCSK9 therapy to keep LDL < 70  with CAD.   Current medicines are reviewed at length with the patient today.    Labs/ tests ordered today include: None  Phill Myron. [place] Pugh, ANP, AACC   01/06/2018 7:23 AM    [address] Office [phone] Fax [phone]

## 2018-01-06 NOTE — Patient Instructions (Signed)
Medication Instructions:  NO CHANGES- Your physician recommends that you continue on your current medications as directed. Please refer to the Current Medication list given to you today.  If you need a refill on your cardiac medications before your next appointment, please call your pharmacy.  Labwork: When/If you have your labs (blood work) drawn today and your tests are completely normal, you will receive your results only by [employer] (if you have MyChart) -OR-  A paper copy in the mail.  If you have any lab test that is abnormal or we need to change your treatment, we will call you to review these results.  Special Instructions: OK TO START CARDIAC REHAB.  Follow-Up: You will need a follow up appointment in 3 months.  You may see Kirk Ruths, MD Jory Sims, DNP, AACC or one of the following Advanced Practice Providers on your designated Care Team:   Kerin Ransom, PA-C  Roby Lofts, PA-C  Sande Rives, [place]    At [facility], you and your health needs are our priority.  As part of our continuing mission to provide you with exceptional heart care, we have created designated Provider Care Teams.  These Care Teams include your primary Cardiologist (physician) and Advanced Practice Providers (APPs -  Physician Assistants and Nurse Practitioners) who all work together to provide you with the care you need, when you need it.  Thank you for choosing [facility] at [facility]!!

## 2018-01-10 ENCOUNTER — Telehealth (HOSPITAL_COMMUNITY): Payer: Self-pay

## 2018-01-10 NOTE — Telephone Encounter (Signed)
Pt returned CR phone call and stated she is interested in participating in the Cardiac Rehab Program. Patient will come in for orientation on 02/13/18 @ 830AM and will attend the 945AM exercise class.  Mailed homework package.  went over insurance, patient verbalized understanding.

## 2018-01-10 NOTE — Telephone Encounter (Signed)
Attempted to call patient in regards to Cardiac Rehab - LM on VM 

## 2018-02-10 NOTE — Progress Notes (Signed)
Janet Johnston 68 y.o. female DOB [birth year] MRN [id number]       Nutrition Screen Note  No diagnosis found. Past Medical History:  Diagnosis Date  . Anxiety   . Asthmatic bronchitis   . Chronic bronchitis ([place])    "get it q yr; we head w/wood stove" (12/23/2017)  . Chronic kidney disease    "Creatinine level elevated last couple years" (12/23/2017)  . Chronic lower back pain   . COPD (chronic obstructive pulmonary disease) ([place])   . Coronary artery disease    a. LHC 12/23/2017: pLAD to mLAD 95% s/p DES  . Depression   . DJD (degenerative joint disease)   . Fibromyalgia   . GERD (gastroesophageal reflux disease)   . History of hiatal hernia   . History of kidney stones   . Hyperlipidemia   . Hypertension   . Pneumonia   . Prediabetes   . Rheumatoid arthritis ([place])   . Sleep apnea    "home test indicated that I had it; I've never used mask" (12/23/2017)  . Tension headache    "controlled" (12/23/2017)   Meds reviewed.    Current Outpatient Medications (Endocrine & Metabolic):  .  predniSONE (DELTASONE) 5 MG tablet, Take 5 mg by mouth daily with breakfast.  Current Outpatient Medications (Cardiovascular):  .  amLODipine (NORVASC) 10 MG tablet, Take 1 tablet (10 mg total) by mouth daily. .  bisoprolol (ZEBETA) 5 MG tablet, Take 5 mg by mouth at bedtime. [name]  ezetimibe (ZETIA) 10 MG tablet, Take 1 tablet (10 mg total) by mouth daily. [name]  losartan (COZAAR) 50 MG tablet, Take 50 mg by mouth daily. .  nitroGLYCERIN (NITROSTAT) 0.4 MG SL tablet, Place 1 tablet (0.4 mg total) under the tongue every 5 (five) minutes as needed for chest pain. (Patient not taking: Reported on 01/06/2018)   Current Outpatient Medications (Analgesics):  .  aspirin EC 81 MG tablet, Take 1 tablet (81 mg total) by mouth daily.  Current Outpatient Medications (Hematological):  .  ticagrelor (BRILINTA) 90 MG TABS tablet, Take 1 tablet (90 mg total) by mouth 2 (two) times daily.  Current Outpatient  Medications (Other):  .  clonazePAM (KLONOPIN) 0.5 MG tablet, Take 0.25-0.5 mg by mouth daily as needed for anxiety. .  Coenzyme Q10 (CO Q 10) 100 MG CAPS, Take 100 mg by mouth daily.  .  ergocalciferol (VITAMIN D2) 50000 UNITS capsule, Take 50,000 Units by mouth once a week.  [name]  FLUoxetine (PROZAC) 40 MG capsule, Take 40 mg by mouth daily. .  hydroxychloroquine (PLAQUENIL) 200 MG tablet, Take 200 mg by mouth daily.  .  Magnesium 500 MG TABS, Take 500 mg by mouth daily. .  Multiple Vitamins-Minerals (CENTRUM ADULTS PO), Take 1 tablet by mouth daily.   HT: Ht Readings from Last 1 Encounters:  01/06/18 5\' 4"  (1.626 m)    WT: Wt Readings from Last 5 Encounters:  01/06/18 193 lb 12.8 oz (87.9 kg)  12/24/17 194 lb 0.1 oz (88 kg)  12/18/17 197 lb (89.4 kg)  04/05/17 194 lb 12.8 oz (88.4 kg)  04/03/16 188 lb 9.6 oz (85.5 kg)     BMI = 33.25   12/27/17   Current tobacco use? yes per EMR       Labs:  Lipid Panel     Component Value Date/Time   CHOL 224 (H) 05/30/2016 1256   TRIG 167 (H) 05/30/2016 1256   HDL 54 05/30/2016 1256   CHOLHDL 4.1 05/30/2016 1256  CHOLHDL 3.7 04/06/2015 1544   VLDL 33 (H) 04/06/2015 1544   LDLCALC 137 (H) 05/30/2016 1256    No results found for: HGBA1C CBG (last 3)  No results for input(s): GLUCAP in the last 72 hours.  Nutrition Diagnosis ? Food-and nutrition-related knowledge deficit related to lack of exposure to information as related to diagnosis of: ? CVD ? Obesity related to excessive energy intake as evidenced by a  BMI = 33.25   12/27/17  Nutrition Goal(s):  ? To be determined  Plan:  Pt to attend nutrition classes ? Nutrition I ? Nutrition II ? Portion Distortion  Will provide client-centered nutrition education as part of interdisciplinary care.   Monitor and evaluate progress toward nutrition goal with team.  Laurina Bustle, MS, RD, LDN 02/10/2018 1:31 PM

## 2018-02-11 ENCOUNTER — Telehealth: Payer: Self-pay | Admitting: Adult Health

## 2018-02-11 DIAGNOSIS — R5383 Other fatigue: Secondary | ICD-10-CM | POA: Diagnosis not present

## 2018-02-11 DIAGNOSIS — I2510 Atherosclerotic heart disease of native coronary artery without angina pectoris: Secondary | ICD-10-CM | POA: Diagnosis not present

## 2018-02-11 DIAGNOSIS — Z955 Presence of coronary angioplasty implant and graft: Secondary | ICD-10-CM | POA: Diagnosis not present

## 2018-02-11 DIAGNOSIS — R0602 Shortness of breath: Secondary | ICD-10-CM | POA: Diagnosis not present

## 2018-02-11 NOTE — Telephone Encounter (Signed)
LVM to call office back to R/S appt w/ Yvetta Coder. Change in providers schedule.

## 2018-02-12 ENCOUNTER — Telehealth (HOSPITAL_COMMUNITY): Payer: Self-pay | Admitting: Cardiac Rehabilitation

## 2018-02-12 ENCOUNTER — Telehealth (HOSPITAL_COMMUNITY): Payer: Self-pay | Admitting: Pharmacist

## 2018-02-12 DIAGNOSIS — R0602 Shortness of breath: Secondary | ICD-10-CM | POA: Diagnosis not present

## 2018-02-12 NOTE — Telephone Encounter (Signed)
pc to pt for RN interview pre assessment for cardiac rehab orientation. Pt states she was evaluated by PCP for fatigue and malaise, chills with body aches with elevated WBC per patient.   Pt was prescribed doxycycline which she has been taking. Pt has f/u appt on 02/14/2018 for re evaluation of CBC and symptoms. PC to [facility] to request clearance for exercise and office notes to be faxed to [place].  LM for nurse to call back.  Pt has been put on hold for cardiac rehab until clearance received from PCP for group exercise setting. Pt agreeable to plan.  Pt would like to be rescheduled once cleared medically.  Understanding verbalized. Andi Hence, RN, BSN Cardiac Pulmonary Rehab

## 2018-02-12 NOTE — Telephone Encounter (Signed)
Cardiac Rehab Medication Review by a Pharmacist  Does the patient  feel that his/her medications are working for him/her?  Yes  Has the patient been experiencing any side effects to the medications prescribed?  Yes, she just stopped taking her ezetimibe yesterday due to consistent diarrhea since end of December. She reports her symptoms have improved since stopping. I encouraged her to contact her cardiologist regarding this issue.  Does the patient measure his/her own blood pressure or blood glucose at home?  Yes, she just started taking her BPs and was 104/63 yesterday. Amlodipine dose was reduced from 10mg  to 5mg  at office visit yesterday.  Does the patient have any problems obtaining medications due to transportation or finances?   No  Understanding of regimen: good Understanding of indications: good Potential of compliance: good  Pharmacist comments: A number of changes were recently made to this patients medication regimen:  1. She stopped taking her zetia due to diarrhea. I encouraged her to contact her cardiologist regarding this. She also states she has had intolerance to statins in the past. Will alert cardiac rehab team.  2. She has started omeprazole for acid reflux symptoms.  3. She was started on a 7-day course of doxycycline yesterday for "feeling-bad" and had an elevated WBC.  She is not sure what infection this is treating.  4. Her amlodipine dose has been reduced to 5mg  due to hypotension.  Janae Bridgeman, PharmD PGY1 Pharmacy Resident Phone: [phone] 02/12/2018 2:23 PM

## 2018-02-13 ENCOUNTER — Inpatient Hospital Stay (HOSPITAL_COMMUNITY)
Admission: RE | Admit: 2018-02-13 | Discharge: 2018-02-13 | Disposition: A | Discharge status: Home / Self Care | Payer: Self-pay | Source: Ambulatory Visit

## 2018-02-17 NOTE — Telephone Encounter (Signed)
Patient called back.  appt r/s to 3/9 with K. Purcell Nails.

## 2018-02-18 ENCOUNTER — Other Ambulatory Visit: Payer: Self-pay | Admitting: Family Medicine

## 2018-02-18 ENCOUNTER — Telehealth (HOSPITAL_COMMUNITY): Payer: Self-pay | Admitting: Cardiac Rehabilitation

## 2018-02-18 DIAGNOSIS — Z720 Tobacco use: Secondary | ICD-10-CM

## 2018-02-18 NOTE — Telephone Encounter (Signed)
pc received from [place] at [facility] reporting pt did not attend appointment in their office for repeat lab work. Nurse practioner advises cardiac rehab clearance be requested from cardiology.  Janet Johnston will fax most recent office notes and lab work.  Andi Hence, RN, BSN Cardiac Pulmonary Rehab

## 2018-02-19 ENCOUNTER — Ambulatory Visit (HOSPITAL_COMMUNITY): Payer: Self-pay

## 2018-02-20 ENCOUNTER — Telehealth (HOSPITAL_COMMUNITY): Payer: Self-pay

## 2018-02-20 NOTE — Telephone Encounter (Signed)
Called patient to see if she was interested in participating in the Cardiac Rehab Program. Patient stated yes. Patient will come in for orientation on 03/18/2018 @ 8:30am and will attend the 1:15pm exercise class.  Pt already has homework packet will mail out top sheet with updated appointment information  Tedra Senegal. Support Rep II

## 2018-02-21 ENCOUNTER — Ambulatory Visit (HOSPITAL_COMMUNITY): Payer: Self-pay

## 2018-02-24 ENCOUNTER — Ambulatory Visit
Admission: RE | Admit: 2018-02-24 | Discharge: 2018-02-24 | Disposition: A | Discharge status: Home / Self Care | Payer: Medicare Other | Source: Ambulatory Visit | Attending: Family Medicine | Admitting: Family Medicine

## 2018-02-24 ENCOUNTER — Ambulatory Visit (HOSPITAL_COMMUNITY): Payer: Self-pay

## 2018-02-24 DIAGNOSIS — Z720 Tobacco use: Secondary | ICD-10-CM

## 2018-02-24 DIAGNOSIS — Z87891 Personal history of nicotine dependence: Secondary | ICD-10-CM | POA: Diagnosis not present

## 2018-02-26 ENCOUNTER — Ambulatory Visit (HOSPITAL_COMMUNITY): Payer: Self-pay

## 2018-02-27 DIAGNOSIS — N2889 Other specified disorders of kidney and ureter: Secondary | ICD-10-CM | POA: Diagnosis not present

## 2018-02-27 DIAGNOSIS — N261 Atrophy of kidney (terminal): Secondary | ICD-10-CM | POA: Diagnosis not present

## 2018-02-28 ENCOUNTER — Ambulatory Visit (HOSPITAL_COMMUNITY): Payer: Self-pay

## 2018-03-03 ENCOUNTER — Ambulatory Visit (HOSPITAL_COMMUNITY): Payer: Self-pay

## 2018-03-05 ENCOUNTER — Ambulatory Visit (HOSPITAL_COMMUNITY): Payer: Self-pay

## 2018-03-06 ENCOUNTER — Telehealth (HOSPITAL_COMMUNITY): Payer: Self-pay | Admitting: Pharmacist

## 2018-03-07 ENCOUNTER — Ambulatory Visit (HOSPITAL_COMMUNITY): Payer: Self-pay

## 2018-03-10 ENCOUNTER — Ambulatory Visit (HOSPITAL_COMMUNITY): Payer: Self-pay

## 2018-03-12 ENCOUNTER — Ambulatory Visit (HOSPITAL_COMMUNITY): Payer: Self-pay

## 2018-03-13 ENCOUNTER — Ambulatory Visit: Payer: Self-pay | Admitting: Adult Health

## 2018-03-14 ENCOUNTER — Ambulatory Visit (HOSPITAL_COMMUNITY): Payer: Self-pay

## 2018-03-14 NOTE — Progress Notes (Signed)
Cardiology Office Note   Date:  03/17/2018   ID:  Janet, Johnston 04/22/1950, MRN [id number]  PCP:  [facility] [place], [place]  Cardiologist:  Stanford Breed Chief Complaint  Patient presents with  . Coronary Artery Disease  . Follow-up     History of Present Illness: Janet Johnston is a 68 y.o. female who presents for ongoing assessment and management of CAD, eclasia of abdominal aorta, HTN, HL, and recent tobacco assessment. She had recent cardiac cath by Dr. Martinique revealing proximal LAD stenosis, requiring PCI with DES, normal LVEF 55%-65%, placed on DAPT with ASA and Brilinta. She was last seen in 01/06/2018 and doing well.Janet Johnston She was willing to participate in cardiac rehab.   She offers no cardiac complaints. She has pain from RA and fibromyalgia which causes her to be sedentary. Some DOE, but likely related to deconditioning. She is going to start cardiac rehab tomorrow, She is medically compliant. She continues to be under a lot of stress with step son who lives with her.   Past Medical History:  Diagnosis Date  . Anxiety   . Asthmatic bronchitis   . Chronic bronchitis ([place])    "get it q yr; we head w/wood stove" (12/23/2017)  . Chronic kidney disease    "Creatinine level elevated last couple years" (12/23/2017)  . Chronic lower back pain   . COPD (chronic obstructive pulmonary disease) ([place])   . Coronary artery disease    a. LHC 12/23/2017: pLAD to mLAD 95% s/p DES  . Depression   . DJD (degenerative joint disease)   . Fibromyalgia   . GERD (gastroesophageal reflux disease)   . History of hiatal hernia   . History of kidney stones   . Hyperlipidemia   . Hypertension   . Pneumonia   . Prediabetes   . Rheumatoid arthritis ([place])   . Sleep apnea    "home test indicated that I had it; I've never used mask" (12/23/2017)  . Tension headache    "controlled" (12/23/2017)    Past Surgical History:  Procedure Laterality Date  . BACK SURGERY    . CARDIAC CATHETERIZATION  N/A 04/08/2015   Procedure: Left Heart Cath and Coronary Angiography;  Surgeon: [name], MD;  Location: [place] [facility];  Service: Cardiovascular;  Laterality: N/A;  . CARDIAC CATHETERIZATION N/A 04/08/2015   Procedure: Intravascular Pressure Wire/FFR Study;  Surgeon: [name], MD;  Location: [place] [facility];  Service: Cardiovascular;  Laterality: N/A;  . CATARACT EXTRACTION W/ INTRAOCULAR LENS IMPLANT     "? side" (12/23/2017)  . CORONARY ANGIOPLASTY WITH STENT PLACEMENT  12/23/2017  . CORONARY STENT INTERVENTION N/A 12/23/2017   Procedure: CORONARY STENT INTERVENTION;  Surgeon: Martinique, [name], MD;  Location: [place] [facility];  Service: Cardiovascular;  Laterality: N/A;  . LEFT HEART CATH AND CORONARY ANGIOGRAPHY N/A 12/23/2017   Procedure: LEFT HEART CATH AND CORONARY ANGIOGRAPHY;  Surgeon: Martinique, [name], MD;  Location: [place] [facility];  Service: Cardiovascular;  Laterality: N/A;  . LUMBAR [place]     "ruptured disc"     Current Outpatient Medications  Medication Sig Dispense Refill  . amLODipine (NORVASC) 5 MG tablet Take 5 mg by mouth daily.    Janet Johnston aspirin EC 81 MG tablet Take 1 tablet (81 mg total) by mouth daily. 90 tablet 3  . bisoprolol (ZEBETA) 5 MG tablet Take 5 mg by mouth at bedtime.    . clonazePAM (KLONOPIN) 0.5 MG tablet Take 0.25-0.5  mg by mouth daily as needed for anxiety.    . Coenzyme Q10 (CO Q 10) 100 MG CAPS Take 100 mg by mouth daily.     . ergocalciferol (VITAMIN D2) 50000 UNITS capsule Take 50,000 Units by mouth once a week.     Janet Johnston FLUoxetine (PROZAC) 40 MG capsule Take 40 mg by mouth daily.    . hydroxychloroquine (PLAQUENIL) 200 MG tablet Take 200 mg by mouth daily.     Janet Johnston losartan (COZAAR) 50 MG tablet Take 50 mg by mouth daily.    . Magnesium 500 MG TABS Take 500 mg by mouth daily.    . Multiple Vitamins-Minerals (CENTRUM ADULTS PO) Take 1 tablet by mouth daily.    . nitroGLYCERIN (NITROSTAT) 0.4 MG SL tablet Place 1 tablet (0.4 mg  total) under the tongue every 5 (five) minutes as needed for chest pain. 30 tablet 2  . omeprazole (PRILOSEC) 40 MG capsule Take 40 mg by mouth daily.    . predniSONE (DELTASONE) 5 MG tablet Take 5 mg by mouth daily with breakfast.    . ticagrelor (BRILINTA) 90 MG TABS tablet Take 1 tablet (90 mg total) by mouth 2 (two) times daily. 60 tablet 11   No current facility-administered medications for this visit.     Allergies:   Zetia [ezetimibe]; Lyrica [pregabalin]; Penicillins; and Sulfa antibiotics    Social History:  The patient  reports that she has been smoking. She has been smoking about 0.50 packs per day. She has never used smokeless tobacco. She reports current alcohol use. She reports that she does not use drugs.   Family History:  The patient's family history includes Diabetes in her mother; Emphysema in her father; Heart disease in her mother; Heart failure in her father and mother; Hypertension in her mother.    ROS: All other systems are reviewed and negative. Unless otherwise mentioned in H&P    PHYSICAL EXAM: VS:  BP 120/72   Pulse 70   Ht 5' 3.5" (1.613 m)   Wt 204 lb (92.5 kg)   BMI 35.57 kg/m  , BMI Body mass index is 35.57 kg/m. GEN: Well nourished, well developed, in no acute distress. Obese. HEENT: normal Neck: no JVD, carotid bruits, or masses Cardiac: RRR; no murmurs, rubs, or gallops,no edema  Respiratory:  Clear to auscultation bilaterally, normal work of breathing GI: soft, nontender, nondistended, + BS MS: no deformity or atrophy Skin: warm and dry, no rash Neuro:  Strength and sensation are intact Psych: euthymic mood, full affect   EKG:  NSR possible anterior infarct. Rate of 70   Recent Labs: 12/24/2017: BUN 24; Creatinine, Ser 1.57; Hemoglobin 12.5; Platelets PLATELET CLUMPS NOTED ON SMEAR, UNABLE TO ESTIMATE; Potassium 3.9; Sodium 140    Lipid Panel    Component Value Date/Time   CHOL 224 (H) 05/30/2016 1256   TRIG 167 (H) 05/30/2016 1256    HDL 54 05/30/2016 1256   CHOLHDL 4.1 05/30/2016 1256   CHOLHDL 3.7 04/06/2015 1544   VLDL 33 (H) 04/06/2015 1544   LDLCALC 137 (H) 05/30/2016 1256      Wt Readings from Last 3 Encounters:  03/17/18 204 lb (92.5 kg)  01/06/18 193 lb 12.8 oz (87.9 kg)  12/24/17 194 lb 0.1 oz (88 kg)      Other studies Reviewed: Left Heart Catheterization 12/23/2017:  Prox LAD to Mid LAD lesion is 95% stenosed.  Post intervention, there is a 0% residual stenosis.  A drug-eluting stent was successfully placed using a  STENT SYNERGY DES [id number].  The left ventricular systolic function is normal.  LV end diastolic pressure is normal.  The left ventricular ejection fraction is 55-65% by visual estimate.  1. Single vessel obstructive CAD involving the mid LAD 2. Normal LV function 3. Normal LVEDP 4. Successful PCI of the LAD with DES x 1.   Plan:DAPT for one year.   ASSESSMENT AND PLAN:  1. CAD: Hx of PCI and stent placement to the LAD. She remains on DAPT. Has some bruising on her hands and arms from her pets. She is medically compliant and is starting cardiac rehab tomorrow. She is encouraged to continue to be active after cardiac rehab for stamina and weight loss.   2. Hypertension: BP well controlled currently. No changes in her regimen.   3. Hypercholesterolemia; Has labs per PCP. If not done by next visit will need to have blood drawn. She should keep LDL < 70 per ACC guidelines for patients with CAD.    Current medicines are reviewed at length with the patient today.    Labs/ tests ordered today include: None  Phill Myron. [place] Pugh, ANP, AACC   03/17/2018 4:01 PM    [facility] [place] [address] Office [phone] Fax [phone]

## 2018-03-17 ENCOUNTER — Ambulatory Visit (HOSPITAL_COMMUNITY): Payer: Self-pay

## 2018-03-17 ENCOUNTER — Ambulatory Visit: Payer: Medicare Other | Admitting: Adult Health

## 2018-03-17 ENCOUNTER — Encounter: Payer: Self-pay | Admitting: Adult Health

## 2018-03-17 ENCOUNTER — Other Ambulatory Visit: Payer: Self-pay

## 2018-03-17 VITALS — BP 120/72 | HR 70 | Ht 63.5 in | Wt 204.0 lb

## 2018-03-17 DIAGNOSIS — E7800 Pure hypercholesterolemia, unspecified: Secondary | ICD-10-CM

## 2018-03-17 DIAGNOSIS — I2510 Atherosclerotic heart disease of native coronary artery without angina pectoris: Secondary | ICD-10-CM

## 2018-03-17 DIAGNOSIS — I10 Essential (primary) hypertension: Secondary | ICD-10-CM

## 2018-03-17 DIAGNOSIS — R0602 Shortness of breath: Secondary | ICD-10-CM | POA: Diagnosis not present

## 2018-03-17 NOTE — Patient Instructions (Signed)
Follow-Up: You will need a follow up appointment in 6 months.  Please call our office 2 months in advance, July 2020 to schedule this, September 2020 appointment.  You may see Kirk Ruths, MD or one of the following Advanced Practice Providers on your designated Care Team:  Kerin Ransom, [place] Roby Lofts, PA-C Sande Rives, [place]      Medication Instructions:  NO CHANGES- Your physician recommends that you continue on your current medications as directed. Please refer to the Current Medication list given to you today. If you need a refill on your cardiac medications before your next appointment, please call your pharmacy. Labwork: When you have labs (blood work) and your tests are completely normal, you will receive your results ONLY by [place] (if you have MyChart) -OR- A paper copy in the mail.  At [facility], you and your health needs are our priority.  As part of our continuing mission to provide you with exceptional heart care, we have created designated Provider Care Teams.  These Care Teams include your primary Cardiologist (physician) and Advanced Practice Providers (APPs -  Physician Assistants and Nurse Practitioners) who all work together to provide you with the care you need, when you need it.  Thank you for choosing [facility] at [facility]!!

## 2018-03-17 NOTE — Telephone Encounter (Signed)
Cardiac Rehab - Pharmacy Resident Documentation   Patient unable to be reached after three call attempts. Please complete allergy verification and medication review during patient's cardiac rehab appointment.    Thank you for allowing pharmacy to be a part of this patient's care.  Leron Croak, PharmD PGY1 Pharmacy Resident Phone: [phone]

## 2018-03-17 NOTE — Progress Notes (Addendum)
Janet Johnston 68 y.o. female DOB Dec 17, 1950 MRN [id number]       Nutrition Screen Note  No diagnosis found. Past Medical History:  Diagnosis Date  . Anxiety   . Asthmatic bronchitis   . Chronic bronchitis ([place])    "get it q yr; we head w/wood stove" (12/23/2017)  . Chronic kidney disease    "Creatinine level elevated last couple years" (12/23/2017)  . Chronic lower back pain   . COPD (chronic obstructive pulmonary disease) ([place])   . Coronary artery disease    a. LHC 12/23/2017: pLAD to mLAD 95% s/p DES  . Depression   . DJD (degenerative joint disease)   . Fibromyalgia   . GERD (gastroesophageal reflux disease)   . History of hiatal hernia   . History of kidney stones   . Hyperlipidemia   . Hypertension   . Pneumonia   . Prediabetes   . Rheumatoid arthritis ([place])   . Sleep apnea    "home test indicated that I had it; I've never used mask" (12/23/2017)  . Tension headache    "controlled" (12/23/2017)   Meds reviewed.    Current Outpatient Medications (Endocrine & Metabolic):  .  predniSONE (DELTASONE) 5 MG tablet, Take 5 mg by mouth daily with breakfast.  Current Outpatient Medications (Cardiovascular):  .  amLODipine (NORVASC) 5 MG tablet, Take 5 mg by mouth daily. .  bisoprolol (ZEBETA) 5 MG tablet, Take 5 mg by mouth at bedtime. [name]  losartan (COZAAR) 50 MG tablet, Take 50 mg by mouth daily. .  nitroGLYCERIN (NITROSTAT) 0.4 MG SL tablet, Place 1 tablet (0.4 mg total) under the tongue every 5 (five) minutes as needed for chest pain.   Current Outpatient Medications (Analgesics):  .  aspirin EC 81 MG tablet, Take 1 tablet (81 mg total) by mouth daily.  Current Outpatient Medications (Hematological):  .  ticagrelor (BRILINTA) 90 MG TABS tablet, Take 1 tablet (90 mg total) by mouth 2 (two) times daily.  Current Outpatient Medications (Other):  .  clonazePAM (KLONOPIN) 0.5 MG tablet, Take 0.25-0.5 mg by mouth daily as needed for anxiety. .  Coenzyme Q10 (CO Q 10)  100 MG CAPS, Take 100 mg by mouth daily.  .  ergocalciferol (VITAMIN D2) 50000 UNITS capsule, Take 50,000 Units by mouth once a week.  [name]  FLUoxetine (PROZAC) 40 MG capsule, Take 40 mg by mouth daily. .  hydroxychloroquine (PLAQUENIL) 200 MG tablet, Take 200 mg by mouth daily.  .  Magnesium 500 MG TABS, Take 500 mg by mouth daily. .  Multiple Vitamins-Minerals (CENTRUM ADULTS PO), Take 1 tablet by mouth daily. [name]  omeprazole (PRILOSEC) 40 MG capsule, Take 40 mg by mouth daily.   HT: Ht Readings from Last 1 Encounters:  03/17/18 5' 3.5" (1.613 m)    WT: Wt Readings from Last 5 Encounters:  03/17/18 204 lb (92.5 kg)  01/06/18 193 lb 12.8 oz (87.9 kg)  12/24/17 194 lb 0.1 oz (88 kg)  12/18/17 197 lb (89.4 kg)  04/05/17 194 lb 12.8 oz (88.4 kg)     BMI = 35.57  Current tobacco use? No       Labs:  Lipid Panel     Component Value Date/Time   CHOL 224 (H) 05/30/2016 1256   TRIG 167 (H) 05/30/2016 1256   HDL 54 05/30/2016 1256   CHOLHDL 4.1 05/30/2016 1256   CHOLHDL 3.7 04/06/2015 1544   VLDL 33 (H) 04/06/2015 1544   LDLCALC 137 (H) 05/30/2016 1256  No results found for: HGBA1C CBG (last 3)  No results for input(s): GLUCAP in the last 72 hours. Nutrition Note Spoke with pt via the phone to obtain Nutrition assessment and MEDFICTS survey. Nutrition plan and goals reviewed with pt. Pt is following Step 1 of the Therapeutic Lifestyle Changes diet. Pt did not express desired weight change at this time. Heart healthy prediabetic eating tips reviewed (label reading, how to build a healthy plate, portion sizes, eating frequently across the day). As pt has prediabetes, discussed the differences between complex and refined carbs, recommended pt replace refined carbs with complex. Reviewed the benefits swapping in complex carbs and moderating portion sizes can have on managing blood glucose with patient. Per discussion, pt does not use canned/convenience foods often. Pt does not add salt to  food. Pt does not eat out frequently. Pt expressed understanding of the information reviewed. Pt aware of nutrition education classes offered and would like to attend nutrition classes.  Nutrition Diagnosis ? Food-and nutrition-related knowledge deficit related to lack of exposure to information as related to diagnosis of: ? CVD ? Pre-diabetes   Nutrition Goal(s):  ? Pt to identify and limit food sources of saturated fat, trans fat, refined carbohydrates and sodium ? Pt to build a healthy plate including vegetables, fruits, whole grains, and low-fat dairy products in a heart healthy meal plan. ? Pt able to name foods that affect blood glucose.   Plan:  Pt to attend nutrition classes ? Nutrition I ? Nutrition II ? Portion Distortion  ? Diabetes Blitz ? Diabetes Q & A Will provide client-centered nutrition education as part of interdisciplinary care.   Monitor and evaluate progress toward nutrition goal with team.  Laurina Bustle, MS, RD, LDN 03/17/2018 9:11 PM

## 2018-03-18 ENCOUNTER — Inpatient Hospital Stay (HOSPITAL_COMMUNITY): Admission: RE | Admit: 2018-03-18 | Payer: Self-pay | Source: Ambulatory Visit

## 2018-03-18 ENCOUNTER — Encounter (HOSPITAL_COMMUNITY)
Admission: RE | Admit: 2018-03-18 | Discharge: 2018-03-18 | Disposition: A | Discharge status: Home / Self Care | Payer: Medicare Other | Source: Ambulatory Visit | Attending: Cardiology | Admitting: Cardiology

## 2018-03-18 VITALS — Ht 64.0 in | Wt 202.8 lb

## 2018-03-18 DIAGNOSIS — Z955 Presence of coronary angioplasty implant and graft: Secondary | ICD-10-CM | POA: Insufficient documentation

## 2018-03-18 NOTE — Progress Notes (Signed)
Cardiac Individual Treatment Plan  Patient Details  Name: Janet Johnston MRN: [id number] Date of Birth: August 05, 1950 Referring Provider:     CARDIAC REHAB PHASE II ORIENTATION from 03/18/2018 in [place]  Referring Provider  Dr. Stanford Breed      Initial Encounter Date:    CARDIAC REHAB PHASE II ORIENTATION from 03/18/2018 in [place]  Date  03/18/18      Visit Diagnosis: 12/23/17 DES pLAD  Patient's Home Medications on Admission:  Current Outpatient Medications:  .  amLODipine (NORVASC) 5 MG tablet, Take 5 mg by mouth daily., Disp: , Rfl:  .  aspirin EC 81 MG tablet, Take 1 tablet (81 mg total) by mouth daily., Disp: 90 tablet, Rfl: 3 .  bisoprolol (ZEBETA) 5 MG tablet, Take 5 mg by mouth at bedtime., Disp: , Rfl:  .  clonazePAM (KLONOPIN) 0.5 MG tablet, Take 0.25-0.5 mg by mouth daily as needed for anxiety., Disp: , Rfl:  .  Coenzyme Q10 (CO Q 10) 100 MG CAPS, Take 100 mg by mouth daily. , Disp: , Rfl:  .  ergocalciferol (VITAMIN D2) 50000 UNITS capsule, Take 50,000 Units by mouth once a week. , Disp: , Rfl:  .  FLUoxetine (PROZAC) 40 MG capsule, Take 40 mg by mouth daily., Disp: , Rfl:  .  hydroxychloroquine (PLAQUENIL) 200 MG tablet, Take 200 mg by mouth daily. , Disp: , Rfl:  .  losartan (COZAAR) 50 MG tablet, Take 50 mg by mouth daily., Disp: , Rfl:  .  Magnesium 500 MG TABS, Take 500 mg by mouth daily., Disp: , Rfl:  .  Multiple Vitamins-Minerals (CENTRUM ADULTS PO), Take 1 tablet by mouth daily., Disp: , Rfl:  .  nitroGLYCERIN (NITROSTAT) 0.4 MG SL tablet, Place 1 tablet (0.4 mg total) under the tongue every 5 (five) minutes as needed for chest pain., Disp: 30 tablet, Rfl: 2 .  omeprazole (PRILOSEC) 40 MG capsule, Take 40 mg by mouth daily. Takes prn, Disp: , Rfl:  .  predniSONE (DELTASONE) 5 MG tablet, Take 5 mg by mouth daily with breakfast., Disp: , Rfl:  .  ticagrelor (BRILINTA) 90 MG TABS tablet, Take 1  tablet (90 mg total) by mouth 2 (two) times daily., Disp: 60 tablet, Rfl: 11  Past Medical History: Past Medical History:  Diagnosis Date  . Anxiety   . Asthmatic bronchitis   . Chronic bronchitis ([place])    "get it q yr; we head w/wood stove" (12/23/2017)  . Chronic kidney disease    "Creatinine level elevated last couple years" (12/23/2017)  . Chronic lower back pain   . COPD (chronic obstructive pulmonary disease) ([place])   . Coronary artery disease    a. LHC 12/23/2017: pLAD to mLAD 95% s/p DES  . Depression   . DJD (degenerative joint disease)   . Fibromyalgia   . GERD (gastroesophageal reflux disease)   . History of hiatal hernia   . History of kidney stones   . Hyperlipidemia   . Hypertension   . Pneumonia   . Prediabetes   . Rheumatoid arthritis ([place])   . Sleep apnea    "home test indicated that I had it; I've never used mask" (12/23/2017)  . Tension headache    "controlled" (12/23/2017)    Tobacco Use: Social History   Tobacco Use  Smoking Status Current Every Day Smoker  . Packs/day: 0.50  Smokeless Tobacco Never Used    Labs: Recent Review Flowsheet Data  Labs for ITP Cardiac and Pulmonary Rehab Latest Ref Rng & Units 04/06/2015 05/30/2016 05/30/2016   Cholestrol 100 - 199 mg/dL 191 CANCELED 224(H)   LDLCALC 0 - 99 mg/dL 106 - 137(H)   HDL >39 mg/dL 52 CANCELED 54   Trlycerides 0 - 149 mg/dL 165(H) CANCELED 167(H)      Capillary Blood Glucose: No results found for: GLUCAP   Exercise Target Goals: Exercise Program Goal: Individual exercise prescription set using results from initial 6 min walk test and THRR while considering  patient's activity barriers and safety.   Exercise Prescription Goal: Initial exercise prescription builds to 30-45 minutes a day of aerobic activity, 2-3 days per week.  Home exercise guidelines will be given to patient during program as part of exercise prescription that the participant will acknowledge.  Activity Barriers &  Risk Stratification: Activity Barriers & Cardiac Risk Stratification - 03/18/18 1030      Activity Barriers & Cardiac Risk Stratification   Activity Barriers  Arthritis;Fibromyalgia;Muscular Weakness;Deconditioning   Right hip pain    Cardiac Risk Stratification  Moderate       6 Minute Walk: 6 Minute Walk    Row Name 03/18/18 1028         6 Minute Walk   Phase  Initial     Distance  1283 feet     Walk Time  6 minutes     # of Rest Breaks  1 Rest from 3:24-3:36     MPH  2.4     METS  2.4     RPE  11     Perceived Dyspnea   0     VO2 Peak  8.42     Symptoms  Yes (comment)     Comments  9/10 right hip pain     Resting HR  54 bpm     Resting BP  102/60     Resting Oxygen Saturation   97 %     Exercise Oxygen Saturation  during 6 min walk  97 %     Max Ex. HR  90 bpm     Max Ex. BP  118/62     2 Minute Post BP  104/60        Oxygen Initial Assessment:   Oxygen Re-Evaluation:   Oxygen Discharge (Final Oxygen Re-Evaluation):   Initial Exercise Prescription: Initial Exercise Prescription - 03/18/18 1000      Date of Initial Exercise RX and Referring Provider   Date  03/18/18    Referring Provider  Dr. Stanford Breed    Expected Discharge Date  06/23/18      NuStep   Level  2    SPM  75    Minutes  10    METs  2      Arm Ergometer   Level  1    Watts  25    Minutes  10    METs  2.46      Track   Laps  8    Minutes  10    METs  2.38      Intensity   THRR 40-80% of Max Heartrate  61-122    Ratings of Perceived Exertion  11-13      Progression   Progression  Continue to progress workloads to maintain intensity without signs/symptoms of physical distress.      Resistance Training   Training Prescription  Yes    Weight  2 lbs.     Reps  10-15  Perform Capillary Blood Glucose checks as needed.  Exercise Prescription Changes:   Exercise Comments:   Exercise Goals and Review: Exercise Goals    Row Name 03/18/18 1030              Exercise Goals   Increase Physical Activity  Yes       Intervention  Provide advice, education, support and counseling about physical activity/exercise needs.;Develop an individualized exercise prescription for aerobic and resistive training based on initial evaluation findings, risk stratification, comorbidities and participant's personal goals.       Expected Outcomes  Short Term: Attend rehab on a regular basis to increase amount of physical activity.       Increase Strength and Stamina  Yes       Intervention  Provide advice, education, support and counseling about physical activity/exercise needs.;Develop an individualized exercise prescription for aerobic and resistive training based on initial evaluation findings, risk stratification, comorbidities and participant's personal goals.       Expected Outcomes  Short Term: Increase workloads from initial exercise prescription for resistance, speed, and METs.       Able to understand and use rate of perceived exertion (RPE) scale  Yes       Intervention  Provide education and explanation on how to use RPE scale       Expected Outcomes  Short Term: Able to use RPE daily in rehab to express subjective intensity level;Long Term:  Able to use RPE to guide intensity level when exercising independently       Knowledge and understanding of Target Heart Rate Range (THRR)  Yes       Intervention  Provide education and explanation of THRR including how the numbers were predicted and where they are located for reference       Expected Outcomes  Short Term: Able to state/look up THRR;Long Term: Able to use THRR to govern intensity when exercising independently;Short Term: Able to use daily as guideline for intensity in rehab       Able to check pulse independently  Yes       Intervention  Provide education and demonstration on how to check pulse in carotid and radial arteries.;Review the importance of being able to check your own pulse for safety during  independent exercise       Expected Outcomes  Short Term: Able to explain why pulse checking is important during independent exercise;Long Term: Able to check pulse independently and accurately       Understanding of Exercise Prescription  Yes       Intervention  Provide education, explanation, and written materials on patient's individual exercise prescription       Expected Outcomes  Short Term: Able to explain program exercise prescription;Long Term: Able to explain home exercise prescription to exercise independently          Exercise Goals Re-Evaluation :   Discharge Exercise Prescription (Final Exercise Prescription Changes):   Nutrition:  Target Goals: Understanding of nutrition guidelines, daily intake of sodium 1500mg , cholesterol 200mg , calories 30% from fat and 7% or less from saturated fats, daily to have 5 or more servings of fruits and vegetables.  Biometrics: Pre Biometrics - 03/18/18 1031      Pre Biometrics   Height  5\' 4"  (1.626 m)    Weight  92 kg    Waist Circumference  42 inches    Hip Circumference  46.5 inches    Waist to Hip Ratio  0.9 %  BMI (Calculated)  34.8    Triceps Skinfold  47 mm    % Body Fat  48 %    Grip Strength  24 kg    Flexibility  12.5 in    Single Leg Stand  5.85 seconds        Nutrition Therapy Plan and Nutrition Goals:   Nutrition Assessments:   Nutrition Goals Re-Evaluation:   Nutrition Goals Re-Evaluation:   Nutrition Goals Discharge (Final Nutrition Goals Re-Evaluation):   Psychosocial: Target Goals: Acknowledge presence or absence of significant depression and/or stress, maximize coping skills, provide positive support system. Participant is able to verbalize types and ability to use techniques and skills needed for reducing stress and depression.  Initial Review & Psychosocial Screening: Initial Psych Review & Screening - 03/18/18 1318      Initial Review   Current issues with  Current Depression;Current  Anxiety/Panic;Current Stress Concerns    Source of Stress Concerns  Family;Unable to participate in former interests or hobbies      Family Dynamics   Concerns  Recent loss of significant other    Comments  Pt husband passed away 2 years ago      Barriers   Psychosocial barriers to participate in program  There are no identifiable barriers or psychosocial needs.      Screening Interventions   Interventions  Encouraged to exercise;Provide feedback about the scores to participant    Expected Outcomes  Long Term goal: The participant improves quality of Life and PHQ9 Scores as seen by post scores and/or verbalization of changes;Short Term goal: Identification and review with participant of any Quality of Life or Depression concerns found by scoring the questionnaire.       Quality of Life Scores: Quality of Life - 03/18/18 1031      Quality of Life   Select  Quality of Life      Quality of Life Scores   Health/Function Pre  24.36 %    Socioeconomic Pre  22.8 %    Psych/Spiritual Pre  24 %    Family Pre  23.25 %    GLOBAL Pre  23.87 %      Scores of 19 and below usually indicate a poorer quality of life in these areas.  A difference of  2-3 points is a clinically meaningful difference.  A difference of 2-3 points in the total score of the Quality of Life Index has been associated with significant improvement in overall quality of life, self-image, physical symptoms, and general health in studies assessing change in quality of life.  PHQ-9: Recent Review Flowsheet Data    There is no flowsheet data to display.     Interpretation of Total Score  Total Score Depression Severity:  1-4 = Minimal depression, 5-9 = Mild depression, 10-14 = Moderate depression, 15-19 = Moderately severe depression, 20-27 = Severe depression   Psychosocial Evaluation and Intervention:   Psychosocial Re-Evaluation:   Psychosocial Discharge (Final Psychosocial Re-Evaluation):   Vocational  Rehabilitation: Provide vocational rehab assistance to qualifying candidates.   Vocational Rehab Evaluation & Intervention: Vocational Rehab - 03/18/18 1320      Initial Vocational Rehab Evaluation & Intervention   Assessment shows need for Vocational Rehabilitation  No   pt is retired      Education: Education Goals: Education classes will be provided on a weekly basis, covering required topics. Participant will state understanding/return demonstration of topics presented.  Learning Barriers/Preferences: Learning Barriers/Preferences - 03/18/18 1032      Learning  Barriers/Preferences   Learning Barriers  Sight    Learning Preferences  Individual Instruction;Skilled Demonstration       Education Topics: Count Your Pulse:  -Group instruction provided by verbal instruction, demonstration, patient participation and written materials to support subject.  Instructors address importance of being able to find your pulse and how to count your pulse when at home without a heart monitor.  Patients get hands on experience counting their pulse with staff help and individually.   Heart Attack, Angina, and Risk Factor Modification:  -Group instruction provided by verbal instruction, video, and written materials to support subject.  Instructors address signs and symptoms of angina and heart attacks.    Also discuss risk factors for heart disease and how to make changes to improve heart health risk factors.   Functional Fitness:  -Group instruction provided by verbal instruction, demonstration, patient participation, and written materials to support subject.  Instructors address safety measures for doing things around the house.  Discuss how to get up and down off the floor, how to pick things up properly, how to safely get out of a chair without assistance, and balance training.   Meditation and Mindfulness:  -Group instruction provided by verbal instruction, patient participation, and written  materials to support subject.  Instructor addresses importance of mindfulness and meditation practice to help reduce stress and improve awareness.  Instructor also leads participants through a meditation exercise.    Stretching for Flexibility and Mobility:  -Group instruction provided by verbal instruction, patient participation, and written materials to support subject.  Instructors lead participants through series of stretches that are designed to increase flexibility thus improving mobility.  These stretches are additional exercise for major muscle groups that are typically performed during regular warm up and cool down.   Hands Only CPR:  -Group verbal, video, and participation provides a basic overview of AHA guidelines for community CPR. Role-play of emergencies allow participants the opportunity to practice calling for help and chest compression technique with discussion of AED use.   Hypertension: -Group verbal and written instruction that provides a basic overview of hypertension including the most recent diagnostic guidelines, risk factor reduction with self-care instructions and medication management.    Nutrition I class: Heart Healthy Eating:  -Group instruction provided by PowerPoint slides, verbal discussion, and written materials to support subject matter. The instructor gives an explanation and review of the Therapeutic Lifestyle Changes diet recommendations, which includes a discussion on lipid goals, dietary fat, sodium, fiber, plant stanol/sterol esters, sugar, and the components of a well-balanced, healthy diet.   Nutrition II class: Lifestyle Skills:  -Group instruction provided by PowerPoint slides, verbal discussion, and written materials to support subject matter. The instructor gives an explanation and review of label reading, grocery shopping for heart health, heart healthy recipe modifications, and ways to make healthier choices when eating out.   Diabetes  Question & Answer:  -Group instruction provided by PowerPoint slides, verbal discussion, and written materials to support subject matter. The instructor gives an explanation and review of diabetes co-morbidities, pre- and post-prandial blood glucose goals, pre-exercise blood glucose goals, signs, symptoms, and treatment of hypoglycemia and hyperglycemia, and foot care basics.   Diabetes Blitz:  -Group instruction provided by PowerPoint slides, verbal discussion, and written materials to support subject matter. The instructor gives an explanation and review of the physiology behind type 1 and type 2 diabetes, diabetes medications and rational behind using different medications, pre- and post-prandial blood glucose recommendations and Hemoglobin A1c goals, diabetes  diet, and exercise including blood glucose guidelines for exercising safely.    Portion Distortion:  -Group instruction provided by PowerPoint slides, verbal discussion, written materials, and food models to support subject matter. The instructor gives an explanation of serving size versus portion size, changes in portions sizes over the last 20 years, and what consists of a serving from each food group.   Stress Management:  -Group instruction provided by verbal instruction, video, and written materials to support subject matter.  Instructors review role of stress in heart disease and how to cope with stress positively.     Exercising on Your Own:  -Group instruction provided by verbal instruction, power point, and written materials to support subject.  Instructors discuss benefits of exercise, components of exercise, frequency and intensity of exercise, and end points for exercise.  Also discuss use of nitroglycerin and activating EMS.  Review options of places to exercise outside of rehab.  Review guidelines for sex with heart disease.   Cardiac Drugs I:  -Group instruction provided by verbal instruction and written materials to  support subject.  Instructor reviews cardiac drug classes: antiplatelets, anticoagulants, beta blockers, and statins.  Instructor discusses reasons, side effects, and lifestyle considerations for each drug class.   Cardiac Drugs II:  -Group instruction provided by verbal instruction and written materials to support subject.  Instructor reviews cardiac drug classes: angiotensin converting enzyme inhibitors (ACE-I), angiotensin II receptor blockers (ARBs), nitrates, and calcium channel blockers.  Instructor discusses reasons, side effects, and lifestyle considerations for each drug class.   Anatomy and Physiology of the Circulatory System:  Group verbal and written instruction and models provide basic cardiac anatomy and physiology, with the coronary electrical and arterial systems. Review of: AMI, Angina, Valve disease, Heart Failure, Peripheral Artery Disease, Cardiac Arrhythmia, Pacemakers, and the ICD.   Other Education:  -Group or individual verbal, written, or video instructions that support the educational goals of the cardiac rehab program.   Holiday Eating Survival Tips:  -Group instruction provided by PowerPoint slides, verbal discussion, and written materials to support subject matter. The instructor gives patients tips, tricks, and techniques to help them not only survive but enjoy the holidays despite the onslaught of food that accompanies the holidays.   Knowledge Questionnaire Score: Knowledge Questionnaire Score - 03/18/18 1032      Knowledge Questionnaire Score   Pre Score  19/24       Core Components/Risk Factors/Patient Goals at Admission: Personal Goals and Risk Factors at Admission - 03/18/18 1033      Core Components/Risk Factors/Patient Goals on Admission    Weight Management  Yes;Obesity;Weight Maintenance;Weight Loss    Intervention  Weight Management: Develop a combined nutrition and exercise program designed to reach desired caloric intake, while maintaining  appropriate intake of nutrient and fiber, sodium and fats, and appropriate energy expenditure required for the weight goal.;Weight Management: Provide education and appropriate resources to help participant work on and attain dietary goals.;Weight Management/Obesity: Establish reasonable short term and long term weight goals.;Obesity: Provide education and appropriate resources to help participant work on and attain dietary goals.    Admit Weight  202 lb 13.2 oz (92 kg)    Expected Outcomes  Short Term: Continue to assess and modify interventions until short term weight is achieved;Long Term: Adherence to nutrition and physical activity/exercise program aimed toward attainment of established weight goal;Weight Maintenance: Understanding of the daily nutrition guidelines, which includes 25-35% calories from fat, 7% or less cal from saturated fats, less than 200mg  cholesterol,  less than 1.5gm of sodium, & 5 or more servings of fruits and vegetables daily;Weight Loss: Understanding of general recommendations for a balanced deficit meal plan, which promotes 1-2 lb weight loss per week and includes a negative energy balance of [phone] kcal/d;Understanding recommendations for meals to include 15-35% energy as protein, 25-35% energy from fat, 35-60% energy from carbohydrates, less than 200mg  of dietary cholesterol, 20-35 gm of total fiber daily;Understanding of distribution of calorie intake throughout the day with the consumption of 4-5 meals/snacks    Tobacco Cessation  Yes    Intervention  Assist the participant in steps to quit. Provide individualized education and counseling about committing to Tobacco Cessation, relapse prevention, and pharmacological support that can be provided by physician.;Advice worker, assist with locating and accessing local/national Quit Smoking programs, and support quit date choice.    Expected Outcomes  Short Term: Will demonstrate readiness to quit, by selecting a  quit date.    Hypertension  Yes    Intervention  Provide education on lifestyle modifcations including regular physical activity/exercise, weight management, moderate sodium restriction and increased consumption of fresh fruit, vegetables, and low fat dairy, alcohol moderation, and smoking cessation.;Monitor prescription use compliance.    Expected Outcomes  Short Term: Continued assessment and intervention until BP is < 140/40mm HG in hypertensive participants. < 130/51mm HG in hypertensive participants with diabetes, heart failure or chronic kidney disease.;Long Term: Maintenance of blood pressure at goal levels.    Lipids  Yes    Intervention  Provide education and support for participant on nutrition & aerobic/resistive exercise along with prescribed medications to achieve LDL 70mg , HDL >40mg .    Expected Outcomes  Short Term: Participant states understanding of desired cholesterol values and is compliant with medications prescribed. Participant is following exercise prescription and nutrition guidelines.;Long Term: Cholesterol controlled with medications as prescribed, with individualized exercise RX and with personalized nutrition plan. Value goals: LDL < 70mg , HDL > 40 mg.    Stress  Yes    Intervention  Offer individual and/or small group education and counseling on adjustment to heart disease, stress management and health-related lifestyle change. Teach and support self-help strategies.;Refer participants experiencing significant psychosocial distress to appropriate mental health specialists for further evaluation and treatment. When possible, include family members and significant others in education/counseling sessions.    Expected Outcomes  Short Term: Participant demonstrates changes in health-related behavior, relaxation and other stress management skills, ability to obtain effective social support, and compliance with psychotropic medications if prescribed.;Long Term: Emotional wellbeing is  indicated by absence of clinically significant psychosocial distress or social isolation.       Core Components/Risk Factors/Patient Goals Review:    Core Components/Risk Factors/Patient Goals at Discharge (Final Review):    ITP Comments: ITP Comments    Row Name 03/18/18 0907           ITP Comments  Medical Director- Dr. Fransico Him, MD          Comments:  Patient attended orientation from [phone]  to 1000 to review rules and guidelines for program. Completed 6 minute walk test, Intitial ITP, and exercise prescription.  VSS. Telemetry-SR with no noted ectopy.  Pt asymptomatic and is looking forward to participating in Cardiac Rehab.

## 2018-03-18 NOTE — Progress Notes (Signed)
Cardiac Rehab Medication Review   Does the patient  feel that his/her medications are working for him/her?  yes  Has the patient been experiencing any side effects to the medications prescribed?  no  Does the patient measure his/her own blood pressure or blood glucose at home?  yes blood pressure kit, blood meter  Does the patient have any problems obtaining medications due to transportation or finances?   no  Understanding of regimen: excellent Understanding of indications: excellent Potential of compliance: excellent    Comments: medication reconciliation completed with Dekayla. No issues noted. Maurice Small RN, BSN Cardiac and Pulmonary Rehab Nurse Navigator       03/18/2018 9:33 AM

## 2018-03-19 ENCOUNTER — Ambulatory Visit (HOSPITAL_COMMUNITY): Payer: Self-pay

## 2018-03-21 ENCOUNTER — Ambulatory Visit (HOSPITAL_COMMUNITY): Payer: Self-pay

## 2018-03-24 ENCOUNTER — Telehealth (HOSPITAL_COMMUNITY): Payer: Self-pay | Admitting: Cardiac Rehabilitation

## 2018-03-24 ENCOUNTER — Ambulatory Visit (HOSPITAL_COMMUNITY): Payer: Self-pay

## 2018-03-24 ENCOUNTER — Ambulatory Visit: Payer: Self-pay | Admitting: Cardiology

## 2018-03-24 NOTE — Telephone Encounter (Signed)
Phone call to pt to inform of [facility] Phase II departmental closing.  Pt verbalized understanding.  Andi Hence, RN, BSN Cardiac Pulmonary Rehab

## 2018-03-26 ENCOUNTER — Ambulatory Visit (HOSPITAL_COMMUNITY): Payer: Self-pay

## 2018-03-26 ENCOUNTER — Telehealth (HOSPITAL_COMMUNITY): Payer: Self-pay

## 2018-03-26 NOTE — Telephone Encounter (Signed)
Phone call made to Pt to discuss home exercise guidelines. Pt did not answer. Message was left for Pt to return call to Cardiac Rehab to discuss home exercise guidelines.

## 2018-03-26 NOTE — Progress Notes (Signed)
I have reviewed a Home Exercise Prescription with Janet Johnston . [name] is currently exercising at home.  The patient was advised to walk 5-7 days a week for 30-45 minutes.  [name] and I discussed how to progress their exercise prescription.  The patient stated that their goals were to continue walking at home for exercise.  The patient stated that they understand the exercise prescription.  We reviewed exercise guidelines, target heart rate during exercise, RPE Scale, weather conditions, NTG use, endpoints for exercise, warmup and cool down.  Patient is encouraged to come to me with any questions. I will continue to follow up with the patient to assist them with progression and safety.    03/26/2018 2:07 PM  Deitra Mayo BS, ACSM CEP

## 2018-03-26 NOTE — Telephone Encounter (Signed)
Phone call to Pt to discuss home exercise guidelines due to closure of Cardiac Rehab for [place]. Pt was responsive and understands goals of home exercise. Will send home exercise documents to Pt via mail.

## 2018-03-28 ENCOUNTER — Ambulatory Visit (HOSPITAL_COMMUNITY): Payer: Self-pay

## 2018-03-31 ENCOUNTER — Ambulatory Visit (HOSPITAL_COMMUNITY): Payer: Self-pay

## 2018-04-01 ENCOUNTER — Telehealth (HOSPITAL_COMMUNITY): Payer: Self-pay | Admitting: Cardiac Rehabilitation

## 2018-04-01 ENCOUNTER — Encounter (HOSPITAL_COMMUNITY): Payer: Self-pay | Admitting: Cardiac Rehabilitation

## 2018-04-01 DIAGNOSIS — Z955 Presence of coronary angioplasty implant and graft: Secondary | ICD-10-CM

## 2018-04-01 DIAGNOSIS — I129 Hypertensive chronic kidney disease with stage 1 through stage 4 chronic kidney disease, or unspecified chronic kidney disease: Secondary | ICD-10-CM | POA: Diagnosis not present

## 2018-04-01 DIAGNOSIS — N184 Chronic kidney disease, stage 4 (severe): Secondary | ICD-10-CM | POA: Diagnosis not present

## 2018-04-01 DIAGNOSIS — N261 Atrophy of kidney (terminal): Secondary | ICD-10-CM | POA: Diagnosis not present

## 2018-04-01 NOTE — Telephone Encounter (Signed)
Phone call to pt to advise of outpatient cardiac rehab departmental closing additional 30 days for COVID 19 precautions.  Left message on answering machine.  Roberto Romanoski, RN, BSN Cardiac Pulmonary Rehab 

## 2018-04-01 NOTE — Progress Notes (Signed)
Cardiac Individual Treatment Plan  Patient Details  Name: Janet Johnston MRN: [id number] Date of Birth: Apr 26, 1950 Referring Provider:   Flowsheet Row CARDIAC REHAB PHASE II ORIENTATION from 03/18/2018 in [facility]  Referring Provider  Dr. Stanford Breed      Initial Encounter Date:  Flowsheet Row CARDIAC REHAB PHASE II ORIENTATION from 03/18/2018 in [facility]  Date  03/18/18      Visit Diagnosis: 12/23/17 DES pLAD  Patient's Home Medications on Admission:  Current Outpatient Medications:  .  amLODipine (NORVASC) 5 MG tablet, Take 5 mg by mouth daily., Disp: , Rfl:  .  aspirin EC 81 MG tablet, Take 1 tablet (81 mg total) by mouth daily., Disp: 90 tablet, Rfl: 3 .  bisoprolol (ZEBETA) 5 MG tablet, Take 5 mg by mouth at bedtime., Disp: , Rfl:  .  clonazePAM (KLONOPIN) 0.5 MG tablet, Take 0.25-0.5 mg by mouth daily as needed for anxiety., Disp: , Rfl:  .  Coenzyme Q10 (CO Q 10) 100 MG CAPS, Take 100 mg by mouth daily. , Disp: , Rfl:  .  ergocalciferol (VITAMIN D2) 50000 UNITS capsule, Take 50,000 Units by mouth once a week. , Disp: , Rfl:  .  FLUoxetine (PROZAC) 40 MG capsule, Take 40 mg by mouth daily., Disp: , Rfl:  .  hydroxychloroquine (PLAQUENIL) 200 MG tablet, Take 200 mg by mouth daily. , Disp: , Rfl:  .  losartan (COZAAR) 50 MG tablet, Take 50 mg by mouth daily., Disp: , Rfl:  .  Magnesium 500 MG TABS, Take 500 mg by mouth daily., Disp: , Rfl:  .  Multiple Vitamins-Minerals (CENTRUM ADULTS PO), Take 1 tablet by mouth daily., Disp: , Rfl:  .  nitroGLYCERIN (NITROSTAT) 0.4 MG SL tablet, Place 1 tablet (0.4 mg total) under the tongue every 5 (five) minutes as needed for chest pain., Disp: 30 tablet, Rfl: 2 .  omeprazole (PRILOSEC) 40 MG capsule, Take 40 mg by mouth daily. Takes prn, Disp: , Rfl:  .  predniSONE (DELTASONE) 5 MG tablet, Take 5 mg by mouth daily with breakfast., Disp: , Rfl:  .  ticagrelor (BRILINTA) 90 MG  TABS tablet, Take 1 tablet (90 mg total) by mouth 2 (two) times daily., Disp: 60 tablet, Rfl: 11  Past Medical History: Past Medical History:  Diagnosis Date  . Anxiety   . Asthmatic bronchitis   . Chronic bronchitis ([place])    "get it q yr; we head w/wood stove" (12/23/2017)  . Chronic kidney disease    "Creatinine level elevated last couple years" (12/23/2017)  . Chronic lower back pain   . COPD (chronic obstructive pulmonary disease) ([place])   . Coronary artery disease    a. LHC 12/23/2017: pLAD to mLAD 95% s/p DES  . Depression   . DJD (degenerative joint disease)   . Fibromyalgia   . GERD (gastroesophageal reflux disease)   . History of hiatal hernia   . History of kidney stones   . Hyperlipidemia   . Hypertension   . Pneumonia   . Prediabetes   . Rheumatoid arthritis ([place])   . Sleep apnea    "home test indicated that I had it; I've never used mask" (12/23/2017)  . Tension headache    "controlled" (12/23/2017)    Tobacco Use: Social History   Tobacco Use  Smoking Status Current Every Day Smoker  . Packs/day: 0.50  Smokeless Tobacco Never Used    Labs: Recent Review Flowsheet Data  Labs for ITP Cardiac and Pulmonary Rehab Latest Ref Rng & Units 04/06/2015 05/30/2016 05/30/2016   Cholestrol 100 - 199 mg/dL 191 CANCELED 224(H)   LDLCALC 0 - 99 mg/dL 106 - 137(H)   HDL >39 mg/dL 52 CANCELED 54   Trlycerides 0 - 149 mg/dL 165(H) CANCELED 167(H)      Capillary Blood Glucose: No results found for: GLUCAP   Exercise Target Goals: Exercise Program Goal: Individual exercise prescription set using results from initial 6 min walk test and THRR while considering  patient's activity barriers and safety.   Exercise Prescription Goal: Initial exercise prescription builds to 30-45 minutes a day of aerobic activity, 2-3 days per week.  Home exercise guidelines will be given to patient during program as part of exercise prescription that the participant will  acknowledge.  Activity Barriers & Risk Stratification: Activity Barriers & Cardiac Risk Stratification - 03/18/18 1030    Activity Barriers & Cardiac Risk Stratification          Activity Barriers  Arthritis;Fibromyalgia;Muscular Weakness;Deconditioning   Right hip pain    Cardiac Risk Stratification  Moderate           6 Minute Walk: 6 Minute Walk    6 Minute Walk    Row Name 03/18/18 1028   Phase  Initial   Distance  1283 feet   Walk Time  6 minutes   # of Rest Breaks  1 Rest from 3:24-3:36   MPH  2.4   METS  2.4   RPE  11   Perceived Dyspnea   0   VO2 Peak  8.42   Symptoms  Yes (comment)   Comments  9/10 right hip pain   Resting HR  54 bpm   Resting BP  102/60   Resting Oxygen Saturation   97 %   Exercise Oxygen Saturation  during 6 min walk  97 %   Max Ex. HR  90 bpm   Max Ex. BP  118/62   2 Minute Post BP  104/60          Oxygen Initial Assessment:   Oxygen Re-Evaluation:   Oxygen Discharge (Final Oxygen Re-Evaluation):   Initial Exercise Prescription: Initial Exercise Prescription - 03/18/18 1000    Date of Initial Exercise RX and Referring Provider          Date  03/18/18    Referring Provider  Dr. Stanford Breed    Expected Discharge Date  06/23/18        NuStep          Level  2    SPM  75    Minutes  10    METs  2        Arm Ergometer          Level  1    Watts  25    Minutes  10    METs  2.46        Track          Laps  8    Minutes  10    METs  2.38        Intensity          THRR 40-80% of Max Heartrate  61-122    Ratings of Perceived Exertion  11-13        Progression          Progression  Continue to progress workloads to maintain intensity without signs/symptoms of physical distress.  Resistance Training          Training Prescription  Yes    Weight  2 lbs.     Reps  10-15           Perform Capillary Blood Glucose checks as needed.  Exercise Prescription Changes: Exercise Prescription Changes     Home Exercise Plan    [place] Name 03/26/18 1400   Plans to continue exercise at  Home (comment)   Frequency  Add 4 additional days to program exercise sessions.   Initial Home Exercises Provided  03/26/18          Exercise Comments: Exercise Comments    Row Name 03/26/18 1409 04/01/18 1117   Exercise Comments  Reviewed HEP with Pt. Pt was responsive and understands goals.   pt currently on hold for COVID 19 precautions.       Exercise Goals and Review: Exercise Goals    Exercise Goals    Row Name 03/18/18 1030   Increase Physical Activity  Yes   Intervention  Provide advice, education, support and counseling about physical activity/exercise needs.;Develop an individualized exercise prescription for aerobic and resistive training based on initial evaluation findings, risk stratification, comorbidities and participant's personal goals.   Expected Outcomes  Short Term: Attend rehab on a regular basis to increase amount of physical activity.   Increase Strength and Stamina  Yes   Intervention  Provide advice, education, support and counseling about physical activity/exercise needs.;Develop an individualized exercise prescription for aerobic and resistive training based on initial evaluation findings, risk stratification, comorbidities and participant's personal goals.   Expected Outcomes  Short Term: Increase workloads from initial exercise prescription for resistance, speed, and METs.   Able to understand and use rate of perceived exertion (RPE) scale  Yes   Intervention  Provide education and explanation on how to use RPE scale   Expected Outcomes  Short Term: Able to use RPE daily in rehab to express subjective intensity level;Long Term:  Able to use RPE to guide intensity level when exercising independently   Knowledge and understanding of Target Heart Rate Range (THRR)  Yes   Intervention  Provide education and explanation of THRR including how the numbers were predicted and where they  are located for reference   Expected Outcomes  Short Term: Able to state/look up THRR;Long Term: Able to use THRR to govern intensity when exercising independently;Short Term: Able to use daily as guideline for intensity in rehab   Able to check pulse independently  Yes   Intervention  Provide education and demonstration on how to check pulse in carotid and radial arteries.;Review the importance of being able to check your own pulse for safety during independent exercise   Expected Outcomes  Short Term: Able to explain why pulse checking is important during independent exercise;Long Term: Able to check pulse independently and accurately   Understanding of Exercise Prescription  Yes   Intervention  Provide education, explanation, and written materials on patient's individual exercise prescription   Expected Outcomes  Short Term: Able to explain program exercise prescription;Long Term: Able to explain home exercise prescription to exercise independently          Exercise Goals Re-Evaluation : Exercise Goals Re-Evaluation    Exercise Goal Re-Evaluation    Row Name 03/26/18 1407   Exercise Goals Review  Increase Physical Activity;Increase Strength and Stamina;Able to understand and use rate of perceived exertion (RPE) scale;Knowledge and understanding of Target Heart Rate Range (THRR);Understanding of Exercise Prescription;Able to check  pulse independently   Comments  Reviewed HEP with Pt. Pt was responsive and understands THRR, RPE scale, end points of exercise, weather precautions, and NTG use. Pt is walking 5-7 days per week for 30-45 minutes a day.    Expected Outcomes  Will continue to monitor and progess Pt as tolerated.           Discharge Exercise Prescription (Final Exercise Prescription Changes): Exercise Prescription Changes - 03/26/18 1400    Home Exercise Plan          Plans to continue exercise at  Home (comment)    Frequency  Add 4 additional days to program exercise  sessions.    Initial Home Exercises Provided  03/26/18           Nutrition:  Target Goals: Understanding of nutrition guidelines, daily intake of sodium 1500mg , cholesterol 200mg , calories 30% from fat and 7% or less from saturated fats, daily to have 5 or more servings of fruits and vegetables.  Biometrics: Pre Biometrics - 03/18/18 1031    Pre Biometrics          Height  5\' 4"  (1.626 m)    Weight  202 lb 13.2 oz (92 kg)    Waist Circumference  42 inches    Hip Circumference  46.5 inches    Waist to Hip Ratio  0.9 %    BMI (Calculated)  34.8    Triceps Skinfold  47 mm    % Body Fat  48 %    Grip Strength  24 kg    Flexibility  12.5 in    Single Leg Stand  5.85 seconds            Nutrition Therapy Plan and Nutrition Goals: Nutrition Therapy & Goals - 03/24/18 1137    Nutrition Therapy          Diet  heart healthy        Personal Nutrition Goals          Nutrition Goal  Pt to identify and limit food sources of saturated fat, trans fat, refined carbohydrates and sodium    Personal Goal #2  Pt to build a healthy plate including vegetables, fruits, whole grains, and low-fat dairy products in a heart healthy meal plan.    Personal Goal #3  Pt able to name foods that affect blood glucose.        Intervention Plan          Intervention  Prescribe, educate and counsel regarding individualized specific dietary modifications aiming towards targeted core components such as weight, hypertension, lipid management, diabetes, heart failure and other comorbidities.    Expected Outcomes  Short Term Goal: Understand basic principles of dietary content, such as calories, fat, sodium, cholesterol and nutrients.;Long Term Goal: Adherence to prescribed nutrition plan.           Nutrition Assessments: Nutrition Assessments - 03/24/18 1137    MEDFICTS Scores          Pre Score  51           Nutrition Goals Re-Evaluation: Nutrition Goals Re-Evaluation    Goals    Row Name  03/24/18 1137   Current Weight  203 lb 14.8 oz (92.5 kg)          Nutrition Goals Re-Evaluation: Nutrition Goals Re-Evaluation    Goals    Row Name 03/24/18 1137   Current Weight  203 lb 14.8 oz (92.5 kg)  Nutrition Goals Discharge (Final Nutrition Goals Re-Evaluation): Nutrition Goals Re-Evaluation - 03/24/18 1137    Goals          Current Weight  203 lb 14.8 oz (92.5 kg)           Psychosocial: Target Goals: Acknowledge presence or absence of significant depression and/or stress, maximize coping skills, provide positive support system. Participant is able to verbalize types and ability to use techniques and skills needed for reducing stress and depression.  Initial Review & Psychosocial Screening: Initial Psych Review & Screening - 03/18/18 1323    Initial Review          Current issues with  Current Psychotropic Meds    Comments  Takes Klonopin prn anxiety and sleep. Pt is taking prozac for many years.  Pt feels they do work for her.           Quality of Life Scores: Quality of Life - 03/18/18 1031    Quality of Life          Select  Quality of Life        Quality of Life Scores          Health/Function Pre  24.36 %    Socioeconomic Pre  22.8 %    Psych/Spiritual Pre  24 %    Family Pre  23.25 %    GLOBAL Pre  23.87 %          Scores of 19 and below usually indicate a poorer quality of life in these areas.  A difference of  2-3 points is a clinically meaningful difference.  A difference of 2-3 points in the total score of the Quality of Life Index has been associated with significant improvement in overall quality of life, self-image, physical symptoms, and general health in studies assessing change in quality of life.  PHQ-9: Recent Review Flowsheet Data    There is no flowsheet data to display.     Interpretation of Total Score  Total Score Depression Severity:  1-4 = Minimal depression, 5-9 = Mild depression, 10-14 = Moderate  depression, 15-19 = Moderately severe depression, 20-27 = Severe depression   Psychosocial Evaluation and Intervention:   Psychosocial Re-Evaluation:   Psychosocial Discharge (Final Psychosocial Re-Evaluation):   Vocational Rehabilitation: Provide vocational rehab assistance to qualifying candidates.   Vocational Rehab Evaluation & Intervention: Vocational Rehab - 03/18/18 1320    Initial Vocational Rehab Evaluation & Intervention          Assessment shows need for Vocational Rehabilitation  No   pt is retired          Education: Education Goals: Education classes will be provided on a weekly basis, covering required topics. Participant will state understanding/return demonstration of topics presented.  Learning Barriers/Preferences: Learning Barriers/Preferences - 03/18/18 1032    Learning Barriers/Preferences          Learning Barriers  Sight    Learning Preferences  Individual Instruction;Skilled Demonstration           Education Topics: Count Your Pulse:  -Group instruction provided by verbal instruction, demonstration, patient participation and written materials to support subject.  Instructors address importance of being able to find your pulse and how to count your pulse when at home without a heart monitor.  Patients get hands on experience counting their pulse with staff help and individually.   Heart Attack, Angina, and Risk Factor Modification:  -Group instruction provided by verbal instruction, video, and written materials to support subject.  Instructors address signs and symptoms of angina and heart attacks.    Also discuss risk factors for heart disease and how to make changes to improve heart health risk factors.   Functional Fitness:  -Group instruction provided by verbal instruction, demonstration, patient participation, and written materials to support subject.  Instructors address safety measures for doing things around the house.  Discuss how to  get up and down off the floor, how to pick things up properly, how to safely get out of a chair without assistance, and balance training.   Meditation and Mindfulness:  -Group instruction provided by verbal instruction, patient participation, and written materials to support subject.  Instructor addresses importance of mindfulness and meditation practice to help reduce stress and improve awareness.  Instructor also leads participants through a meditation exercise.    Stretching for Flexibility and Mobility:  -Group instruction provided by verbal instruction, patient participation, and written materials to support subject.  Instructors lead participants through series of stretches that are designed to increase flexibility thus improving mobility.  These stretches are additional exercise for major muscle groups that are typically performed during regular warm up and cool down.   Hands Only CPR:  -Group verbal, video, and participation provides a basic overview of AHA guidelines for community CPR. Role-play of emergencies allow participants the opportunity to practice calling for help and chest compression technique with discussion of AED use.   Hypertension: -Group verbal and written instruction that provides a basic overview of hypertension including the most recent diagnostic guidelines, risk factor reduction with self-care instructions and medication management.    Nutrition I class: Heart Healthy Eating:  -Group instruction provided by PowerPoint slides, verbal discussion, and written materials to support subject matter. The instructor gives an explanation and review of the Therapeutic Lifestyle Changes diet recommendations, which includes a discussion on lipid goals, dietary fat, sodium, fiber, plant stanol/sterol esters, sugar, and the components of a well-balanced, healthy diet.   Nutrition II class: Lifestyle Skills:  -Group instruction provided by PowerPoint slides, verbal discussion,  and written materials to support subject matter. The instructor gives an explanation and review of label reading, grocery shopping for heart health, heart healthy recipe modifications, and ways to make healthier choices when eating out.   Diabetes Question & Answer:  -Group instruction provided by PowerPoint slides, verbal discussion, and written materials to support subject matter. The instructor gives an explanation and review of diabetes co-morbidities, pre- and post-prandial blood glucose goals, pre-exercise blood glucose goals, signs, symptoms, and treatment of hypoglycemia and hyperglycemia, and foot care basics.   Diabetes Blitz:  -Group instruction provided by PowerPoint slides, verbal discussion, and written materials to support subject matter. The instructor gives an explanation and review of the physiology behind type 1 and type 2 diabetes, diabetes medications and rational behind using different medications, pre- and post-prandial blood glucose recommendations and Hemoglobin A1c goals, diabetes diet, and exercise including blood glucose guidelines for exercising safely.    Portion Distortion:  -Group instruction provided by PowerPoint slides, verbal discussion, written materials, and food models to support subject matter. The instructor gives an explanation of serving size versus portion size, changes in portions sizes over the last 20 years, and what consists of a serving from each food group.   Stress Management:  -Group instruction provided by verbal instruction, video, and written materials to support subject matter.  Instructors review role of stress in heart disease and how to cope with stress positively.     Exercising on Your  Own:  -Group instruction provided by verbal instruction, power point, and written materials to support subject.  Instructors discuss benefits of exercise, components of exercise, frequency and intensity of exercise, and end points for exercise.  Also  discuss use of nitroglycerin and activating EMS.  Review options of places to exercise outside of rehab.  Review guidelines for sex with heart disease.   Cardiac Drugs I:  -Group instruction provided by verbal instruction and written materials to support subject.  Instructor reviews cardiac drug classes: antiplatelets, anticoagulants, beta blockers, and statins.  Instructor discusses reasons, side effects, and lifestyle considerations for each drug class.   Cardiac Drugs II:  -Group instruction provided by verbal instruction and written materials to support subject.  Instructor reviews cardiac drug classes: angiotensin converting enzyme inhibitors (ACE-I), angiotensin II receptor blockers (ARBs), nitrates, and calcium channel blockers.  Instructor discusses reasons, side effects, and lifestyle considerations for each drug class.   Anatomy and Physiology of the Circulatory System:  Group verbal and written instruction and models provide basic cardiac anatomy and physiology, with the coronary electrical and arterial systems. Review of: AMI, Angina, Valve disease, Heart Failure, Peripheral Artery Disease, Cardiac Arrhythmia, Pacemakers, and the ICD.   Other Education:  -Group or individual verbal, written, or video instructions that support the educational goals of the cardiac rehab program.   Holiday Eating Survival Tips:  -Group instruction provided by PowerPoint slides, verbal discussion, and written materials to support subject matter. The instructor gives patients tips, tricks, and techniques to help them not only survive but enjoy the holidays despite the onslaught of food that accompanies the holidays.   Knowledge Questionnaire Score: Knowledge Questionnaire Score - 03/18/18 1032    Knowledge Questionnaire Score          Pre Score  19/24           Core Components/Risk Factors/Patient Goals at Admission: Personal Goals and Risk Factors at Admission - 03/18/18 1033    Core  Components/Risk Factors/Patient Goals on Admission           Weight Management  Yes;Obesity;Weight Maintenance;Weight Loss    Intervention  Weight Management: Develop a combined nutrition and exercise program designed to reach desired caloric intake, while maintaining appropriate intake of nutrient and fiber, sodium and fats, and appropriate energy expenditure required for the weight goal.;Weight Management: Provide education and appropriate resources to help participant work on and attain dietary goals.;Weight Management/Obesity: Establish reasonable short term and long term weight goals.;Obesity: Provide education and appropriate resources to help participant work on and attain dietary goals.    Admit Weight  202 lb 13.2 oz (92 kg)    Expected Outcomes  Short Term: Continue to assess and modify interventions until short term weight is achieved;Long Term: Adherence to nutrition and physical activity/exercise program aimed toward attainment of established weight goal;Weight Maintenance: Understanding of the daily nutrition guidelines, which includes 25-35% calories from fat, 7% or less cal from saturated fats, less than 200mg  cholesterol, less than 1.5gm of sodium, & 5 or more servings of fruits and vegetables daily;Weight Loss: Understanding of general recommendations for a balanced deficit meal plan, which promotes 1-2 lb weight loss per week and includes a negative energy balance of [phone] kcal/d;Understanding recommendations for meals to include 15-35% energy as protein, 25-35% energy from fat, 35-60% energy from carbohydrates, less than 200mg  of dietary cholesterol, 20-35 gm of total fiber daily;Understanding of distribution of calorie intake throughout the day with the consumption of 4-5 meals/snacks    Tobacco Cessation  Yes    Intervention  Assist the participant in steps to quit. Provide individualized education and counseling about committing to Tobacco Cessation, relapse prevention, and  pharmacological support that can be provided by physician.;Advice worker, assist with locating and accessing local/national Quit Smoking programs, and support quit date choice.    Expected Outcomes  Short Term: Will demonstrate readiness to quit, by selecting a quit date.    Hypertension  Yes    Intervention  Provide education on lifestyle modifcations including regular physical activity/exercise, weight management, moderate sodium restriction and increased consumption of fresh fruit, vegetables, and low fat dairy, alcohol moderation, and smoking cessation.;Monitor prescription use compliance.    Expected Outcomes  Short Term: Continued assessment and intervention until BP is < 140/22mm HG in hypertensive participants. < 130/36mm HG in hypertensive participants with diabetes, heart failure or chronic kidney disease.;Long Term: Maintenance of blood pressure at goal levels.    Lipids  Yes    Intervention  Provide education and support for participant on nutrition & aerobic/resistive exercise along with prescribed medications to achieve LDL 70mg , HDL >40mg .    Expected Outcomes  Short Term: Participant states understanding of desired cholesterol values and is compliant with medications prescribed. Participant is following exercise prescription and nutrition guidelines.;Long Term: Cholesterol controlled with medications as prescribed, with individualized exercise RX and with personalized nutrition plan. Value goals: LDL < 70mg , HDL > 40 mg.    Stress  Yes    Intervention  Offer individual and/or small group education and counseling on adjustment to heart disease, stress management and health-related lifestyle change. Teach and support self-help strategies.;Refer participants experiencing significant psychosocial distress to appropriate mental health specialists for further evaluation and treatment. When possible, include family members and significant others in education/counseling sessions.     Expected Outcomes  Short Term: Participant demonstrates changes in health-related behavior, relaxation and other stress management skills, ability to obtain effective social support, and compliance with psychotropic medications if prescribed.;Long Term: Emotional wellbeing is indicated by absence of clinically significant psychosocial distress or social isolation.           Core Components/Risk Factors/Patient Goals Review:    Core Components/Risk Factors/Patient Goals at Discharge (Final Review):    ITP Comments: ITP Comments    Row Name 03/18/18 0907   ITP Comments  Medical Director- Dr. Fransico Him, MD      Comments:  Andi Hence, RN, BSN Cardiac Pulmonary Rehab

## 2018-04-02 ENCOUNTER — Telehealth (HOSPITAL_COMMUNITY): Payer: Self-pay

## 2018-04-02 ENCOUNTER — Ambulatory Visit (HOSPITAL_COMMUNITY): Payer: Self-pay

## 2018-04-02 NOTE — Telephone Encounter (Signed)
Phone call returned to Pt to inform her of closing of Cardiac Rehab for an additional four weeks due to [place]. Pt did not answer. Phone continued to ring. No voicemail was made.

## 2018-04-03 ENCOUNTER — Telehealth (HOSPITAL_COMMUNITY): Payer: Self-pay

## 2018-04-03 NOTE — Telephone Encounter (Signed)
Phone call to pt for nutrition education and counseling for heart healthy diet. Left voicemail with contact information.   Laurina Bustle, MS, RD, LDN 04/03/2018 1:59 PM

## 2018-04-04 ENCOUNTER — Ambulatory Visit (HOSPITAL_COMMUNITY): Payer: Self-pay

## 2018-04-07 ENCOUNTER — Ambulatory Visit (HOSPITAL_COMMUNITY): Payer: Self-pay

## 2018-04-09 ENCOUNTER — Ambulatory Visit (HOSPITAL_COMMUNITY): Payer: Self-pay

## 2018-04-11 ENCOUNTER — Ambulatory Visit (HOSPITAL_COMMUNITY): Payer: Self-pay

## 2018-04-14 ENCOUNTER — Ambulatory Visit (HOSPITAL_COMMUNITY): Payer: Self-pay

## 2018-04-16 ENCOUNTER — Ambulatory Visit (HOSPITAL_COMMUNITY): Payer: Self-pay

## 2018-04-16 DIAGNOSIS — R7303 Prediabetes: Secondary | ICD-10-CM | POA: Diagnosis not present

## 2018-04-16 DIAGNOSIS — I10 Essential (primary) hypertension: Secondary | ICD-10-CM | POA: Diagnosis not present

## 2018-04-16 DIAGNOSIS — Z0000 Encounter for general adult medical examination without abnormal findings: Secondary | ICD-10-CM | POA: Diagnosis not present

## 2018-04-17 ENCOUNTER — Telehealth (HOSPITAL_COMMUNITY): Payer: Self-pay | Admitting: Cardiac Rehabilitation

## 2018-04-17 NOTE — Telephone Encounter (Signed)
Pt phone call to inform of continued Outpatient Cardiac Rehab departmental closure for COVID 19 precautions. Future opening date to be determined. Pt instructed to continue exercising on her own following home exercise guidelines.She is continuing to walk on her own. Pt advised to contact cardiology or PCP PRN symptoms, questions or concerns.  Left message on answering machine.  Andi Hence, RN, BSN Cardiac Pulmonary Rehab

## 2018-04-18 ENCOUNTER — Ambulatory Visit (HOSPITAL_COMMUNITY): Payer: Self-pay

## 2018-04-21 ENCOUNTER — Ambulatory Visit (HOSPITAL_COMMUNITY): Payer: Self-pay

## 2018-04-23 ENCOUNTER — Ambulatory Visit (HOSPITAL_COMMUNITY): Payer: Self-pay

## 2018-04-23 DIAGNOSIS — R7303 Prediabetes: Secondary | ICD-10-CM | POA: Diagnosis not present

## 2018-04-23 DIAGNOSIS — N184 Chronic kidney disease, stage 4 (severe): Secondary | ICD-10-CM | POA: Diagnosis not present

## 2018-04-23 DIAGNOSIS — E7800 Pure hypercholesterolemia, unspecified: Secondary | ICD-10-CM | POA: Diagnosis not present

## 2018-04-23 DIAGNOSIS — E559 Vitamin D deficiency, unspecified: Secondary | ICD-10-CM | POA: Diagnosis not present

## 2018-04-23 DIAGNOSIS — I10 Essential (primary) hypertension: Secondary | ICD-10-CM | POA: Diagnosis not present

## 2018-04-23 DIAGNOSIS — Z0000 Encounter for general adult medical examination without abnormal findings: Secondary | ICD-10-CM | POA: Diagnosis not present

## 2018-04-25 ENCOUNTER — Ambulatory Visit (HOSPITAL_COMMUNITY): Payer: Self-pay

## 2018-04-28 ENCOUNTER — Ambulatory Visit (HOSPITAL_COMMUNITY): Payer: Self-pay

## 2018-04-30 ENCOUNTER — Ambulatory Visit (HOSPITAL_COMMUNITY): Payer: Self-pay

## 2018-05-02 ENCOUNTER — Ambulatory Visit (HOSPITAL_COMMUNITY): Payer: Self-pay

## 2018-05-05 ENCOUNTER — Ambulatory Visit (HOSPITAL_COMMUNITY): Payer: Self-pay

## 2018-05-07 ENCOUNTER — Ambulatory Visit (HOSPITAL_COMMUNITY): Payer: Self-pay

## 2018-05-09 ENCOUNTER — Ambulatory Visit (HOSPITAL_COMMUNITY): Payer: Self-pay

## 2018-05-12 ENCOUNTER — Ambulatory Visit (HOSPITAL_COMMUNITY): Payer: Self-pay

## 2018-05-14 ENCOUNTER — Ambulatory Visit (HOSPITAL_COMMUNITY): Payer: Self-pay

## 2018-05-16 ENCOUNTER — Ambulatory Visit (HOSPITAL_COMMUNITY): Payer: Self-pay

## 2018-05-19 ENCOUNTER — Ambulatory Visit (HOSPITAL_COMMUNITY): Payer: Self-pay

## 2018-05-21 ENCOUNTER — Ambulatory Visit (HOSPITAL_COMMUNITY): Payer: Self-pay

## 2018-05-23 ENCOUNTER — Ambulatory Visit (HOSPITAL_COMMUNITY): Payer: Self-pay

## 2018-05-26 ENCOUNTER — Ambulatory Visit (HOSPITAL_COMMUNITY): Payer: Self-pay

## 2018-05-28 ENCOUNTER — Ambulatory Visit (HOSPITAL_COMMUNITY): Payer: Self-pay

## 2018-05-28 DIAGNOSIS — M5136 Other intervertebral disc degeneration, lumbar region: Secondary | ICD-10-CM | POA: Diagnosis not present

## 2018-05-28 DIAGNOSIS — M797 Fibromyalgia: Secondary | ICD-10-CM | POA: Diagnosis not present

## 2018-05-28 DIAGNOSIS — M150 Primary generalized (osteo)arthritis: Secondary | ICD-10-CM | POA: Diagnosis not present

## 2018-05-28 DIAGNOSIS — M0579 Rheumatoid arthritis with rheumatoid factor of multiple sites without organ or systems involvement: Secondary | ICD-10-CM | POA: Diagnosis not present

## 2018-05-28 DIAGNOSIS — M2550 Pain in unspecified joint: Secondary | ICD-10-CM | POA: Diagnosis not present

## 2018-05-30 ENCOUNTER — Ambulatory Visit (HOSPITAL_COMMUNITY): Payer: Self-pay

## 2018-06-03 ENCOUNTER — Telehealth (HOSPITAL_COMMUNITY): Payer: Self-pay

## 2018-06-03 NOTE — Telephone Encounter (Signed)
Phone call made to Pt to provide information about virtual cardiac rehab. Pt did not answer. Message was left for Pt to return call.

## 2018-06-04 ENCOUNTER — Ambulatory Visit (HOSPITAL_COMMUNITY): Payer: Self-pay

## 2018-06-05 ENCOUNTER — Telehealth (HOSPITAL_COMMUNITY): Payer: Self-pay | Admitting: *Deleted

## 2018-06-05 NOTE — Telephone Encounter (Signed)
No response from message left on voicemail regarding Virtual Cardiac Rehab for the continuation of her participation.  Please contact letter sent to address on file in epic. Await response, if no response by June 12th will discharge pt from the program.  Maurice Small RN, BSN Cardiac and Pulmonary Rehab Nurse Navigator

## 2018-06-06 ENCOUNTER — Ambulatory Visit (HOSPITAL_COMMUNITY): Payer: Self-pay

## 2018-06-09 ENCOUNTER — Ambulatory Visit (HOSPITAL_COMMUNITY): Payer: Self-pay

## 2018-06-11 ENCOUNTER — Ambulatory Visit (HOSPITAL_COMMUNITY): Payer: Self-pay

## 2018-06-13 ENCOUNTER — Ambulatory Visit (HOSPITAL_COMMUNITY): Payer: Self-pay

## 2018-06-16 ENCOUNTER — Ambulatory Visit (HOSPITAL_COMMUNITY): Payer: Self-pay

## 2018-06-18 ENCOUNTER — Ambulatory Visit (HOSPITAL_COMMUNITY): Payer: Self-pay

## 2018-06-20 ENCOUNTER — Ambulatory Visit (HOSPITAL_COMMUNITY): Payer: Self-pay

## 2018-06-23 ENCOUNTER — Ambulatory Visit (HOSPITAL_COMMUNITY): Payer: Self-pay

## 2018-06-23 DIAGNOSIS — M48061 Spinal stenosis, lumbar region without neurogenic claudication: Secondary | ICD-10-CM | POA: Diagnosis not present

## 2018-06-23 DIAGNOSIS — M545 Low back pain: Secondary | ICD-10-CM | POA: Diagnosis not present

## 2018-06-25 ENCOUNTER — Ambulatory Visit (HOSPITAL_COMMUNITY): Payer: Self-pay

## 2018-06-25 NOTE — Progress Notes (Addendum)
Pt never returned to CR after initial orientation. Phone calls were made to Pt as well as a letter through the mail to encourage Pt to return our call. Pt did not respond. Pt will be discharged from CR.   Janet Johnston BS, ACSM CEP 06/25/2018 12:00 PM

## 2018-06-25 NOTE — Addendum Note (Signed)
Encounter addended by: Jeralyn Bennett on: 06/25/2018 12:00 PM  Actions taken: Clinical Note Signed

## 2018-06-26 ENCOUNTER — Telehealth: Payer: Self-pay | Admitting: Adult Health

## 2018-06-26 NOTE — Telephone Encounter (Signed)
New Message           Janet Johnston is returning someone's call about insurance and address information. I think it's one of the girls up front Patient states they sounded black. Patient states nothing has changed with her address or her insurance and if they need to call her back they can.

## 2018-06-27 NOTE — Telephone Encounter (Signed)
call home phone/ consent/ my chart via emailed/ pre reg completed

## 2018-06-30 ENCOUNTER — Encounter (HOSPITAL_COMMUNITY): Payer: Self-pay | Admitting: *Deleted

## 2018-06-30 DIAGNOSIS — I129 Hypertensive chronic kidney disease with stage 1 through stage 4 chronic kidney disease, or unspecified chronic kidney disease: Secondary | ICD-10-CM | POA: Diagnosis not present

## 2018-06-30 DIAGNOSIS — N184 Chronic kidney disease, stage 4 (severe): Secondary | ICD-10-CM | POA: Diagnosis not present

## 2018-06-30 DIAGNOSIS — N261 Atrophy of kidney (terminal): Secondary | ICD-10-CM | POA: Diagnosis not present

## 2018-06-30 NOTE — Progress Notes (Signed)
Janet Johnston attended orientation prior to the closure of cardiac rehab due to the [place] 19 Pandemic. We have attempted to reach out to Janet Johnston regarding virtual cardiac rehab and have heard no response therefore will discharge from the program at this time.Barnet Pall, RN,BSN 06/30/2018 2:38 PM

## 2018-07-02 ENCOUNTER — Telehealth (INDEPENDENT_AMBULATORY_CARE_PROVIDER_SITE_OTHER): Payer: Medicare Other | Admitting: Adult Health

## 2018-07-02 ENCOUNTER — Encounter: Payer: Self-pay | Admitting: Adult Health

## 2018-07-02 VITALS — BP 123/80 | HR 76 | Ht 64.0 in | Wt 203.2 lb

## 2018-07-02 DIAGNOSIS — I700 Atherosclerosis of aorta: Secondary | ICD-10-CM

## 2018-07-02 DIAGNOSIS — I2510 Atherosclerotic heart disease of native coronary artery without angina pectoris: Secondary | ICD-10-CM

## 2018-07-02 DIAGNOSIS — E7800 Pure hypercholesterolemia, unspecified: Secondary | ICD-10-CM

## 2018-07-02 DIAGNOSIS — I10 Essential (primary) hypertension: Secondary | ICD-10-CM

## 2018-07-02 MED ORDER — AMLODIPINE BESYLATE 5 MG PO TABS: 5.0000 mg | ORAL_TABLET | Freq: Every day | ORAL | 1 refills | Status: DC

## 2018-07-02 MED ORDER — TICAGRELOR 90 MG PO TABS: 90.0000 mg | ORAL_TABLET | Freq: Two times a day (BID) | ORAL | 1 refills | Status: DC

## 2018-07-02 NOTE — Progress Notes (Signed)
Virtual Visit via Telephone Note   This visit type was conducted due to national recommendations for restrictions regarding the COVID-19 Pandemic (e.g. social distancing) in an effort to limit this patient's exposure and mitigate transmission in our community.  Due to her co-morbid illnesses, this patient is at least at moderate risk for complications without adequate follow up.  This format is felt to be most appropriate for this patient at this time.  The patient did not have access to video technology/had technical difficulties with video requiring transitioning to audio format only (telephone).  All issues noted in this document were discussed and addressed.  No physical exam could be performed with this format.  Please refer to the patient's chart for her  consent to telehealth for [facility].   Date:  07/02/2018   ID:  Janet Johnston, Janet Johnston, Janet Johnston, MRN [id number]  Patient Location: Home Provider Location: Home  PCP:  Holland Commons, FNP  Cardiologist:  Kirk Ruths, MD  Electrophysiologist:  None   Evaluation Performed:  Follow-Up Visit  Chief Complaint:  Follow up   History of Present Illness:    Janet Johnston is a 68 y.o. female we are following for known history of coronary artery disease, achalasia of the abdominal aorta, hypertension, hyperlipidemia and former tobacco abuse.  Most recent cardiac catheterization was completed by Dr. Martinique which demonstrated proximal LAD stenosis requiring a PCI and DES, normal LVEF of 55% to 65%.  She was placed on dual antiplatelet therapy with aspirin and Brilinta.    She was last seen in the office on 03/17/2018 and at that time was not experiencing any cardiac complaints.  She continues to have chronic pain from RA and fibromyalgia and therefore she is quite sedentary.  She does have some shortness of breath on exertion related to deconditioning.  She was to undergo cardiac rehab on day after last appointment.  She also has a  lot of family stress with a son who continues to live with her.  No changes were made to her medical regimen.  She was at the beach a couple of weeks ago, fell while bending over to brush sand off of feet and injured her back she stated that the chest x-ray revealed a large amount of calcium in her aorta.  She is concerned about this and would like to have her ultrasound repeated.  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  She was told to keep BP controlled. See is seeing Dr. Vanetta Mulders for CKD Stage III.   The patient does not  have symptoms concerning for COVID-19 infection (fever, chills, cough, or new shortness of breath).    Past Medical History:  Diagnosis Date  . Anxiety   . Asthmatic bronchitis   . Chronic bronchitis ([place])    "get it q yr; we head w/wood stove" (12/23/2017)  . Chronic kidney disease    "Creatinine level elevated last couple years" (12/23/2017)  . Chronic lower back pain   . COPD (chronic obstructive pulmonary disease) ([place])   . Coronary artery disease    a. LHC 12/23/2017: pLAD to mLAD 95% s/p DES  . Depression   . DJD (degenerative joint disease)   . Fibromyalgia   . GERD (gastroesophageal reflux disease)   . History of hiatal hernia   . History of kidney stones   . Hyperlipidemia   . Hypertension   .  Pneumonia   . Prediabetes   . Rheumatoid arthritis ([place])   . Sleep apnea    "home test indicated that I had it; I've never used mask" (12/23/2017)  . Tension headache    "controlled" (12/23/2017)   Past Surgical History:  Procedure Laterality Date  . BACK SURGERY    . CARDIAC CATHETERIZATION N/A 04/08/2015   Procedure: Left Heart Cath and Coronary Angiography;  Surgeon: [name], MD;  Location: [place] [facility];  Service: Cardiovascular;  Laterality: N/A;  . CARDIAC CATHETERIZATION N/A 04/08/2015   Procedure: Intravascular Pressure Wire/FFR Study;   Surgeon: [name], MD;  Location: [place] [facility];  Service: Cardiovascular;  Laterality: N/A;  . CATARACT EXTRACTION W/ INTRAOCULAR LENS IMPLANT     "? side" (12/23/2017)  . CORONARY ANGIOPLASTY WITH STENT PLACEMENT  12/23/2017  . CORONARY STENT INTERVENTION N/A 12/23/2017   Procedure: CORONARY STENT INTERVENTION;  Surgeon: Martinique, [name], MD;  Location: [place] [facility];  Service: Cardiovascular;  Laterality: N/A;  . LEFT HEART CATH AND CORONARY ANGIOGRAPHY N/A 12/23/2017   Procedure: LEFT HEART CATH AND CORONARY ANGIOGRAPHY;  Surgeon: Martinique, [name], MD;  Location: [place] [facility];  Service: Cardiovascular;  Laterality: N/A;  . LUMBAR [place]     "ruptured disc"     No outpatient medications have been marked as taking for the 07/02/18 encounter (Appointment) with Lendon Colonel, NP.     Allergies:   Zetia [ezetimibe], Lyrica [pregabalin], Penicillins, and Sulfa antibiotics   Social History   Tobacco Use  . Smoking status: Current Every Day Smoker    Packs/day: 0.50  . Smokeless tobacco: Never Used  Substance Use Topics  . Alcohol use: Yes    Comment: 12/23/2017 "maybe 1 drink q 6 months"  . Drug use: Never     Family Hx: The patient's family history includes Diabetes in her mother; Emphysema in her father; Heart disease in her mother; Heart failure in her father and mother; Hypertension in her mother.  ROS:   Please see the history of present illness.    All other systems reviewed and are negative.   Prior CV studies:   The following studies were reviewed today: Left Heart Catheterization 12/23/2017:  Prox LAD to Mid LAD lesion is 95% stenosed.  Post intervention, there is a 0% residual stenosis.  A drug-eluting stent was successfully placed using a STENT SYNERGY DES 3X24.  The left ventricular systolic function is normal.  LV end diastolic pressure is normal.  The left ventricular ejection fraction is 55-65% by visual estimate.  1.  Single vessel obstructive CAD involving the mid LAD 2. Normal LV function 3. Normal LVEDP 4. Successful PCI of the LAD with DES x 1.   Plan:DAPT for one year.  Abdominal Ultrasound 04/10/2017 ABDOMINAL AORTA STUDY  Indications: Follow up exam for known AAA.  Risk         Hypertension, hyperlipidemia, current smoker, coronary artery Factors:     disease.  Other: In 6/16, abdominal ultrasound showed an ectatic aorta that measured 2.9        cm. Patient denies any abdominal pain.  Limitations: Obesity.  Labs/Other Tests and Data Reviewed:    EKG:  No ECG reviewed.  Recent Labs: 12/24/2017: BUN 24; Creatinine, Ser 1.57; Hemoglobin 12.5; Platelets PLATELET CLUMPS NOTED ON SMEAR, UNABLE TO ESTIMATE; Potassium 3.9; Sodium 140   Recent Lipid Panel Lab Results  Component Value Date/Time   CHOL 224 (H) 05/30/2016 12:56 PM  TRIG 167 (H) 05/30/2016 12:56 PM   HDL 54 05/30/2016 12:56 PM   CHOLHDL 4.1 05/30/2016 12:56 PM   CHOLHDL 3.7 04/06/2015 03:44 PM   LDLCALC 137 (H) 05/30/2016 12:56 PM    Wt Readings from Last 3 Encounters:  03/18/18 202 lb 13.2 oz (92 kg)  03/17/18 204 lb (92.5 kg)  01/06/18 193 lb 12.8 oz (87.9 kg)     Objective:    Vital Signs:  There were no vitals taken for this visit.   VITAL SIGNS:  reviewed GEN:  no acute distress NEURO:  alert and oriented x 3, no obvious focal deficit PSYCH:  normal affect  ASSESSMENT & PLAN:    1.  Aortic calcification: No prior history of AAA but she has been evaluated for this in the past.  She had an ultrasound completed a year ago which did not reveal any evidence of AAA with diameter of aorta at 2.9 cm.  The patient is quite anxious about this and would like to be rechecked as it was prominent on her chest x-ray.  I will order it for comparison of prior ultrasound.  I doubt there will be any major changes in aortic size.  2. Hypertension: BP is well controlled currently. No changes in her regimen.   3. CAD:  She  has had PCI to LAD in the setting of stenosis with DES on 12/26/2017. She remains on DAPT with Brilinta and ASA. She denies chest pain  She has some dyspnea with bending over but not with walking.  No changes on her current regimen.   4. Back pain: She is to have injection into her back. Physician states that he does not need her to have [place] held. [name]   COVID-19 Education: The signs and symptoms of COVID-19 were discussed with the patient and how to seek care for testing (follow up with PCP or arrange E-visit).  The importance of social distancing was discussed today.  Time:   Today, I have spent Johnston minutes with the patient with telehealth technology discussing the above problems.     Medication Adjustments/Labs and Tests Ordered: Current medicines are reviewed at length with the patient today.  Concerns regarding medicines are outlined above.   Tests Ordered: No orders of the defined types were placed in this encounter.   Medication Changes: No orders of the defined types were placed in this encounter.   Disposition:  Follow up 6 months  Signed, Phill Myron. [place] Pugh, ANP, [facility]  07/02/2018 6:53 AM    [facility]

## 2018-07-02 NOTE — Patient Instructions (Signed)
Medication Instructions:  NO CHANGES- Your physician recommends that you continue on your current medications as directed. Please refer to the Current Medication list given to you today. If you need a refill on your cardiac medications before your next appointment, please call your pharmacy.  Testing/Procedures: Your physician has requested that you have an abdominal ultrasound During this test, an ultrasound is used to evaluate the aorta. Allow 30 minutes for this exam This will be done at [facility], [place]. If you should need to call them their phone number is [phone].  Follow-Up: You will need a follow up appointment in 6 months.  Please call our office 2 months in advance, October 2020 to schedule this December 2020 appointment.  You may see Kirk Ruths, MD , Jory Sims, DNP, AACC or one of the following Advanced Practice Providers on your designated Care Team:  Kerin Ransom, PA-C  Roby Lofts, PA-C  Sande Rives, [place]       At [facility], you and your health needs are our priority.  As part of our continuing mission to provide you with exceptional heart care, we have created designated Provider Care Teams.  These Care Teams include your primary Cardiologist (physician) and Advanced Practice Providers (APPs -  Physician Assistants and Nurse Practitioners) who all work together to provide you with the care you need, when you need it.  Thank you for choosing [facility] at [facility]!!

## 2018-07-09 NOTE — Telephone Encounter (Signed)
Open n error °

## 2018-07-14 ENCOUNTER — Ambulatory Visit
Admission: RE | Admit: 2018-07-14 | Discharge: 2018-07-14 | Disposition: A | Discharge status: Home / Self Care | Payer: Medicare Other | Source: Ambulatory Visit | Attending: Adult Health | Admitting: Adult Health

## 2018-07-14 DIAGNOSIS — I77811 Abdominal aortic ectasia: Secondary | ICD-10-CM | POA: Diagnosis not present

## 2018-07-14 DIAGNOSIS — I700 Atherosclerosis of aorta: Secondary | ICD-10-CM

## 2018-07-15 DIAGNOSIS — M4316 Spondylolisthesis, lumbar region: Secondary | ICD-10-CM | POA: Diagnosis not present

## 2018-07-15 DIAGNOSIS — M961 Postlaminectomy syndrome, not elsewhere classified: Secondary | ICD-10-CM | POA: Diagnosis not present

## 2018-07-29 DIAGNOSIS — G894 Chronic pain syndrome: Secondary | ICD-10-CM | POA: Diagnosis not present

## 2018-07-29 DIAGNOSIS — M4316 Spondylolisthesis, lumbar region: Secondary | ICD-10-CM | POA: Diagnosis not present

## 2018-07-29 DIAGNOSIS — M961 Postlaminectomy syndrome, not elsewhere classified: Secondary | ICD-10-CM | POA: Diagnosis not present

## 2018-08-11 ENCOUNTER — Telehealth: Payer: Self-pay | Admitting: Adult Health

## 2018-08-11 NOTE — Telephone Encounter (Signed)
Spoke with pt who states she has DOE and that she is out of breath walking to and from her car. She also states she has woken up a few times at night with SOB. Denies CP, edema. Pt states she is on ASA and Brilinta. She has some bruising to arms, legs, abdomen. She states the smallest touch (i.e. from her dog) will cause her to bruise. No dark stools. She states that she saw a $5 coupon for Brilinta on Facebook and she clicked on it and noticed that many people commented c/o SOB. Since her SOB has progressed she is wondering if Brilinta is the cause for the increased SOB. She states that a nurse in the hospital told her that [place] may cause SOB and advised her to drink coffee after taking it because the caffeine may help. Informed pt that message would be routed to pharmD to advise and nurse to set up [place] after reviewing pharmD recommendations. Pt verbalized understanding

## 2018-08-11 NOTE — Telephone Encounter (Signed)
True. Brilinta may cause SOB in some patients and caffeine will improve tolerability.  Please take Brilinta with caffeinated beverage to improve SOB.   Aspirin and Brilinta will increase bruising but need to continue to keep stent "healthy". Cardiologist will re-assess during follow upo tp determine when appropriate to change therapy.  Brilinta $5 coupon is applicable to co-pays for commercial insurance ONLY. If patient meets this criteria, please provide $5 co-pay card.

## 2018-08-11 NOTE — Telephone Encounter (Signed)
New Message    Pt c/o Shortness Of Breath: STAT if SOB developed within the last 24 hours or pt is noticeably SOB on the phone  1. Are you currently SOB (can you hear that pt is SOB on the phone)? No  2. How long have you been experiencing SOB? Progressively got worse since January   3. Are you SOB when sitting or when up moving around? Both, but mainly when moving around.  4. Are you currently experiencing any other symptoms? Bruising on arms, legs and stomach.

## 2018-08-14 DIAGNOSIS — H2512 Age-related nuclear cataract, left eye: Secondary | ICD-10-CM | POA: Diagnosis not present

## 2018-08-14 DIAGNOSIS — Z79899 Other long term (current) drug therapy: Secondary | ICD-10-CM | POA: Diagnosis not present

## 2018-08-18 NOTE — Telephone Encounter (Signed)
The patient called back to check on a reply from her message last week. She has been advised on the pharmacists recommendation. She stated that she already takes the Brilinta with caffeine and still has the shortness of breath. She does not want to take the Brilinta anymore. She would like to know what other options there are for her. She does only have one kidney that functions at 35-40 %.

## 2018-08-18 NOTE — Telephone Encounter (Signed)
New Message    Pt c/o medication issue:  1. Name of Medication: Brillinta   2. How are you currently taking this medication (dosage and times per day)? 90mg  1 tablet by mouth 2 times daily  3. Are you having a reaction (difficulty breathing--STAT)? NO  4. What is your medication issue? Patient states it's causing SOB issues.

## 2018-08-18 NOTE — Telephone Encounter (Signed)
She can stop Brilinta and begin Plavix 75 mg daily. She will need to have a loading dose of 300 mg one time, and then start taking 75 mg daily thereafter. She will not take the Brilinta on the day she begins the Plavix.

## 2018-08-18 NOTE — Telephone Encounter (Signed)
Changes on antiplatelet need okay by MD. Please forward to cardiologist for advise.

## 2018-08-19 MED ORDER — CLOPIDOGREL BISULFATE 75 MG PO TABS: ORAL_TABLET | ORAL | 3 refills | Status: DC

## 2018-08-19 NOTE — Telephone Encounter (Signed)
Pt aware of her medications changes, Rx has been sent to the pharmacy electronically. Plavix 75 mg by mouth daily Brilinta D/C

## 2018-08-19 NOTE — Telephone Encounter (Signed)
Agree Janet Johnston  

## 2018-09-11 DIAGNOSIS — Z79899 Other long term (current) drug therapy: Secondary | ICD-10-CM | POA: Diagnosis not present

## 2018-09-25 DIAGNOSIS — L718 Other rosacea: Secondary | ICD-10-CM | POA: Diagnosis not present

## 2018-09-25 DIAGNOSIS — D2262 Melanocytic nevi of left upper limb, including shoulder: Secondary | ICD-10-CM | POA: Diagnosis not present

## 2018-09-25 DIAGNOSIS — L918 Other hypertrophic disorders of the skin: Secondary | ICD-10-CM | POA: Diagnosis not present

## 2018-09-25 DIAGNOSIS — L570 Actinic keratosis: Secondary | ICD-10-CM | POA: Diagnosis not present

## 2018-09-25 DIAGNOSIS — D692 Other nonthrombocytopenic purpura: Secondary | ICD-10-CM | POA: Diagnosis not present

## 2018-09-29 ENCOUNTER — Other Ambulatory Visit: Payer: Self-pay | Admitting: Registered Nurse

## 2018-09-29 DIAGNOSIS — Z1231 Encounter for screening mammogram for malignant neoplasm of breast: Secondary | ICD-10-CM

## 2018-10-15 DIAGNOSIS — Z0000 Encounter for general adult medical examination without abnormal findings: Secondary | ICD-10-CM | POA: Diagnosis not present

## 2018-10-15 DIAGNOSIS — E7800 Pure hypercholesterolemia, unspecified: Secondary | ICD-10-CM | POA: Diagnosis not present

## 2018-10-15 DIAGNOSIS — I10 Essential (primary) hypertension: Secondary | ICD-10-CM | POA: Diagnosis not present

## 2018-10-15 DIAGNOSIS — E559 Vitamin D deficiency, unspecified: Secondary | ICD-10-CM | POA: Diagnosis not present

## 2018-10-16 DIAGNOSIS — M797 Fibromyalgia: Secondary | ICD-10-CM | POA: Diagnosis not present

## 2018-10-16 DIAGNOSIS — M150 Primary generalized (osteo)arthritis: Secondary | ICD-10-CM | POA: Diagnosis not present

## 2018-10-16 DIAGNOSIS — M5136 Other intervertebral disc degeneration, lumbar region: Secondary | ICD-10-CM | POA: Diagnosis not present

## 2018-10-16 DIAGNOSIS — M2550 Pain in unspecified joint: Secondary | ICD-10-CM | POA: Diagnosis not present

## 2018-10-16 DIAGNOSIS — M0579 Rheumatoid arthritis with rheumatoid factor of multiple sites without organ or systems involvement: Secondary | ICD-10-CM | POA: Diagnosis not present

## 2018-10-22 DIAGNOSIS — N261 Atrophy of kidney (terminal): Secondary | ICD-10-CM | POA: Diagnosis not present

## 2018-10-22 DIAGNOSIS — Z23 Encounter for immunization: Secondary | ICD-10-CM | POA: Diagnosis not present

## 2018-10-22 DIAGNOSIS — I10 Essential (primary) hypertension: Secondary | ICD-10-CM | POA: Diagnosis not present

## 2018-10-22 DIAGNOSIS — I2510 Atherosclerotic heart disease of native coronary artery without angina pectoris: Secondary | ICD-10-CM | POA: Diagnosis not present

## 2018-10-22 DIAGNOSIS — E7800 Pure hypercholesterolemia, unspecified: Secondary | ICD-10-CM | POA: Diagnosis not present

## 2018-10-22 DIAGNOSIS — E559 Vitamin D deficiency, unspecified: Secondary | ICD-10-CM | POA: Diagnosis not present

## 2018-11-05 DIAGNOSIS — I2510 Atherosclerotic heart disease of native coronary artery without angina pectoris: Secondary | ICD-10-CM | POA: Diagnosis not present

## 2018-11-05 DIAGNOSIS — M791 Myalgia, unspecified site: Secondary | ICD-10-CM | POA: Diagnosis not present

## 2018-11-05 DIAGNOSIS — Z789 Other specified health status: Secondary | ICD-10-CM | POA: Diagnosis not present

## 2018-11-17 ENCOUNTER — Other Ambulatory Visit: Payer: Self-pay

## 2018-11-17 ENCOUNTER — Ambulatory Visit
Admission: RE | Admit: 2018-11-17 | Discharge: 2018-11-17 | Disposition: A | Discharge status: Home / Self Care | Payer: Medicare Other | Source: Ambulatory Visit | Attending: Registered Nurse | Admitting: Registered Nurse

## 2018-11-17 DIAGNOSIS — Z1231 Encounter for screening mammogram for malignant neoplasm of breast: Secondary | ICD-10-CM | POA: Diagnosis not present

## 2018-11-19 ENCOUNTER — Other Ambulatory Visit: Payer: Self-pay

## 2018-11-19 ENCOUNTER — Encounter: Payer: Self-pay | Admitting: Adult Health

## 2018-11-19 ENCOUNTER — Ambulatory Visit: Payer: Medicare Other | Admitting: Adult Health

## 2018-11-19 VITALS — BP 112/68 | HR 71 | Ht 64.0 in | Wt 206.0 lb

## 2018-11-19 DIAGNOSIS — I2510 Atherosclerotic heart disease of native coronary artery without angina pectoris: Secondary | ICD-10-CM

## 2018-11-19 DIAGNOSIS — I10 Essential (primary) hypertension: Secondary | ICD-10-CM

## 2018-11-19 DIAGNOSIS — E7800 Pure hypercholesterolemia, unspecified: Secondary | ICD-10-CM

## 2018-11-19 NOTE — Patient Instructions (Signed)
Medication Instructions:  DECREASE- Amlodipine 2.5 mg by mouth daily  *If you need a refill on your cardiac medications before your next appointment, please call your pharmacy*  Lab Work: None Ordered  Testing/Procedures: None Ordered  Follow-Up: At [facility], you and your health needs are our priority.  As part of our continuing mission to provide you with exceptional heart care, we have created designated Provider Care Teams.  These Care Teams include your primary Cardiologist (physician) and Advanced Practice Providers (APPs -  Physician Assistants and Nurse Practitioners) who all work together to provide you with the care you need, when you need it.  Your next appointment:   6 months  The format for your next appointment:   In Person  Provider:   Kirk Ruths, MD
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
## 2018-12-02 DIAGNOSIS — M961 Postlaminectomy syndrome, not elsewhere classified: Secondary | ICD-10-CM | POA: Diagnosis not present

## 2018-12-16 DIAGNOSIS — G894 Chronic pain syndrome: Secondary | ICD-10-CM | POA: Diagnosis not present

## 2018-12-16 DIAGNOSIS — M545 Low back pain: Secondary | ICD-10-CM | POA: Diagnosis not present

## 2018-12-16 DIAGNOSIS — M961 Postlaminectomy syndrome, not elsewhere classified: Secondary | ICD-10-CM | POA: Diagnosis not present

## 2018-12-16 DIAGNOSIS — M4316 Spondylolisthesis, lumbar region: Secondary | ICD-10-CM | POA: Diagnosis not present

## 2018-12-18 DIAGNOSIS — I129 Hypertensive chronic kidney disease with stage 1 through stage 4 chronic kidney disease, or unspecified chronic kidney disease: Secondary | ICD-10-CM | POA: Diagnosis not present

## 2018-12-18 DIAGNOSIS — N184 Chronic kidney disease, stage 4 (severe): Secondary | ICD-10-CM | POA: Diagnosis not present

## 2018-12-18 DIAGNOSIS — N261 Atrophy of kidney (terminal): Secondary | ICD-10-CM | POA: Diagnosis not present

## 2018-12-25 DIAGNOSIS — M4316 Spondylolisthesis, lumbar region: Secondary | ICD-10-CM | POA: Diagnosis not present

## 2018-12-25 DIAGNOSIS — E7800 Pure hypercholesterolemia, unspecified: Secondary | ICD-10-CM | POA: Diagnosis not present

## 2018-12-30 DIAGNOSIS — I10 Essential (primary) hypertension: Secondary | ICD-10-CM | POA: Diagnosis not present

## 2018-12-30 DIAGNOSIS — I2510 Atherosclerotic heart disease of native coronary artery without angina pectoris: Secondary | ICD-10-CM | POA: Diagnosis not present

## 2019-01-05 DIAGNOSIS — M5416 Radiculopathy, lumbar region: Secondary | ICD-10-CM | POA: Diagnosis not present

## 2019-01-05 DIAGNOSIS — M545 Low back pain: Secondary | ICD-10-CM | POA: Diagnosis not present

## 2019-01-14 DIAGNOSIS — M2550 Pain in unspecified joint: Secondary | ICD-10-CM | POA: Diagnosis not present

## 2019-01-14 DIAGNOSIS — M0579 Rheumatoid arthritis with rheumatoid factor of multiple sites without organ or systems involvement: Secondary | ICD-10-CM | POA: Diagnosis not present

## 2019-01-14 DIAGNOSIS — M5136 Other intervertebral disc degeneration, lumbar region: Secondary | ICD-10-CM | POA: Diagnosis not present

## 2019-01-15 DIAGNOSIS — I2510 Atherosclerotic heart disease of native coronary artery without angina pectoris: Secondary | ICD-10-CM | POA: Diagnosis not present

## 2019-01-15 DIAGNOSIS — I10 Essential (primary) hypertension: Secondary | ICD-10-CM | POA: Diagnosis not present

## 2019-01-15 DIAGNOSIS — E559 Vitamin D deficiency, unspecified: Secondary | ICD-10-CM | POA: Diagnosis not present

## 2019-01-15 DIAGNOSIS — E7800 Pure hypercholesterolemia, unspecified: Secondary | ICD-10-CM | POA: Diagnosis not present

## 2019-02-16 ENCOUNTER — Other Ambulatory Visit: Payer: Self-pay | Admitting: Adult Health

## 2019-03-01 ENCOUNTER — Ambulatory Visit: Payer: Medicare Other | Attending: Internal Medicine

## 2019-03-01 DIAGNOSIS — Z23 Encounter for immunization: Secondary | ICD-10-CM | POA: Insufficient documentation

## 2019-03-01 NOTE — Progress Notes (Signed)
   Covid-19 Vaccination Clinic  Name:  Janet Johnston    MRN: [id number] DOB: April 02, 1950  03/01/2019  [name] was observed post Covid-19 immunization for 15 minutes without incidence. She was provided with Vaccine Information Sheet and instruction to access the V-Safe system.   [name] was instructed to call 911 with any severe reactions post vaccine: [name] Difficulty breathing  . Swelling of your face and throat  . A fast heartbeat  . A bad rash all over your body  . Dizziness and weakness    Immunizations Administered    Name Date Dose VIS Date Route   Pfizer COVID-19 Vaccine 03/01/2019  2:54 PM 0.3 mL 12/19/2018 Intramuscular   Manufacturer: [place]   Lot: [id number]   [place]: [address]-0

## 2019-03-23 DIAGNOSIS — M545 Low back pain: Secondary | ICD-10-CM | POA: Diagnosis not present

## 2019-03-23 DIAGNOSIS — G894 Chronic pain syndrome: Secondary | ICD-10-CM | POA: Diagnosis not present

## 2019-03-25 ENCOUNTER — Ambulatory Visit: Payer: Medicare Other | Attending: Internal Medicine

## 2019-03-25 DIAGNOSIS — Z23 Encounter for immunization: Secondary | ICD-10-CM

## 2019-03-25 NOTE — Progress Notes (Signed)
   Covid-19 Vaccination Clinic  Name:  LUNAH LOSASSO    MRN: [id number] DOB: 02/01/1950  03/25/2019  Ms. Viets was observed post Covid-19 immunization for 15 minutes without incident. She was provided with Vaccine Information Sheet and instruction to access the V-Safe system.   Ms. Asbill was instructed to call 911 with any severe reactions post vaccine: [name] Difficulty breathing  . Swelling of face and throat  . A fast heartbeat  . A bad rash all over body  . Dizziness and weakness   Immunizations Administered    Name Date Dose VIS Date Route   Pfizer COVID-19 Vaccine 03/25/2019  1:25 PM 0.3 mL 12/19/2018 Intramuscular   Manufacturer: [place]   Lot: [id number]   [place]: [id number]

## 2019-04-16 DIAGNOSIS — M2550 Pain in unspecified joint: Secondary | ICD-10-CM | POA: Diagnosis not present

## 2019-04-16 DIAGNOSIS — M5136 Other intervertebral disc degeneration, lumbar region: Secondary | ICD-10-CM | POA: Diagnosis not present

## 2019-04-16 DIAGNOSIS — M0579 Rheumatoid arthritis with rheumatoid factor of multiple sites without organ or systems involvement: Secondary | ICD-10-CM | POA: Diagnosis not present

## 2019-04-22 DIAGNOSIS — E559 Vitamin D deficiency, unspecified: Secondary | ICD-10-CM | POA: Diagnosis not present

## 2019-04-22 DIAGNOSIS — E7800 Pure hypercholesterolemia, unspecified: Secondary | ICD-10-CM | POA: Diagnosis not present

## 2019-04-22 DIAGNOSIS — I10 Essential (primary) hypertension: Secondary | ICD-10-CM | POA: Diagnosis not present

## 2019-04-29 DIAGNOSIS — I10 Essential (primary) hypertension: Secondary | ICD-10-CM | POA: Diagnosis not present

## 2019-04-29 DIAGNOSIS — E7800 Pure hypercholesterolemia, unspecified: Secondary | ICD-10-CM | POA: Diagnosis not present

## 2019-04-29 DIAGNOSIS — I2510 Atherosclerotic heart disease of native coronary artery without angina pectoris: Secondary | ICD-10-CM | POA: Diagnosis not present

## 2019-04-29 DIAGNOSIS — E559 Vitamin D deficiency, unspecified: Secondary | ICD-10-CM | POA: Diagnosis not present

## 2019-04-29 DIAGNOSIS — Z0000 Encounter for general adult medical examination without abnormal findings: Secondary | ICD-10-CM | POA: Diagnosis not present

## 2019-06-09 DIAGNOSIS — I129 Hypertensive chronic kidney disease with stage 1 through stage 4 chronic kidney disease, or unspecified chronic kidney disease: Secondary | ICD-10-CM | POA: Diagnosis not present

## 2019-06-09 DIAGNOSIS — N184 Chronic kidney disease, stage 4 (severe): Secondary | ICD-10-CM | POA: Diagnosis not present

## 2019-06-09 DIAGNOSIS — N261 Atrophy of kidney (terminal): Secondary | ICD-10-CM | POA: Diagnosis not present

## 2019-08-07 ENCOUNTER — Ambulatory Visit: Payer: Medicare Other | Admitting: Adult Health

## 2019-08-09 NOTE — Progress Notes (Signed)
HPI: FUCAD. Cardiac catheterization December 2019 showed 95% proximal to mid LAD which was treated with drug-eluting stent.  Ejection fraction 55 to 65%.  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  Since last seen,she notes dyspnea on exertion.  No orthopnea, PND or pedal edema.  She has occasional chest pain not related to activities and unlike her previous pain prior to her PCI.  It is in different locations on the chest.  She notices it predominantly at night.  Current Outpatient Medications  Medication Sig Dispense Refill  . amLODipine (NORVASC) 5 MG tablet Take 0.5 tablets (2.5 mg total) by mouth daily. 45 tablet 2  . aspirin EC 81 MG tablet Take 1 tablet (81 mg total) by mouth daily. 90 tablet 3  . bisoprolol (ZEBETA) 5 MG tablet Take 5 mg by mouth at bedtime.    . clonazePAM (KLONOPIN) 0.5 MG tablet Take 0.25-0.5 mg by mouth daily as needed for anxiety.    . clopidogrel (PLAVIX) 75 MG tablet Take 300 mg(4 tablets) first day and 1 tablet by mouth daily thereafter 94 tablet 3  . Coenzyme Q10 (CO Q 10) 100 MG CAPS Take 100 mg by mouth daily.     . ergocalciferol (VITAMIN D2) 50000 UNITS capsule Take 50,000 Units by mouth once a week.     [name] FLUoxetine (PROZAC) 40 MG capsule Take 40 mg by mouth daily.    . hydroxychloroquine (PLAQUENIL) 200 MG tablet Take 200 mg by mouth daily.     [name] losartan (COZAAR) 50 MG tablet Take 50 mg by mouth daily.    . Magnesium 500 MG TABS Take 500 mg by mouth daily.    . Multiple Vitamins-Minerals (CENTRUM ADULTS PO) Take 1 tablet by mouth daily.    . nitroGLYCERIN (NITROSTAT) 0.4 MG SL tablet Place 1 tablet (0.4 mg total) under the tongue every 5 (five) minutes as needed for chest pain. 30 tablet 2  . omeprazole (PRILOSEC) 40 MG capsule Take 40 mg by mouth daily. Takes prn    . predniSONE (DELTASONE) 5 MG tablet Take 5 mg by mouth daily with breakfast.    . REPATHA SURECLICK 810 MG/ML SOAJ Inject  1 Syringe into the skin every 14 (fourteen) days.     No current facility-administered medications for this visit.     Past Medical History:  Diagnosis Date  . Anxiety   . Asthmatic bronchitis   . Chronic bronchitis ([place])    "get it q yr; we head w/wood stove" (12/23/2017)  . Chronic kidney disease    "Creatinine level elevated last couple years" (12/23/2017)  . Chronic lower back pain   . COPD (chronic obstructive pulmonary disease) ([place])   . Coronary artery disease    a. LHC 12/23/2017: pLAD to mLAD 95% s/p DES  . Depression   . DJD (degenerative joint disease)   . Fibromyalgia   . GERD (gastroesophageal reflux disease)   . History of hiatal hernia   . History of kidney stones   . Hyperlipidemia   . Hypertension   . Pneumonia   . Prediabetes   . Rheumatoid arthritis ([place])   . Sleep apnea    "home test indicated that I had it; I've never used mask" (12/23/2017)  . Tension headache    "controlled" (12/23/2017)    Past Surgical History:  Procedure Laterality Date  . BACK SURGERY    . CARDIAC CATHETERIZATION N/A 04/08/2015   Procedure: Left Heart  Cath and Coronary Angiography;  Surgeon: [name], MD;  Location: [place] [facility];  Service: Cardiovascular;  Laterality: N/A;  . CARDIAC CATHETERIZATION N/A 04/08/2015   Procedure: Intravascular Pressure Wire/FFR Study;  Surgeon: [name], MD;  Location: [place] [facility];  Service: Cardiovascular;  Laterality: N/A;  . CATARACT EXTRACTION W/ INTRAOCULAR LENS IMPLANT     "? side" (12/23/2017)  . CORONARY ANGIOPLASTY WITH STENT PLACEMENT  12/23/2017  . CORONARY STENT INTERVENTION N/A 12/23/2017   Procedure: CORONARY STENT INTERVENTION;  Surgeon: Martinique, [name], MD;  Location: [place] [facility];  Service: Cardiovascular;  Laterality: N/A;  . LEFT HEART CATH AND CORONARY ANGIOGRAPHY N/A 12/23/2017   Procedure: LEFT HEART CATH AND CORONARY ANGIOGRAPHY;  Surgeon: Martinique, [name], MD;  Location: [place] [facility];   Service: Cardiovascular;  Laterality: N/A;  . LUMBAR DISC SURGERY     "ruptured disc"    Social History   Socioeconomic History  . Marital status: Widowed    Spouse name: Not on file  . Number of children: 3  . Years of education: Not on file  . Highest education level: Not on file  Occupational History  . Not on file  Tobacco Use  . Smoking status: Current Every Day Smoker    Packs/day: 0.50  . Smokeless tobacco: Never Used  Vaping Use  . Vaping Use: Never used  Substance and Sexual Activity  . Alcohol use: Yes    Comment: 12/23/2017 "maybe 1 drink q 6 months"  . Drug use: Never  . Sexual activity: Not Currently  Other Topics Concern  . Not on file  Social History Narrative  . Not on file   Social Determinants of Health   Financial Resource Strain:   . Difficulty of Paying Living Expenses:   Food Insecurity:   . Worried About Charity fundraiser in the Last Year:   . Arboriculturist in the Last Year:   Transportation Needs:   . Film/video editor (Medical):   [name] Lack of Transportation (Non-Medical):   Physical Activity:   . Days of Exercise per Week:   . Minutes of Exercise per Session:   Stress:   . Feeling of Stress :   Social Connections:   . Frequency of Communication with Friends and Family:   . Frequency of Social Gatherings with Friends and Family:   . Attends Religious Services:   . Active Member of Clubs or Organizations:   . Attends Archivist Meetings:   [name] Marital Status:   Intimate Partner Violence:   . Fear of Current or Ex-Partner:   . Emotionally Abused:   [name] Physically Abused:   . Sexually Abused:     Family History  Problem Relation Age of Onset  . Heart failure Mother   . Heart disease Mother   . Diabetes Mother   . Hypertension Mother   . Emphysema Father   . Heart failure Father        CABG, AVR    ROS: no fevers or chills, productive cough, hemoptysis, dysphasia, odynophagia, melena, hematochezia, dysuria,  hematuria, rash, seizure activity, orthopnea, PND, pedal edema, claudication. Remaining systems are negative.  Physical Exam: Well-developed well-nourished in no acute distress.  Skin is warm and dry.  HEENT is normal.  Neck is supple.  Chest is clear to auscultation with normal expansion.  Cardiovascular exam is regular rate and rhythm.  Abdominal exam nontender or distended. No masses palpated. Extremities show  no edema. neuro grossly intact  ECG-sinus rhythm at a rate of 66, no ST changes.  Personally reviewed  A/P  1 coronary artery disease-Plan to continue medical therapy with aspirin; discontinue Plavix; intolerant to statins.  She is having occasional atypical chest pain unlike her previous angina.  Electrocardiogram shows no ST changes.  We will plan to risk stratify with a Lexiscan nuclear study.  She also describes dyspnea.  We will check echocardiogram for LV function.  2 hypertension-blood pressure controlled.  Continue present medications and follow.  3 hyperlipidemia-intolerant to statins.  Continue Repatha.  4 ectatic abdominal aorta-plan follow-up ultrasound July 2025.  5 tobacco abuse-patient discontinued December 2019.  6 chronic stage III kidney disease-patient is followed by nephrology.  Kirk Ruths, MD

## 2019-08-10 ENCOUNTER — Ambulatory Visit: Payer: Medicare Other | Admitting: Cardiology

## 2019-08-10 ENCOUNTER — Encounter: Payer: Self-pay | Admitting: Cardiology

## 2019-08-10 ENCOUNTER — Other Ambulatory Visit: Payer: Self-pay

## 2019-08-10 VITALS — BP 110/82 | HR 66 | Ht 64.0 in | Wt 207.0 lb

## 2019-08-10 DIAGNOSIS — E7800 Pure hypercholesterolemia, unspecified: Secondary | ICD-10-CM

## 2019-08-10 DIAGNOSIS — R072 Precordial pain: Secondary | ICD-10-CM

## 2019-08-10 DIAGNOSIS — G720 Drug-induced myopathy: Secondary | ICD-10-CM

## 2019-08-10 DIAGNOSIS — I10 Essential (primary) hypertension: Secondary | ICD-10-CM

## 2019-08-10 DIAGNOSIS — R0602 Shortness of breath: Secondary | ICD-10-CM

## 2019-08-10 DIAGNOSIS — I2510 Atherosclerotic heart disease of native coronary artery without angina pectoris: Secondary | ICD-10-CM

## 2019-08-10 DIAGNOSIS — T466X5A Adverse effect of antihyperlipidemic and antiarteriosclerotic drugs, initial encounter: Secondary | ICD-10-CM

## 2019-08-10 NOTE — Patient Instructions (Signed)
Medication Instructions:  Stop Plavix *If you need a refill on your cardiac medications before your next appointment, please call your pharmacy*   Lab Work: None Ordered If you have labs (blood work) drawn today and your tests are completely normal, you will receive your results only by: [name] MyChart Message (if you have MyChart) OR . A paper copy in the mail If you have any lab test that is abnormal or we need to change your treatment, we will call you to review the results.   Testing/Procedures: Your physician has requested that you have an echocardiogram. Echocardiography is a painless test that uses sound waves to create images of your heart. It provides your doctor with information about the size and shape of your heart and how well your heart's chambers and valves are working. You may receive an ultrasound enhancing agent through an IV if needed to better visualize your heart during the echo.This procedure takes approximately one hour. There are no restrictions for this procedure. This will take place at the [address], [address].   Your physician has requested that you have a lexiscan myoview. For further information please visit [email or web address]. Please follow instruction sheet, as given. This will be done at the [address] [employer].    Follow-Up: At [facility], you and your health needs are our priority.  As part of our continuing mission to provide you with exceptional heart care, we have created designated Provider Care Teams.  These Care Teams include your primary Cardiologist (physician) and Advanced Practice Providers (APPs -  Physician Assistants and Nurse Practitioners) who all work together to provide you with the care you need, when you need it.  We recommend signing up for the patient portal called "MyChart".  Sign up information is provided on this After Visit Summary.  MyChart is used to connect with patients for Virtual Visits (Telemedicine).   Patients are able to view lab/test results, encounter notes, upcoming appointments, etc.  Non-urgent messages can be sent to your provider as well.   To learn more about what you can do with MyChart, go to [email or web address].    Your next appointment:   6 month(s)  The format for your next appointment:   In Person  Provider:   Kirk Ruths, MD   Other Instructions

## 2019-08-12 ENCOUNTER — Encounter (HOSPITAL_COMMUNITY): Payer: Medicare Other

## 2019-08-26 ENCOUNTER — Telehealth (HOSPITAL_COMMUNITY): Payer: Self-pay | Admitting: *Deleted

## 2019-08-26 NOTE — Telephone Encounter (Signed)
Patient given detailed instructions per Myocardial Perfusion Study Information Sheet for the test on 08/28/19 at 10:30. Patient notified to arrive 15 minutes early and that it is imperative to arrive on time for appointment to keep from having the test rescheduled.  If you need to cancel or reschedule your appointment, please call the office within 24 hours of your appointment. . Patient verbalized understanding.Veronia Beets

## 2019-08-28 ENCOUNTER — Other Ambulatory Visit: Payer: Self-pay

## 2019-08-28 ENCOUNTER — Ambulatory Visit (HOSPITAL_COMMUNITY): Payer: Medicare Other | Attending: Cardiology

## 2019-08-28 ENCOUNTER — Ambulatory Visit (HOSPITAL_BASED_OUTPATIENT_CLINIC_OR_DEPARTMENT_OTHER): Payer: Medicare Other

## 2019-08-28 DIAGNOSIS — R072 Precordial pain: Secondary | ICD-10-CM | POA: Diagnosis not present

## 2019-08-28 DIAGNOSIS — R0602 Shortness of breath: Secondary | ICD-10-CM | POA: Diagnosis not present

## 2019-08-28 LAB — MYOCARDIAL PERFUSION IMAGING
LV dias vol: 62 mL (ref 46–106)
LV sys vol: 19 mL
Peak HR: 76 {beats}/min
Rest HR: 53 {beats}/min
SDS: 1
SRS: 0
SSS: 1
TID: 1.02

## 2019-08-28 LAB — ECHOCARDIOGRAM COMPLETE
Area-P 1/2: 3.17 cm2
S' Lateral: 2.9 cm

## 2019-08-28 MED ORDER — TECHNETIUM TC 99M TETROFOSMIN IV KIT
32.0000 | PACK | Freq: Once | INTRAVENOUS | Status: AC | PRN
  Administered 2019-08-28: 32 via INTRAVENOUS
  Filled 2019-08-28: qty 32

## 2019-08-28 MED ORDER — TECHNETIUM TC 99M TETROFOSMIN IV KIT
11.0000 | PACK | Freq: Once | INTRAVENOUS | Status: AC | PRN
  Administered 2019-08-28: 11 via INTRAVENOUS
  Filled 2019-08-28: qty 11

## 2019-08-28 MED ORDER — REGADENOSON 0.4 MG/5ML IV SOLN
0.4000 mg | Freq: Once | INTRAVENOUS | Status: AC
  Administered 2019-08-28: 0.4 mg via INTRAVENOUS

## 2019-09-02 ENCOUNTER — Other Ambulatory Visit: Payer: Self-pay

## 2019-09-02 ENCOUNTER — Other Ambulatory Visit: Payer: Medicare Other

## 2019-09-02 DIAGNOSIS — Z20822 Contact with and (suspected) exposure to covid-19: Secondary | ICD-10-CM

## 2019-09-03 LAB — NOVEL CORONAVIRUS, NAA: SARS-CoV-2, NAA: NOT DETECTED

## 2019-09-03 LAB — SARS-COV-2, NAA 2 DAY TAT

## 2019-09-11 DIAGNOSIS — Z79899 Other long term (current) drug therapy: Secondary | ICD-10-CM | POA: Diagnosis not present

## 2019-10-26 DIAGNOSIS — E7800 Pure hypercholesterolemia, unspecified: Secondary | ICD-10-CM | POA: Diagnosis not present

## 2019-10-26 DIAGNOSIS — I2510 Atherosclerotic heart disease of native coronary artery without angina pectoris: Secondary | ICD-10-CM | POA: Diagnosis not present

## 2019-10-26 DIAGNOSIS — E559 Vitamin D deficiency, unspecified: Secondary | ICD-10-CM | POA: Diagnosis not present

## 2019-10-28 DIAGNOSIS — G720 Drug-induced myopathy: Secondary | ICD-10-CM | POA: Insufficient documentation

## 2019-10-28 DIAGNOSIS — T466X5A Adverse effect of antihyperlipidemic and antiarteriosclerotic drugs, initial encounter: Secondary | ICD-10-CM | POA: Insufficient documentation

## 2019-11-02 DIAGNOSIS — I10 Essential (primary) hypertension: Secondary | ICD-10-CM | POA: Diagnosis not present

## 2019-11-02 DIAGNOSIS — R7303 Prediabetes: Secondary | ICD-10-CM | POA: Diagnosis not present

## 2019-11-02 DIAGNOSIS — Z23 Encounter for immunization: Secondary | ICD-10-CM | POA: Diagnosis not present

## 2019-11-02 DIAGNOSIS — E7800 Pure hypercholesterolemia, unspecified: Secondary | ICD-10-CM | POA: Diagnosis not present

## 2019-11-02 DIAGNOSIS — E559 Vitamin D deficiency, unspecified: Secondary | ICD-10-CM | POA: Diagnosis not present

## 2019-11-24 ENCOUNTER — Other Ambulatory Visit: Payer: Self-pay

## 2019-11-24 ENCOUNTER — Other Ambulatory Visit: Payer: Self-pay | Admitting: Registered Nurse

## 2019-11-24 ENCOUNTER — Ambulatory Visit
Admission: RE | Admit: 2019-11-24 | Discharge: 2019-11-24 | Disposition: A | Discharge status: Home / Self Care | Payer: Medicare Other | Source: Ambulatory Visit | Attending: Registered Nurse | Admitting: Registered Nurse

## 2019-11-24 DIAGNOSIS — Z1231 Encounter for screening mammogram for malignant neoplasm of breast: Secondary | ICD-10-CM

## 2019-12-17 DIAGNOSIS — N261 Atrophy of kidney (terminal): Secondary | ICD-10-CM | POA: Diagnosis not present

## 2019-12-17 DIAGNOSIS — N184 Chronic kidney disease, stage 4 (severe): Secondary | ICD-10-CM | POA: Diagnosis not present

## 2019-12-17 DIAGNOSIS — I129 Hypertensive chronic kidney disease with stage 1 through stage 4 chronic kidney disease, or unspecified chronic kidney disease: Secondary | ICD-10-CM | POA: Diagnosis not present

## 2019-12-26 ENCOUNTER — Other Ambulatory Visit: Payer: Self-pay | Admitting: Adult Health

## 2019-12-30 DIAGNOSIS — L720 Epidermal cyst: Secondary | ICD-10-CM | POA: Diagnosis not present

## 2019-12-30 DIAGNOSIS — D692 Other nonthrombocytopenic purpura: Secondary | ICD-10-CM | POA: Diagnosis not present

## 2019-12-30 DIAGNOSIS — L821 Other seborrheic keratosis: Secondary | ICD-10-CM | POA: Diagnosis not present

## 2019-12-30 DIAGNOSIS — L918 Other hypertrophic disorders of the skin: Secondary | ICD-10-CM | POA: Diagnosis not present

## 2019-12-30 DIAGNOSIS — D1801 Hemangioma of skin and subcutaneous tissue: Secondary | ICD-10-CM | POA: Diagnosis not present

## 2019-12-31 DIAGNOSIS — R059 Cough, unspecified: Secondary | ICD-10-CM | POA: Diagnosis not present

## 2020-02-24 NOTE — Progress Notes (Signed)
Cardiology Office Note   Date:  02/26/2020   ID:  Punam, Broussard Jun 07, 1950, MRN [id number]  PCP:  Holland Commons, FNP  Cardiologist:  Dr. Stanford Breed  No chief complaint on file.    History of Present Illness: Janet Johnston is a 70 y.o. female who presents for ongoing assessment and management of coronary artery disease, with most recent cardiac catheterization in December 2019.  Catheterization revealed a 95% proximal to mid LAD which was treated with drug-eluting stent.  Her ejection fraction at that time was 55 to 65%.  She also had an abdominal ultrasound in July 2020 which showed an ectatic abdominal aorta with a follow-up ultrasound recommended in 2025.  She was last seen by Dr. Stanford Breed on 08/10/2019 and did complain of some dyspnea on exertion and occasional chest pain not related to activities but unlike her previous pain which occurred prior to intervention.  She was continued on medical therapy with aspirin, Plavix was discontinued.  Dr. Stanford Breed noted that she was intolerant to statins.  He felt her chest pain was noncardiac in etiology.  EKG was completed which did not show any ST changes.  However, Dr. Stanford Breed ordered a [place] nuclear study because of her complaints of dyspnea.  He also repeated her echocardiogram.  Echocardiogram dated 08/28/2019 revealed a normal LV systolic function of 55 to 60%.  She was found to have grade 1 diastolic dysfunction.  Mild aortic valve sclerosis was present without evidence of stenosis.  Nuclear stress test on 08/28/2019 was a low risk study with no evidence of ischemia, normal perfusion was seen.  She is doing well today.  She continues to have issues with fibromyalgia, arthritis, and personal family issues.  She is in good spirits however.  She has been seen by her PCP and has had labs planned.  She is medically compliant.  Past Medical History:  Diagnosis Date  . Anxiety   . Asthmatic bronchitis   . Chronic bronchitis ([place])     "get it q yr; we head w/wood stove" (12/23/2017)  . Chronic kidney disease    "Creatinine level elevated last couple years" (12/23/2017)  . Chronic lower back pain   . COPD (chronic obstructive pulmonary disease) ([place])   . Coronary artery disease    a. LHC 12/23/2017: pLAD to mLAD 95% s/p DES  . Depression   . DJD (degenerative joint disease)   . Fibromyalgia   . GERD (gastroesophageal reflux disease)   . History of hiatal hernia   . History of kidney stones   . Hyperlipidemia   . Hypertension   . Pneumonia   . Prediabetes   . Rheumatoid arthritis ([place])   . Sleep apnea    "home test indicated that I had it; I've never used mask" (12/23/2017)  . Tension headache    "controlled" (12/23/2017)    Past Surgical History:  Procedure Laterality Date  . BACK SURGERY    . CARDIAC CATHETERIZATION N/A 04/08/2015   Procedure: Left Heart Cath and Coronary Angiography;  Surgeon: [name], MD;  Location: [facility];  Service: Cardiovascular;  Laterality: N/A;  . CARDIAC CATHETERIZATION N/A 04/08/2015   Procedure: Intravascular Pressure Wire/FFR Study;  Surgeon: [name], MD;  Location: [facility];  Service: Cardiovascular;  Laterality: N/A;  . CATARACT EXTRACTION W/ INTRAOCULAR LENS IMPLANT     "? side" (12/23/2017)  . CORONARY ANGIOPLASTY WITH STENT PLACEMENT  12/23/2017  . CORONARY STENT INTERVENTION N/A 12/23/2017  Procedure: CORONARY STENT INTERVENTION;  Surgeon: Martinique, [name], MD;  Location: [facility];  Service: Cardiovascular;  Laterality: N/A;  . LEFT HEART CATH AND CORONARY ANGIOGRAPHY N/A 12/23/2017   Procedure: LEFT HEART CATH AND CORONARY ANGIOGRAPHY;  Surgeon: Martinique, [name], MD;  Location: [facility];  Service: Cardiovascular;  Laterality: N/A;  . LUMBAR [place]     "ruptured disc"     Current Outpatient Medications  Medication Sig Dispense Refill  . amLODipine (NORVASC) 5 MG tablet TAKE 1/2 TABLET BY MOUTH DAILY. 45  tablet 2  . Ascorbic Acid (VITAMIN C) 1000 MG tablet 1 tablet    . aspirin EC 81 MG tablet Take 1 tablet (81 mg total) by mouth daily. 90 tablet 3  . bisoprolol (ZEBETA) 5 MG tablet Take 5 mg by mouth at bedtime.    . clonazePAM (KLONOPIN) 0.5 MG tablet Take 0.25-0.5 mg by mouth daily as needed for anxiety.    . Coenzyme Q10 (CO Q 10) 100 MG CAPS Take 100 mg by mouth daily.     . ergocalciferol (VITAMIN D2) 50000 UNITS capsule Take 50,000 Units by mouth once a week.     [name] FLUoxetine (PROZAC) 40 MG capsule Take 40 mg by mouth daily.    . hydroxychloroquine (PLAQUENIL) 200 MG tablet Take 200 mg by mouth daily.     [name] losartan (COZAAR) 50 MG tablet Take 50 mg by mouth daily.    . Magnesium 500 MG TABS Take 500 mg by mouth daily.    . Multiple Vitamins-Minerals (CENTRUM ADULTS PO) Take 1 tablet by mouth daily.    . nitroGLYCERIN (NITROSTAT) 0.4 MG SL tablet Place 1 tablet (0.4 mg total) under the tongue every 5 (five) minutes as needed for chest pain. 30 tablet 2  . omeprazole (PRILOSEC) 40 MG capsule Take 40 mg by mouth daily. Takes prn    . predniSONE (DELTASONE) 5 MG tablet Take 5 mg by mouth daily with breakfast.    . REPATHA SURECLICK 616 MG/ML SOAJ Inject 1 Syringe into the skin every 14 (fourteen) days. (Patient not taking: Reported on 02/26/2020)     No current facility-administered medications for this visit.    Allergies:   Atorvastatin, Iodinated diagnostic agents, Rosuvastatin, Zetia [ezetimibe], Lyrica [pregabalin], Penicillins, and Sulfa antibiotics    Social History:  The patient  reports that she has been smoking. She has been smoking about 0.50 packs per day. She has never used smokeless tobacco. She reports current alcohol use. She reports that she does not use drugs.   Family History:  The patient's family history includes Diabetes in her mother; Emphysema in her father; Heart disease in her mother; Heart failure in her father and mother; Hypertension in her mother.    ROS:  All other systems are reviewed and negative. Unless otherwise mentioned in H&P    PHYSICAL EXAM: VS:  BP 110/72   Pulse 69   Ht 5\' 4"  (1.626 m)   Wt 187 lb 9.6 oz (85.1 kg)   SpO2 97%   BMI 32.20 kg/m  , BMI Body mass index is 32.2 kg/m. GEN: Well nourished, well developed, in no acute distress HEENT: normal Neck: no JVD, carotid bruits, or masses Cardiac: RRR; no murmurs, rubs, or gallops,no edema  Respiratory:  Clear to auscultation bilaterally, normal work of breathing GI: soft, nontender, nondistended, + BS MS: no deformity or atrophy Skin: warm and dry, no rash Neuro:  Strength and sensation are intact Psych: euthymic mood,  full affect   EKG: Normal sinus rhythm, rate of 69 bpm, (personally reviewed)  Recent Labs: No results found for requested labs within last 8760 hours.    Lipid Panel    Component Value Date/Time   CHOL 224 (H) 05/30/2016 1256   TRIG 167 (H) 05/30/2016 1256   HDL 54 05/30/2016 1256   CHOLHDL 4.1 05/30/2016 1256   CHOLHDL 3.7 04/06/2015 1544   VLDL 33 (H) 04/06/2015 1544   LDLCALC 137 (H) 05/30/2016 1256      Wt Readings from Last 3 Encounters:  02/26/20 187 lb 9.6 oz (85.1 kg)  08/28/19 207 lb (93.9 kg)  08/10/19 (!) 207 lb (93.9 kg)      Other studies Reviewed:   Study Highlights    Nuclear stress EF: 69%.  There was no ST segment deviation noted during stress.  No T wave inversion was noted during stress.  This is a low risk study.   Normal perfusion. LVEF 69% with normal wall motion. This is a low risk study.  Echocardiogram 08/28/2019 1. Left ventricular ejection fraction, by estimation, is 55 to 60%. The  left ventricle has normal function. The left ventricle has no regional  wall motion abnormalities. Left ventricular diastolic parameters are  consistent with Grade I diastolic  dysfunction (impaired relaxation).  2. Right ventricular systolic function is normal. The right ventricular  size is normal. There is  normal pulmonary artery systolic pressure.  3. The mitral valve is normal in structure. Trivial mitral valve  regurgitation. No evidence of mitral stenosis.  4. The aortic valve is tricuspid. Aortic valve regurgitation is not  visualized. Mild aortic valve sclerosis is present, with no evidence of  aortic valve stenosis.  5. The inferior vena cava is normal in size with greater than 50%  respiratory variability, suggesting right atrial pressure of 3 mmHg.    ASSESSMENT AND PLAN:  1.  Coronary artery disease: She is doing well and without cardiac complaints.  Remains busy, denies chest pain dyspnea on exertion, or extensive fatigue.  She is medically compliant.  We will make no changes on her medication regimen at this time.  2.  Hypertension: Blood pressures well controlled on current medication regimen.  Will not draw labs but she will be seeing her PCP soon.  Continue losartan 50 mg daily and bisoprolol 5 mg daily along with amlodipine 5 mg daily.  3.  Hyperlipidemia: She has run out of Repatha refills.  She has to go through another eligibility requirement and fill out more paperwork.  I have advised her if she needs help with this to contact her office or one of our pharmacists in the office to help her.  She recently moved from 1 home to another and did not fill out the new paperwork.  I explained to her the importance of taking Repatha to keep her LDL less than 70 with known CAD.  She verbalizes understanding  Current medicines are reviewed at length with the patient today.  I have spent 25  mins dedicated to the care of this patient on the date of this encounter to include pre-visit review of records, assessment, management and diagnostic testing,with shared decision making.  Labs/ tests ordered today include: None on this visit. Phill Myron. West Pugh, ANP, AACC   02/26/2020 1:31 PM    [place] [address] Office [phone] Fax [phone]  Notice: This dictation was prepared with Dragon dictation along with smaller phrase technology. Any  transcriptional errors that result from this process are unintentional and may not be corrected upon review.

## 2020-02-26 ENCOUNTER — Ambulatory Visit: Payer: Medicare Other | Admitting: Adult Health

## 2020-02-26 ENCOUNTER — Encounter: Payer: Self-pay | Admitting: Adult Health

## 2020-02-26 ENCOUNTER — Other Ambulatory Visit: Payer: Self-pay

## 2020-02-26 VITALS — BP 110/72 | HR 69 | Ht 64.0 in | Wt 187.6 lb

## 2020-02-26 DIAGNOSIS — I10 Essential (primary) hypertension: Secondary | ICD-10-CM

## 2020-02-26 DIAGNOSIS — E7800 Pure hypercholesterolemia, unspecified: Secondary | ICD-10-CM

## 2020-02-26 DIAGNOSIS — I2510 Atherosclerotic heart disease of native coronary artery without angina pectoris: Secondary | ICD-10-CM | POA: Diagnosis not present

## 2020-02-26 NOTE — Patient Instructions (Signed)
Medication Instructions:  Your physician recommends that you continue on your current medications as directed. Please refer to the Current Medication list given to you today.  *If you need a refill on your cardiac medications before your next appointment, please call your pharmacy*  Lab Work: NONE ordered at this time of appointment   If you have labs (blood work) drawn today and your tests are completely normal, you will receive your results only by: MyChart Message (if you have MyChart) OR A paper copy in the mail If you have any lab test that is abnormal or we need to change your treatment, we will call you to review the results.  Testing/Procedures: NONE ordered at this time of appointment   Follow-Up: At [facility], you and your health needs are our priority.  As part of our continuing mission to provide you with exceptional heart care, we have created designated Provider Care Teams.  These Care Teams include your primary Cardiologist (physician) and Advanced Practice Providers (APPs -  Physician Assistants and Nurse Practitioners) who all work together to provide you with the care you need, when you need it.  Your next appointment:   6 month(s)  The format for your next appointment:   In Person  Provider:   Brian Crenshaw, MD   Other Instructions   

## 2020-03-26 ENCOUNTER — Other Ambulatory Visit: Payer: Self-pay | Admitting: Adult Health

## 2020-04-06 IMAGING — MG DIGITAL SCREENING BILATERAL MAMMOGRAM WITH TOMO AND CAD
8 series · 9 of 24 positions shown · non-contrast
Comparison: Previous exam(s).

CLINICAL DATA: Screening.

EXAM:
DIGITAL SCREENING BILATERAL MAMMOGRAM WITH TOMO AND CAD

[R MLO synth-2D]
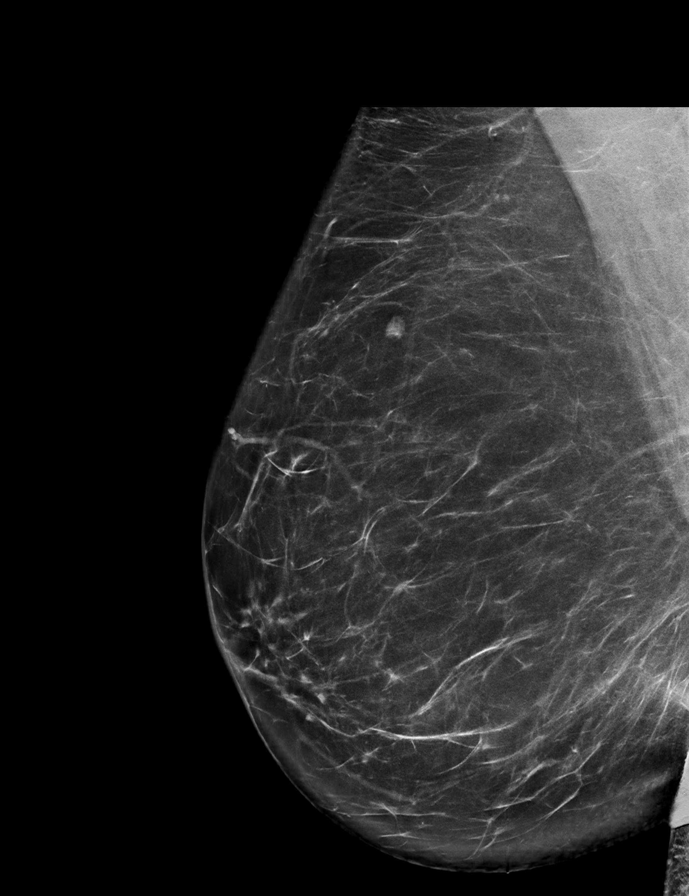

[L MLO synth-2D]
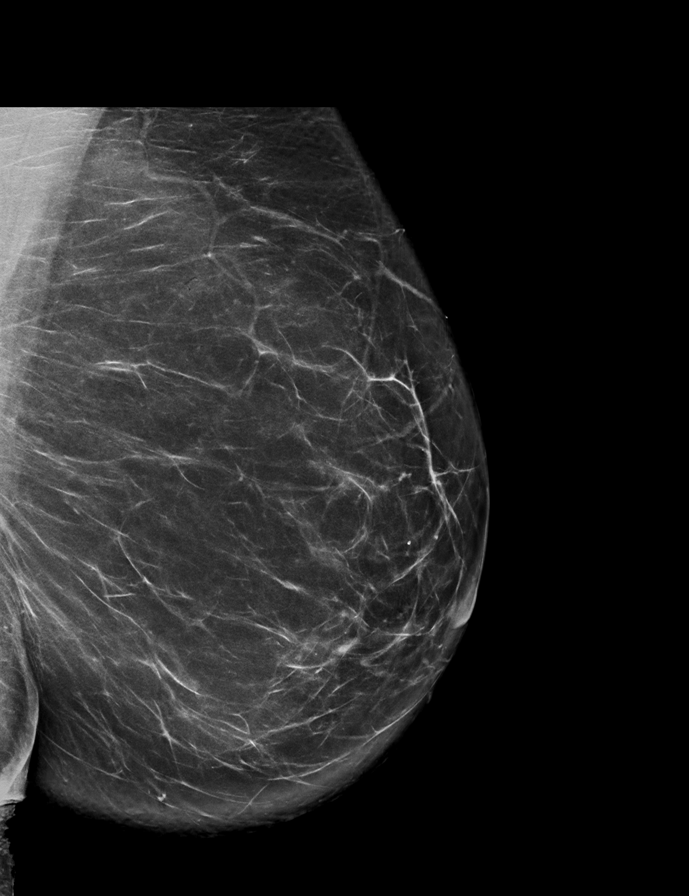

[R CC synth-2D]
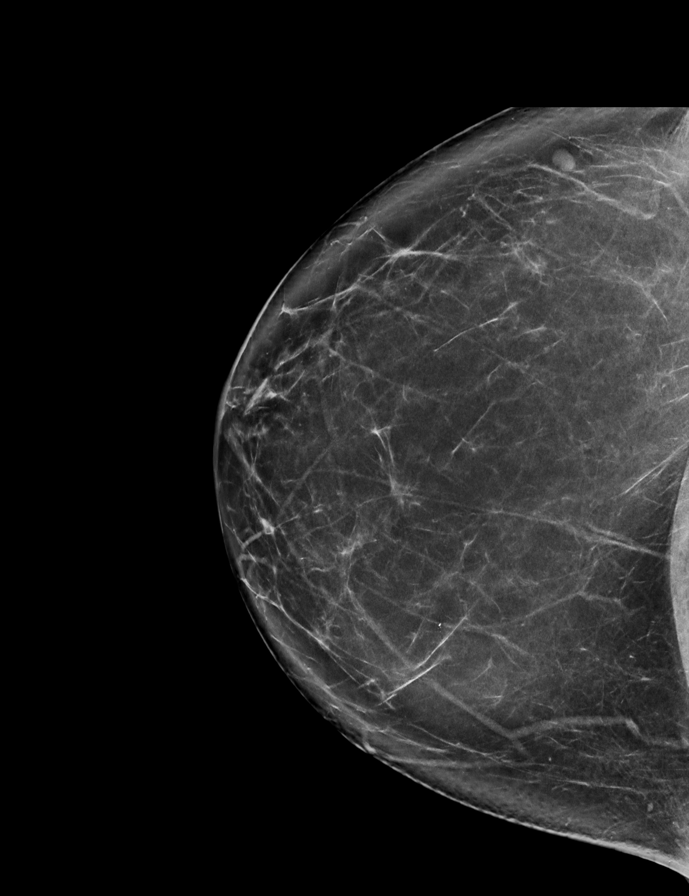

[L CC synth-2D]
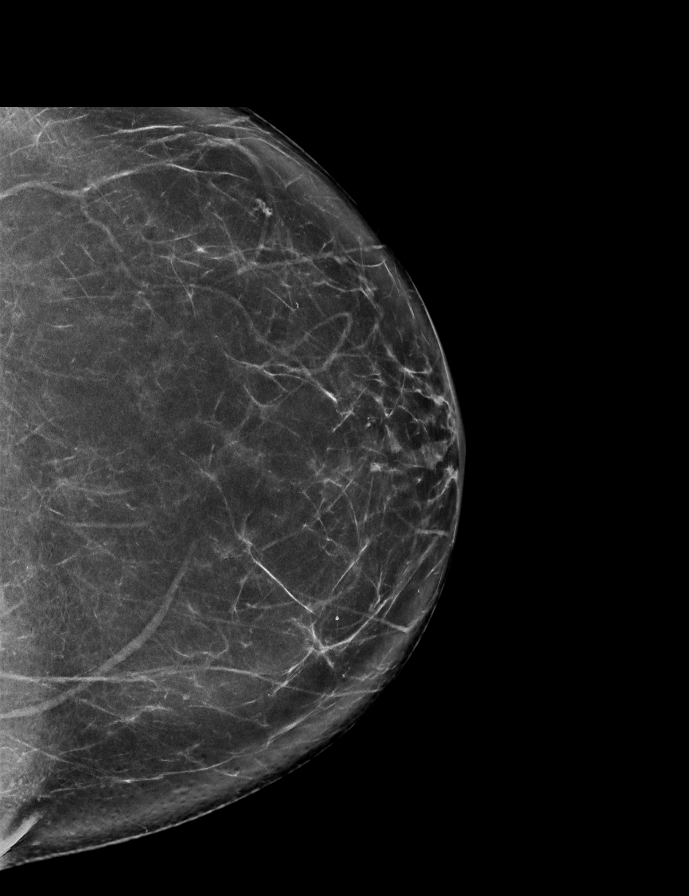

[L MLO tomo · 2 of 90 frames shown]
[frame 29/90]
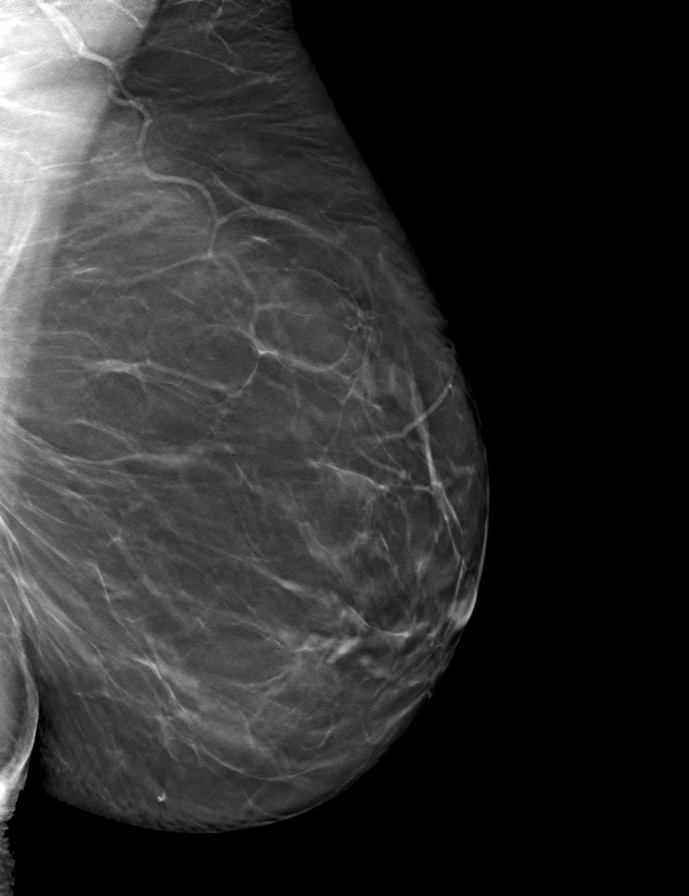
[frame 45/90]
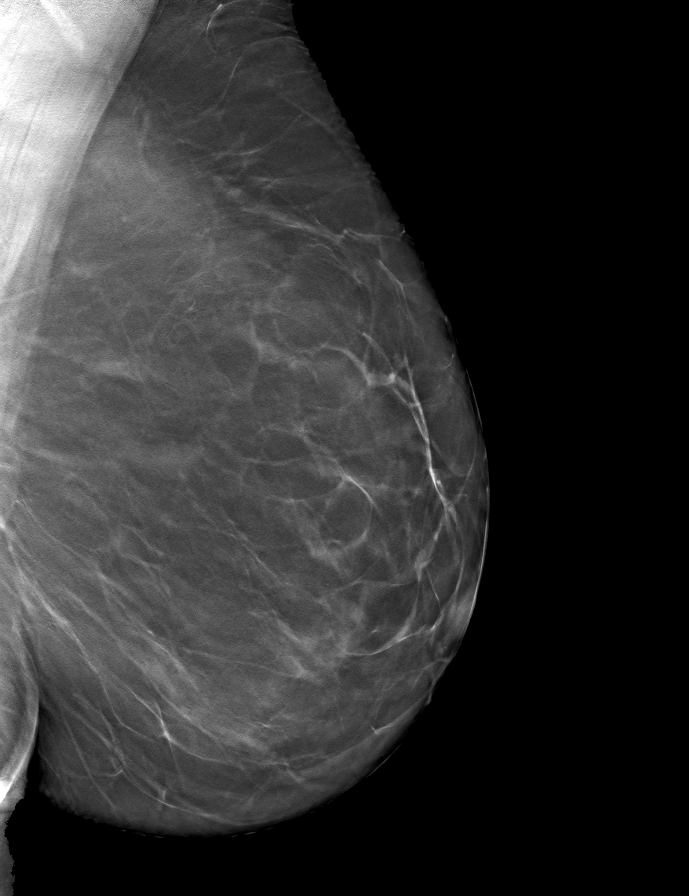

[R CC tomo · tomo slice 43/85.0]
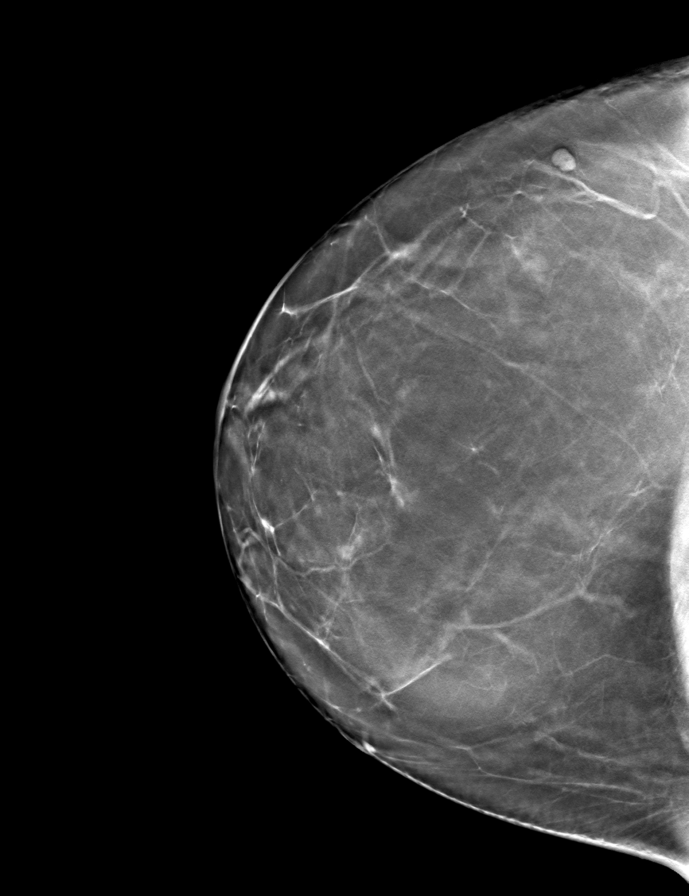

[L CC tomo · tomo slice 43/85.0]
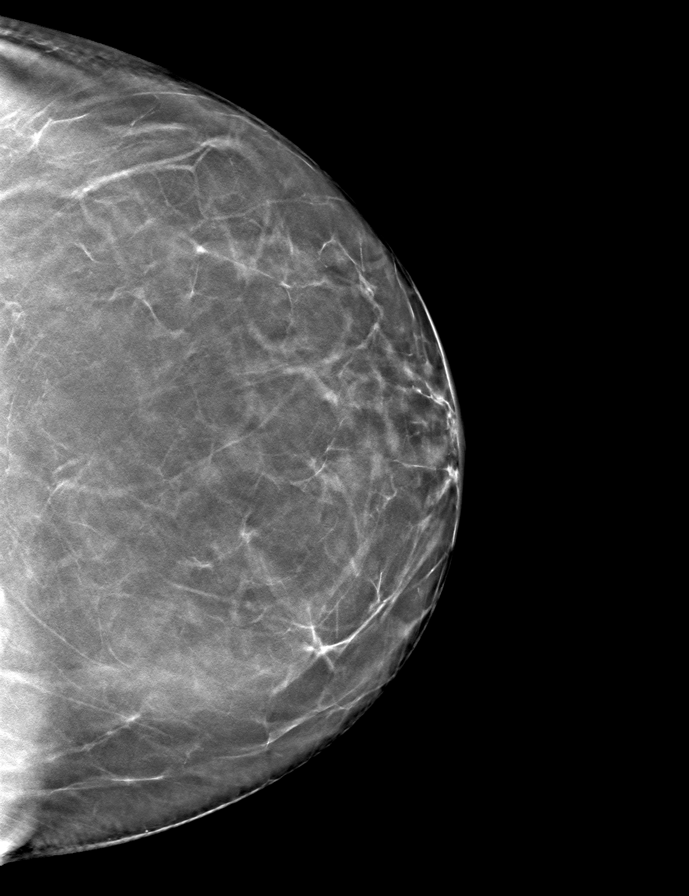

[R MLO tomo · tomo slice 45/90.0]
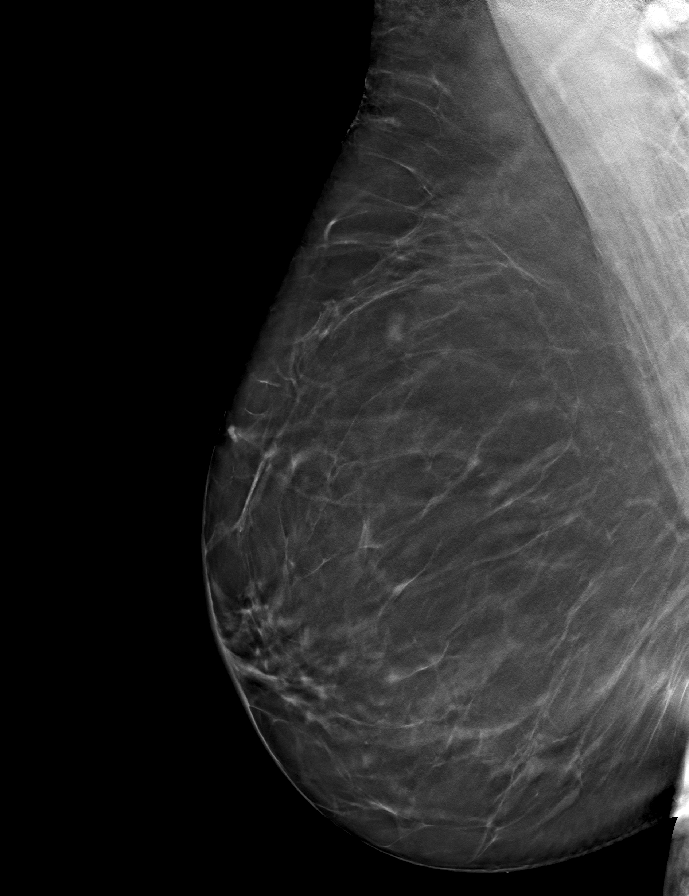

[9 of 24 positions shown; findings below may reference images not displayed]

ACR Breast Density Category b: There are scattered areas of
fibroglandular density.
FINDINGS: There are no findings suspicious for malignancy. Images were
processed with CAD.
IMPRESSION: No mammographic evidence of malignancy. A result letter of this
screening mammogram will be mailed directly to the patient.

RECOMMENDATION:
Screening mammogram in one year. (Code:[id number])

BI-RADS CATEGORY  1: Negative.

## 2020-04-25 DIAGNOSIS — N184 Chronic kidney disease, stage 4 (severe): Secondary | ICD-10-CM | POA: Diagnosis not present

## 2020-04-25 DIAGNOSIS — I2510 Atherosclerotic heart disease of native coronary artery without angina pectoris: Secondary | ICD-10-CM | POA: Diagnosis not present

## 2020-04-25 DIAGNOSIS — Z0000 Encounter for general adult medical examination without abnormal findings: Secondary | ICD-10-CM | POA: Diagnosis not present

## 2020-04-25 DIAGNOSIS — E7800 Pure hypercholesterolemia, unspecified: Secondary | ICD-10-CM | POA: Diagnosis not present

## 2020-05-02 DIAGNOSIS — F17200 Nicotine dependence, unspecified, uncomplicated: Secondary | ICD-10-CM | POA: Diagnosis not present

## 2020-05-02 DIAGNOSIS — I2510 Atherosclerotic heart disease of native coronary artery without angina pectoris: Secondary | ICD-10-CM | POA: Diagnosis not present

## 2020-05-02 DIAGNOSIS — I10 Essential (primary) hypertension: Secondary | ICD-10-CM | POA: Diagnosis not present

## 2020-05-02 DIAGNOSIS — R7303 Prediabetes: Secondary | ICD-10-CM | POA: Diagnosis not present

## 2020-05-02 DIAGNOSIS — E559 Vitamin D deficiency, unspecified: Secondary | ICD-10-CM | POA: Diagnosis not present

## 2020-05-02 DIAGNOSIS — Z0000 Encounter for general adult medical examination without abnormal findings: Secondary | ICD-10-CM | POA: Diagnosis not present

## 2020-05-02 DIAGNOSIS — N184 Chronic kidney disease, stage 4 (severe): Secondary | ICD-10-CM | POA: Diagnosis not present

## 2020-05-02 DIAGNOSIS — E7800 Pure hypercholesterolemia, unspecified: Secondary | ICD-10-CM | POA: Diagnosis not present

## 2020-05-03 ENCOUNTER — Other Ambulatory Visit: Payer: Self-pay | Admitting: Family Medicine

## 2020-05-03 DIAGNOSIS — F17200 Nicotine dependence, unspecified, uncomplicated: Secondary | ICD-10-CM

## 2020-05-04 DIAGNOSIS — M797 Fibromyalgia: Secondary | ICD-10-CM | POA: Diagnosis not present

## 2020-05-04 DIAGNOSIS — M5136 Other intervertebral disc degeneration, lumbar region: Secondary | ICD-10-CM | POA: Diagnosis not present

## 2020-05-04 DIAGNOSIS — M150 Primary generalized (osteo)arthritis: Secondary | ICD-10-CM | POA: Diagnosis not present

## 2020-05-04 DIAGNOSIS — N1832 Chronic kidney disease, stage 3b: Secondary | ICD-10-CM | POA: Diagnosis not present

## 2020-05-04 DIAGNOSIS — M2550 Pain in unspecified joint: Secondary | ICD-10-CM | POA: Diagnosis not present

## 2020-05-04 DIAGNOSIS — M0579 Rheumatoid arthritis with rheumatoid factor of multiple sites without organ or systems involvement: Secondary | ICD-10-CM | POA: Diagnosis not present

## 2020-05-10 DIAGNOSIS — H43812 Vitreous degeneration, left eye: Secondary | ICD-10-CM | POA: Diagnosis not present

## 2020-05-13 ENCOUNTER — Ambulatory Visit
Admission: RE | Admit: 2020-05-13 | Discharge: 2020-05-13 | Disposition: A | Discharge status: Home / Self Care | Payer: Medicare Other | Source: Ambulatory Visit | Attending: Family Medicine | Admitting: Family Medicine

## 2020-05-13 ENCOUNTER — Other Ambulatory Visit: Payer: Self-pay

## 2020-05-13 DIAGNOSIS — Z87891 Personal history of nicotine dependence: Secondary | ICD-10-CM | POA: Diagnosis not present

## 2020-05-13 DIAGNOSIS — F17200 Nicotine dependence, unspecified, uncomplicated: Secondary | ICD-10-CM

## 2020-06-28 DIAGNOSIS — I129 Hypertensive chronic kidney disease with stage 1 through stage 4 chronic kidney disease, or unspecified chronic kidney disease: Secondary | ICD-10-CM | POA: Diagnosis not present

## 2020-06-28 DIAGNOSIS — N261 Atrophy of kidney (terminal): Secondary | ICD-10-CM | POA: Diagnosis not present

## 2020-06-28 DIAGNOSIS — N184 Chronic kidney disease, stage 4 (severe): Secondary | ICD-10-CM | POA: Diagnosis not present

## 2020-09-20 ENCOUNTER — Other Ambulatory Visit: Payer: Self-pay | Admitting: Cardiology

## 2020-09-22 DIAGNOSIS — H2512 Age-related nuclear cataract, left eye: Secondary | ICD-10-CM | POA: Diagnosis not present

## 2020-09-22 DIAGNOSIS — H5201 Hypermetropia, right eye: Secondary | ICD-10-CM | POA: Diagnosis not present

## 2020-09-22 DIAGNOSIS — H04123 Dry eye syndrome of bilateral lacrimal glands: Secondary | ICD-10-CM | POA: Diagnosis not present

## 2020-09-22 DIAGNOSIS — Z79899 Other long term (current) drug therapy: Secondary | ICD-10-CM | POA: Diagnosis not present

## 2020-10-24 ENCOUNTER — Other Ambulatory Visit: Payer: Self-pay | Admitting: Registered Nurse

## 2020-10-24 DIAGNOSIS — Z1231 Encounter for screening mammogram for malignant neoplasm of breast: Secondary | ICD-10-CM

## 2020-10-27 DIAGNOSIS — R7303 Prediabetes: Secondary | ICD-10-CM | POA: Diagnosis not present

## 2020-10-27 DIAGNOSIS — H2512 Age-related nuclear cataract, left eye: Secondary | ICD-10-CM | POA: Diagnosis not present

## 2020-10-27 DIAGNOSIS — E7800 Pure hypercholesterolemia, unspecified: Secondary | ICD-10-CM | POA: Diagnosis not present

## 2020-10-28 DIAGNOSIS — E875 Hyperkalemia: Secondary | ICD-10-CM | POA: Diagnosis not present

## 2020-11-09 DIAGNOSIS — M1712 Unilateral primary osteoarthritis, left knee: Secondary | ICD-10-CM | POA: Diagnosis not present

## 2020-11-09 DIAGNOSIS — E7800 Pure hypercholesterolemia, unspecified: Secondary | ICD-10-CM | POA: Diagnosis not present

## 2020-11-09 DIAGNOSIS — N261 Atrophy of kidney (terminal): Secondary | ICD-10-CM | POA: Diagnosis not present

## 2020-11-09 DIAGNOSIS — I2510 Atherosclerotic heart disease of native coronary artery without angina pectoris: Secondary | ICD-10-CM | POA: Diagnosis not present

## 2020-11-09 DIAGNOSIS — Z23 Encounter for immunization: Secondary | ICD-10-CM | POA: Diagnosis not present

## 2020-11-09 DIAGNOSIS — N184 Chronic kidney disease, stage 4 (severe): Secondary | ICD-10-CM | POA: Diagnosis not present

## 2020-11-09 DIAGNOSIS — I10 Essential (primary) hypertension: Secondary | ICD-10-CM | POA: Diagnosis not present

## 2020-11-17 DIAGNOSIS — N1832 Chronic kidney disease, stage 3b: Secondary | ICD-10-CM | POA: Diagnosis not present

## 2020-11-17 DIAGNOSIS — M797 Fibromyalgia: Secondary | ICD-10-CM | POA: Diagnosis not present

## 2020-11-17 DIAGNOSIS — M5136 Other intervertebral disc degeneration, lumbar region: Secondary | ICD-10-CM | POA: Diagnosis not present

## 2020-11-17 DIAGNOSIS — M0579 Rheumatoid arthritis with rheumatoid factor of multiple sites without organ or systems involvement: Secondary | ICD-10-CM | POA: Diagnosis not present

## 2020-11-17 DIAGNOSIS — M150 Primary generalized (osteo)arthritis: Secondary | ICD-10-CM | POA: Diagnosis not present

## 2020-11-17 DIAGNOSIS — M2550 Pain in unspecified joint: Secondary | ICD-10-CM | POA: Diagnosis not present

## 2020-11-24 ENCOUNTER — Ambulatory Visit
Admission: RE | Admit: 2020-11-24 | Discharge: 2020-11-24 | Disposition: A | Discharge status: Home / Self Care | Payer: Medicare Other | Source: Ambulatory Visit | Attending: Registered Nurse | Admitting: Registered Nurse

## 2020-11-24 DIAGNOSIS — Z1231 Encounter for screening mammogram for malignant neoplasm of breast: Secondary | ICD-10-CM | POA: Diagnosis not present

## 2020-12-07 DIAGNOSIS — H2512 Age-related nuclear cataract, left eye: Secondary | ICD-10-CM | POA: Diagnosis not present

## 2020-12-07 DIAGNOSIS — H21562 Pupillary abnormality, left eye: Secondary | ICD-10-CM | POA: Diagnosis not present

## 2020-12-19 DIAGNOSIS — M1712 Unilateral primary osteoarthritis, left knee: Secondary | ICD-10-CM | POA: Diagnosis not present

## 2020-12-23 DIAGNOSIS — N184 Chronic kidney disease, stage 4 (severe): Secondary | ICD-10-CM | POA: Diagnosis not present

## 2021-02-20 NOTE — Progress Notes (Signed)
HPI: FU CAD. Cardiac catheterization December 2019 showed 95% proximal to mid LAD which was treated with drug-eluting stent.  Ejection fraction 55 to 65%.  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 Echo 8/21 showed normal LV function, grade 1 DD. Nuclear study 8/21 showed EF 69 with normal perfusion. Since last seen, she has dyspnea with more vigorous activities but not routine activities.  No orthopnea, PND, pedal edema, chest pain or syncope.  Current Outpatient Medications  Medication Sig Dispense Refill   amLODipine (NORVASC) 5 MG tablet TAKE 1/2 TABLET BY MOUTH DAILY. 45 tablet 2   Ascorbic Acid (VITAMIN C) 1000 MG tablet 1 tablet     aspirin EC 81 MG tablet Take 1 tablet (81 mg total) by mouth daily. 90 tablet 3   bisoprolol (ZEBETA) 5 MG tablet Take 5 mg by mouth at bedtime.     Coenzyme Q10 (CO Q 10) 100 MG CAPS Take 100 mg by mouth daily.      ergocalciferol (VITAMIN D2) 50000 UNITS capsule Take 50,000 Units by mouth once a week.      FLUoxetine (PROZAC) 40 MG capsule Take 40 mg by mouth daily.     hydroxychloroquine (PLAQUENIL) 200 MG tablet Take 200 mg by mouth daily.      losartan (COZAAR) 50 MG tablet Take 50 mg by mouth daily.     Magnesium 500 MG TABS Take 500 mg by mouth daily.     Multiple Vitamins-Minerals (CENTRUM ADULTS PO) Take 1 tablet by mouth daily.     nitroGLYCERIN (NITROSTAT) 0.4 MG SL tablet DISSOLVE 1 TABLET UNDER THE TONGUE EVERY 5 MINUTES FOR UP TO 3 DOSES AS NEEDED FOR CHEST PAIN. IF NO RELIEF AFTER 3 DOSES, CALL 911 OR GO TO ER. 25 tablet 2   omeprazole (PRILOSEC) 40 MG capsule Take 40 mg by mouth daily. Takes prn     predniSONE (DELTASONE) 5 MG tablet Take 5 mg by mouth daily with breakfast.     REPATHA SURECLICK 010 MG/ML SOAJ Inject 1 Syringe into the skin every 14 (fourteen) days.     Magnesium 250 MG TABS 2x weekly     No current facility-administered medications for this visit.     Past  Medical History:  Diagnosis Date   Anxiety    Asthmatic bronchitis    Chronic bronchitis ([place])    "get it q yr; we head w/wood stove" (12/23/2017)   Chronic kidney disease    "Creatinine level elevated last couple years" (12/23/2017)   Chronic lower back pain    COPD (chronic obstructive pulmonary disease) ([facility])    Coronary artery disease    a. LHC 12/23/2017: pLAD to mLAD 95% s/p DES   Depression    DJD (degenerative joint disease)    Fibromyalgia    GERD (gastroesophageal reflux disease)    History of hiatal hernia    History of kidney stones    Hyperlipidemia    Hypertension    Pneumonia    Prediabetes    Rheumatoid arthritis ([place])    Sleep apnea    "home test indicated that I had it; I've never used mask" (12/23/2017)   Tension headache    "controlled" (12/23/2017)    Past Surgical History:  Procedure Laterality Date   BACK SURGERY     CARDIAC CATHETERIZATION N/A 04/08/2015   Procedure: Left Heart Cath and Coronary Angiography;  Surgeon: [name], MD;  Location: [facility];  Service: Cardiovascular;  Laterality: N/A;   CARDIAC CATHETERIZATION N/A 04/08/2015   Procedure: Intravascular Pressure Wire/FFR Study;  Surgeon: [name], MD;  Location: [facility];  Service: Cardiovascular;  Laterality: N/A;   CATARACT EXTRACTION W/ INTRAOCULAR LENS IMPLANT     "? side" (12/23/2017)   CORONARY ANGIOPLASTY WITH STENT PLACEMENT  12/23/2017   CORONARY STENT INTERVENTION N/A 12/23/2017   Procedure: CORONARY STENT INTERVENTION;  Surgeon: Martinique, [name], MD;  Location: [facility];  Service: Cardiovascular;  Laterality: N/A;   LEFT HEART CATH AND CORONARY ANGIOGRAPHY N/A 12/23/2017   Procedure: LEFT HEART CATH AND CORONARY ANGIOGRAPHY;  Surgeon: Martinique, [name], MD;  Location: [facility];  Service: Cardiovascular;  Laterality: N/A;   LUMBAR DISC SURGERY     "ruptured disc"    Social History   Socioeconomic History   Marital status:  Widowed    Spouse name: Not on file   Number of children: 3   Years of education: Not on file   Highest education level: Not on file  Occupational History   Not on file  Tobacco Use   Smoking status: Every Day    Packs/day: 0.50    Types: Cigarettes   Smokeless tobacco: Never  Vaping Use   Vaping Use: Never used  Substance and Sexual Activity   Alcohol use: Yes    Comment: 12/23/2017 "maybe 1 drink q 6 months"   Drug use: Never   Sexual activity: Not Currently  Other Topics Concern   Not on file  Social History Narrative   Not on file   Social Determinants of Health   Financial Resource Strain: Not on file  Food Insecurity: Not on file  Transportation Needs: Not on file  Physical Activity: Not on file  Stress: Not on file  Social Connections: Not on file  Intimate Partner Violence: Not on file    Family History  Problem Relation Age of Onset   Heart failure Mother    Heart disease Mother    Diabetes Mother    Hypertension Mother    Emphysema Father    Heart failure Father        CABG, AVR    ROS: no fevers or chills, productive cough, hemoptysis, dysphasia, odynophagia, melena, hematochezia, dysuria, hematuria, rash, seizure activity, orthopnea, PND, pedal edema, claudication. Remaining systems are negative.  Physical Exam: Well-developed well-nourished in no acute distress.  Skin is warm and dry.  HEENT is normal.  Neck is supple.  Chest is clear to auscultation with normal expansion.  Cardiovascular exam is regular rate and rhythm.  Abdominal exam nontender or distended. No masses palpated. Extremities show no edema. neuro grossly intact  ECG-normal sinus rhythm at a rate of 60, no ST changes.  Personally reviewed  A/P  1 coronary artery disease-continue ASA; intolerant to statins. Most recent nuclear study showed no ischemia.    2 hypertension-blood pressure controlled.  Continue present medical regimen.    3 hyperlipidemia-intolerant to statins.   Continue Repatha.   4 ectatic abdominal aorta-plan follow-up ultrasound July 2025.   5 chronic stage III kidney disease-patient is followed by nephrology.  Kirk Ruths, MD

## 2021-03-02 ENCOUNTER — Other Ambulatory Visit: Payer: Self-pay

## 2021-03-02 ENCOUNTER — Ambulatory Visit: Payer: Medicare Other | Admitting: Cardiology

## 2021-03-02 ENCOUNTER — Encounter: Payer: Self-pay | Admitting: Cardiology

## 2021-03-02 VITALS — BP 144/76 | HR 60 | Ht 65.0 in | Wt 194.8 lb

## 2021-03-02 DIAGNOSIS — I2510 Atherosclerotic heart disease of native coronary artery without angina pectoris: Secondary | ICD-10-CM | POA: Diagnosis not present

## 2021-03-02 DIAGNOSIS — I714 Abdominal aortic aneurysm, without rupture, unspecified: Secondary | ICD-10-CM | POA: Diagnosis not present

## 2021-03-02 DIAGNOSIS — I10 Essential (primary) hypertension: Secondary | ICD-10-CM

## 2021-03-02 DIAGNOSIS — E7800 Pure hypercholesterolemia, unspecified: Secondary | ICD-10-CM

## 2021-03-02 MED ORDER — NITROGLYCERIN 0.4 MG SL SUBL: SUBLINGUAL_TABLET | SUBLINGUAL | 2 refills | Status: DC

## 2021-03-02 NOTE — Patient Instructions (Signed)

## 2021-05-11 ENCOUNTER — Other Ambulatory Visit: Payer: Self-pay | Admitting: Registered Nurse

## 2021-05-11 DIAGNOSIS — Z87891 Personal history of nicotine dependence: Secondary | ICD-10-CM

## 2021-06-08 ENCOUNTER — Encounter: Payer: Self-pay | Admitting: Neurology

## 2021-06-08 ENCOUNTER — Ambulatory Visit
Admission: RE | Admit: 2021-06-08 | Discharge: 2021-06-08 | Disposition: A | Discharge status: Home / Self Care | Payer: Medicare Other | Source: Ambulatory Visit | Attending: Registered Nurse | Admitting: Registered Nurse

## 2021-06-08 DIAGNOSIS — Z87891 Personal history of nicotine dependence: Secondary | ICD-10-CM

## 2021-06-08 DIAGNOSIS — J929 Pleural plaque without asbestos: Secondary | ICD-10-CM | POA: Diagnosis not present

## 2021-06-08 DIAGNOSIS — J432 Centrilobular emphysema: Secondary | ICD-10-CM | POA: Diagnosis not present

## 2021-06-08 DIAGNOSIS — I2510 Atherosclerotic heart disease of native coronary artery without angina pectoris: Secondary | ICD-10-CM | POA: Diagnosis not present

## 2021-06-14 DIAGNOSIS — M1712 Unilateral primary osteoarthritis, left knee: Secondary | ICD-10-CM | POA: Diagnosis not present

## 2021-06-15 NOTE — Progress Notes (Unsigned)
Assessment/Plan:   1.  Tremor  -Reassured her that she does not meet clinical criteria for Parkinson's disease  -Tremor itself has both characteristics of essential tremor and parkinsonian tremor.  While this can happen in longstanding essential tremor, she reports that tremor really has not been going on that long.  Because of that, I really would like to do an MRI of the brain.  She was agreeable.  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  It would also show white matter disease as well as likely atrophy.  We just want to make sure we are not missing other things.  -We discussed DaTscan, but she ultimately decided to hold off on that for now.  -We discussed various symptomatic medications for tremor.  She is already on a very low-dose beta-blocker, that likely would not be helping for tremor right now.  Ultimately, she decided to hold off on further medication.  2.  If above neg,I would like to follow her q 6 months. She was agreeable.    Subjective:   Janet Johnston was seen today in the movement disorders clinic for neurologic consultation at the request of Holland Commons, [place].  The consultation is for the evaluation of hand tremor and balance difficulties.  Tremor: Yes.     How long has it been going on? 6 months - 1 year  At rest or with activation?  both  When is it noted the most?  writing  Fam hx of tremor?  Yes.   maternal aunt with tremor; maternal uncle with LBD  Located where? Mainly the R hand, some on the L but she is R handed so notes it most there  Affected by caffeine:  No.  Affected by alcohol:  doesn't drink enough to know  Affected by stress:  No.  Affected by fatigue:  Yes.    Spills soup if on spoon:  may or may not  Spills glass of liquid if full:  may or may not  Affects ADL's (tying shoes, brushing teeth, etc):  No.  Other Specific Symptoms:  Voice: no change Sleep: sleeps pretty well  (nocturia x 1)  Vivid Dreams:  Yes.    Acting out dreams:  No. (Liittle sleep talk) Wet Pillows: occasionally Postural symptoms:  Yes.  , walks drunk like but she isn't  Falls?  No. But near fall Bradykinesia symptoms: difficulty getting out of a chair (relates to bad knees); no shuffle; no slow movements Loss of smell:  No. Loss of taste:  No. Urinary Incontinence:  has some urgency and will have some occ urge incontinence Difficulty Swallowing:  Yes.  , occasionally with liquids  Handwriting, micrographia: No. Trouble with ADL's:  No.  Trouble buttoning clothing: Yes.   Depression:  No.  Memory changes:  occasionally trouble with word finding/trouble with names N/V:  No. Lightheaded:  No.  Syncope: No. Diplopia:  No.  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   ALLERGIES:   Allergies  Allergen Reactions   Atorvastatin Other (See Comments)   Iodinated Contrast Media    Rosuvastatin Other (See Comments)   Zetia [Ezetimibe] Diarrhea   Lyrica [Pregabalin] Itching and Rash   Penicillins Itching and Rash    Has patient had a PCN reaction causing immediate rash, facial/tongue/throat swelling, SOB or lightheadedness with  hypotension: No Has patient had a PCN reaction causing severe rash involving mucus membranes or skin necrosis: No Has patient had a PCN reaction that required hospitalization: No Has patient had a PCN reaction occurring within the last 10 years: No If all of the above answers are "NO", then may proceed with Cephalosporin use.    Sulfa Antibiotics Itching and Rash    CURRENT MEDICATIONS:  Current Outpatient Medications  Medication Instructions   Ascorbic Acid (VITAMIN C) 1000 MG tablet 1 tablet   aspirin EC 81 mg, Oral, Daily   bisoprolol (ZEBETA) 5 mg, Daily at bedtime   Co Q 10 100 mg, Oral, Daily   ergocalciferol (VITAMIN D2) 50,000 Units,  Oral, Weekly   FLUoxetine (PROZAC) 20 mg, Oral, Every morning   losartan (COZAAR) 50 mg, Oral, Daily   Magnesium 250 MG TABS 3x weekly   Multiple Vitamins-Minerals (CENTRUM ADULTS PO) 1 tablet, Oral, Daily   nitroGLYCERIN (NITROSTAT) 0.4 MG SL tablet DISSOLVE 1 TABLET UNDER THE TONGUE EVERY 5 MINUTES FOR UP TO 3 DOSES AS NEEDED FOR CHEST PAIN. IF NO RELIEF AFTER 3 DOSES, CALL 911 OR GO TO ER.   omeprazole (PRILOSEC) 40 mg, Oral, Daily, Takes prn   predniSONE (DELTASONE) 5 mg, Oral, Daily with breakfast   REPATHA SURECLICK 314 MG/ML SOAJ 1 Syringe, Subcutaneous, Every 14 days    Objective:   PHYSICAL EXAMINATION:    VITALS:   Vitals:   06/19/21 0957  BP: 126/72  Pulse: 63  SpO2: 97%  Weight: 198 lb 3.2 oz (89.9 kg)  Height: '5\' 4"'$  (1.626 m)    GEN:  The patient appears stated age and is in NAD. HEENT:  Normocephalic, atraumatic.  The mucous membranes are moist. The superficial temporal arteries are without ropiness or tenderness. CV:  RRR Lungs:  CTAB Neck/HEME:  There are no carotid bruits bilaterally.  Neurological examination:  Orientation: The patient is alert and oriented x3.  Cranial nerves: There is good facial symmetry.  Extraocular muscles are intact. The visual fields are full to confrontational testing. The speech is fluent and clear. Soft palate rises symmetrically and there is no tongue deviation. Hearing is intact to conversational tone. Sensation: Sensation is intact to light touch throughout (facial, trunk, extremities). Vibration is intact at the bilateral big toe. There is no extinction with double simultaneous stimulation.  Motor: Strength is 5/5 in the bilateral upper and lower extremities.   Shoulder shrug is equal and symmetric.  There is no pronator drift.   Movement examination: Tone: There is nl tone in the bilateral upper extremities.  The tone in the lower extremities is nl.  Abnormal movements: there is a RUE rest tremor and rare LUE rest tremor.   Doesn't increase with distraction procedures.  She has mild postural and intention tremor.  There is mild tremor with Archimedes spirals, right greater than left.  She has some tremor when pouring water from 1 glass to another, but really does not spill much. Coordination:  There is no decremation with RAM's, with any form of RAMS, including alternating supination and pronation of the forearm, hand opening and closing, finger taps, heel taps and toe taps. Gait and Station: The patient has no difficulty arising out of a deep-seated chair without the use of the hands. The patient's stride length is good with good arm swing bilaterally.     I have reviewed and interpreted the following labs independently   Chemistry      Component Value Date/Time   NA  140 12/24/2017 0458   NA 142 12/18/2017 1500   K 3.9 12/24/2017 0458   CL 106 12/24/2017 0458   CO2 21 (L) 12/24/2017 0458   BUN 24 (H) 12/24/2017 0458   BUN 20 12/18/2017 1500   CREATININE 1.57 (H) 12/24/2017 0458   CREATININE 1.08 (H) 04/06/2015 1544      Component Value Date/Time   CALCIUM 8.9 12/24/2017 0458   ALKPHOS 98 05/30/2016 1256   AST 18 05/30/2016 1256   ALT 14 05/30/2016 1256   BILITOT 0.4 05/30/2016 1256      No results found for: "TSH" Lab Results  Component Value Date   WBC 8.7 12/24/2017   HGB 12.5 12/24/2017   HCT 39.1 12/24/2017   MCV 88.1 12/24/2017   PLT PLATELET CLUMPS NOTED ON SMEAR, UNABLE TO ESTIMATE 12/24/2017   Labs sent by primary care noted TSH 1.56 on May 03, 2021.  Her hemoglobin A1c was 5.8.   Total time spent on today's visit was 45 minutes, including both face-to-face time and nonface-to-face time.  Time included that spent on review of records (prior notes available to me/labs/imaging if pertinent), discussing treatment and goals, answering patient's questions and coordinating care.  Cc:  Holland Commons, FNP

## 2021-06-19 ENCOUNTER — Ambulatory Visit: Payer: Medicare Other | Admitting: Neurology

## 2021-06-19 ENCOUNTER — Encounter: Payer: Self-pay | Admitting: Neurology

## 2021-06-19 VITALS — BP 126/72 | HR 63 | Ht 64.0 in | Wt 198.2 lb

## 2021-06-19 DIAGNOSIS — G20C Parkinsonism, unspecified: Secondary | ICD-10-CM

## 2021-06-19 DIAGNOSIS — R251 Tremor, unspecified: Secondary | ICD-10-CM | POA: Diagnosis not present

## 2021-06-19 DIAGNOSIS — G20 Parkinson's disease: Secondary | ICD-10-CM | POA: Diagnosis not present

## 2021-06-19 NOTE — Patient Instructions (Signed)
A referral to [facility] has been placed for your MRI someone will contact you directly to schedule your appt. They are located at [address] Please contact them directly by calling [phone] with any questions regarding your referral.  

## 2021-07-01 ENCOUNTER — Ambulatory Visit
Admission: RE | Admit: 2021-07-01 | Discharge: 2021-07-01 | Disposition: A | Discharge status: Home / Self Care | Payer: Medicare Other | Source: Ambulatory Visit | Attending: Neurology | Admitting: Neurology

## 2021-07-01 DIAGNOSIS — G20 Parkinson's disease: Secondary | ICD-10-CM | POA: Diagnosis not present

## 2021-07-01 DIAGNOSIS — I619 Nontraumatic intracerebral hemorrhage, unspecified: Secondary | ICD-10-CM | POA: Diagnosis not present

## 2021-07-01 DIAGNOSIS — I739 Peripheral vascular disease, unspecified: Secondary | ICD-10-CM | POA: Diagnosis not present

## 2021-07-18 DIAGNOSIS — L821 Other seborrheic keratosis: Secondary | ICD-10-CM | POA: Diagnosis not present

## 2021-07-18 DIAGNOSIS — L723 Sebaceous cyst: Secondary | ICD-10-CM | POA: Diagnosis not present

## 2021-07-27 DIAGNOSIS — N261 Atrophy of kidney (terminal): Secondary | ICD-10-CM | POA: Diagnosis not present

## 2021-07-27 DIAGNOSIS — N184 Chronic kidney disease, stage 4 (severe): Secondary | ICD-10-CM | POA: Diagnosis not present

## 2021-07-27 DIAGNOSIS — I129 Hypertensive chronic kidney disease with stage 1 through stage 4 chronic kidney disease, or unspecified chronic kidney disease: Secondary | ICD-10-CM | POA: Diagnosis not present

## 2021-08-07 DIAGNOSIS — G894 Chronic pain syndrome: Secondary | ICD-10-CM | POA: Diagnosis not present

## 2021-08-07 DIAGNOSIS — M4316 Spondylolisthesis, lumbar region: Secondary | ICD-10-CM | POA: Diagnosis not present

## 2021-08-07 DIAGNOSIS — M5416 Radiculopathy, lumbar region: Secondary | ICD-10-CM | POA: Diagnosis not present

## 2021-08-09 DIAGNOSIS — E7800 Pure hypercholesterolemia, unspecified: Secondary | ICD-10-CM | POA: Diagnosis not present

## 2021-08-09 DIAGNOSIS — Z0000 Encounter for general adult medical examination without abnormal findings: Secondary | ICD-10-CM | POA: Diagnosis not present

## 2021-08-09 DIAGNOSIS — R7303 Prediabetes: Secondary | ICD-10-CM | POA: Diagnosis not present

## 2021-08-09 DIAGNOSIS — E559 Vitamin D deficiency, unspecified: Secondary | ICD-10-CM | POA: Diagnosis not present

## 2021-08-10 DIAGNOSIS — L720 Epidermal cyst: Secondary | ICD-10-CM | POA: Diagnosis not present

## 2021-08-16 DIAGNOSIS — N184 Chronic kidney disease, stage 4 (severe): Secondary | ICD-10-CM | POA: Diagnosis not present

## 2021-08-16 DIAGNOSIS — I2510 Atherosclerotic heart disease of native coronary artery without angina pectoris: Secondary | ICD-10-CM | POA: Diagnosis not present

## 2021-08-16 DIAGNOSIS — E7800 Pure hypercholesterolemia, unspecified: Secondary | ICD-10-CM | POA: Diagnosis not present

## 2021-08-16 DIAGNOSIS — E559 Vitamin D deficiency, unspecified: Secondary | ICD-10-CM | POA: Diagnosis not present

## 2021-08-16 DIAGNOSIS — R7303 Prediabetes: Secondary | ICD-10-CM | POA: Diagnosis not present

## 2021-08-16 DIAGNOSIS — M069 Rheumatoid arthritis, unspecified: Secondary | ICD-10-CM | POA: Diagnosis not present

## 2021-08-16 DIAGNOSIS — I10 Essential (primary) hypertension: Secondary | ICD-10-CM | POA: Diagnosis not present

## 2021-08-17 DIAGNOSIS — M5416 Radiculopathy, lumbar region: Secondary | ICD-10-CM | POA: Diagnosis not present

## 2021-08-22 ENCOUNTER — Telehealth: Payer: Self-pay | Admitting: Neurology

## 2021-08-22 NOTE — Telephone Encounter (Signed)
Patient called and left a voice mail. She'd like to speak with clinical staff about her symptoms and maybe schedule a sooner appointment with Dr. Carles Collet.

## 2021-08-23 NOTE — Telephone Encounter (Signed)
Patient had back injection on Thursday and received a lot of fire ant bites. Patient went to her church for a hot dog sale and was feeling off balance and feeling really rotten and wanted to take a Benadryl. I instructed patient this doesn't sound neurological and she should call her doctor that did her back injection about the nerve pain in her back and call PCP about taking a benadryl for the fire ants

## 2021-08-30 DIAGNOSIS — M2550 Pain in unspecified joint: Secondary | ICD-10-CM | POA: Diagnosis not present

## 2021-08-30 DIAGNOSIS — N1832 Chronic kidney disease, stage 3b: Secondary | ICD-10-CM | POA: Diagnosis not present

## 2021-08-30 DIAGNOSIS — M5136 Other intervertebral disc degeneration, lumbar region: Secondary | ICD-10-CM | POA: Diagnosis not present

## 2021-08-30 DIAGNOSIS — M797 Fibromyalgia: Secondary | ICD-10-CM | POA: Diagnosis not present

## 2021-08-30 DIAGNOSIS — M0579 Rheumatoid arthritis with rheumatoid factor of multiple sites without organ or systems involvement: Secondary | ICD-10-CM | POA: Diagnosis not present

## 2021-08-30 DIAGNOSIS — M1991 Primary osteoarthritis, unspecified site: Secondary | ICD-10-CM | POA: Diagnosis not present

## 2021-09-22 DIAGNOSIS — M545 Low back pain, unspecified: Secondary | ICD-10-CM | POA: Diagnosis not present

## 2021-10-12 DIAGNOSIS — M170 Bilateral primary osteoarthritis of knee: Secondary | ICD-10-CM | POA: Diagnosis not present

## 2021-10-17 DIAGNOSIS — I129 Hypertensive chronic kidney disease with stage 1 through stage 4 chronic kidney disease, or unspecified chronic kidney disease: Secondary | ICD-10-CM | POA: Diagnosis not present

## 2021-10-17 DIAGNOSIS — N390 Urinary tract infection, site not specified: Secondary | ICD-10-CM | POA: Diagnosis not present

## 2021-10-17 DIAGNOSIS — N184 Chronic kidney disease, stage 4 (severe): Secondary | ICD-10-CM | POA: Diagnosis not present

## 2021-10-17 DIAGNOSIS — N261 Atrophy of kidney (terminal): Secondary | ICD-10-CM | POA: Diagnosis not present

## 2021-10-26 ENCOUNTER — Encounter: Payer: Self-pay | Admitting: *Deleted

## 2021-11-03 ENCOUNTER — Other Ambulatory Visit: Payer: Self-pay | Admitting: Registered Nurse

## 2021-11-03 DIAGNOSIS — Z1231 Encounter for screening mammogram for malignant neoplasm of breast: Secondary | ICD-10-CM

## 2021-11-08 DIAGNOSIS — Z23 Encounter for immunization: Secondary | ICD-10-CM | POA: Diagnosis not present

## 2021-11-08 DIAGNOSIS — M79604 Pain in right leg: Secondary | ICD-10-CM | POA: Diagnosis not present

## 2021-11-08 DIAGNOSIS — I83891 Varicose veins of right lower extremities with other complications: Secondary | ICD-10-CM | POA: Diagnosis not present

## 2021-11-15 DIAGNOSIS — I83891 Varicose veins of right lower extremities with other complications: Secondary | ICD-10-CM | POA: Diagnosis not present

## 2021-11-15 DIAGNOSIS — M79661 Pain in right lower leg: Secondary | ICD-10-CM | POA: Diagnosis not present

## 2021-11-15 DIAGNOSIS — M79604 Pain in right leg: Secondary | ICD-10-CM | POA: Diagnosis not present

## 2021-11-15 DIAGNOSIS — I8001 Phlebitis and thrombophlebitis of superficial vessels of right lower extremity: Secondary | ICD-10-CM | POA: Diagnosis not present

## 2021-11-27 ENCOUNTER — Ambulatory Visit
Admission: RE | Admit: 2021-11-27 | Discharge: 2021-11-27 | Disposition: A | Discharge status: Home / Self Care | Payer: Medicare Other | Source: Ambulatory Visit | Attending: Registered Nurse | Admitting: Registered Nurse

## 2021-11-27 ENCOUNTER — Ambulatory Visit: Payer: Medicare Other

## 2021-11-27 ENCOUNTER — Other Ambulatory Visit: Payer: Self-pay | Admitting: Family Medicine

## 2021-11-27 DIAGNOSIS — Z1231 Encounter for screening mammogram for malignant neoplasm of breast: Secondary | ICD-10-CM

## 2021-11-27 DIAGNOSIS — Z006 Encounter for examination for normal comparison and control in clinical research program: Secondary | ICD-10-CM

## 2021-12-04 ENCOUNTER — Other Ambulatory Visit: Payer: Self-pay | Admitting: Registered Nurse

## 2021-12-04 DIAGNOSIS — R928 Other abnormal and inconclusive findings on diagnostic imaging of breast: Secondary | ICD-10-CM

## 2021-12-13 DIAGNOSIS — I83811 Varicose veins of right lower extremities with pain: Secondary | ICD-10-CM | POA: Diagnosis not present

## 2021-12-13 DIAGNOSIS — M2520 Flail joint, unspecified joint: Secondary | ICD-10-CM | POA: Diagnosis not present

## 2021-12-13 DIAGNOSIS — R252 Cramp and spasm: Secondary | ICD-10-CM | POA: Diagnosis not present

## 2021-12-13 DIAGNOSIS — I80201 Phlebitis and thrombophlebitis of unspecified deep vessels of right lower extremity: Secondary | ICD-10-CM | POA: Diagnosis not present

## 2021-12-13 DIAGNOSIS — G2581 Restless legs syndrome: Secondary | ICD-10-CM | POA: Diagnosis not present

## 2021-12-19 ENCOUNTER — Other Ambulatory Visit: Payer: Self-pay

## 2021-12-20 ENCOUNTER — Ambulatory Visit: Payer: Medicare Other | Admitting: Neurology

## 2021-12-20 DIAGNOSIS — R252 Cramp and spasm: Secondary | ICD-10-CM | POA: Diagnosis not present

## 2021-12-20 DIAGNOSIS — I809 Phlebitis and thrombophlebitis of unspecified site: Secondary | ICD-10-CM | POA: Diagnosis not present

## 2021-12-20 DIAGNOSIS — G2581 Restless legs syndrome: Secondary | ICD-10-CM | POA: Diagnosis not present

## 2021-12-20 DIAGNOSIS — M7989 Other specified soft tissue disorders: Secondary | ICD-10-CM | POA: Diagnosis not present

## 2021-12-20 DIAGNOSIS — I83811 Varicose veins of right lower extremities with pain: Secondary | ICD-10-CM | POA: Diagnosis not present

## 2021-12-20 DIAGNOSIS — I2510 Atherosclerotic heart disease of native coronary artery without angina pectoris: Secondary | ICD-10-CM | POA: Diagnosis not present

## 2021-12-21 DIAGNOSIS — I82409 Acute embolism and thrombosis of unspecified deep veins of unspecified lower extremity: Secondary | ICD-10-CM | POA: Diagnosis not present

## 2021-12-27 DIAGNOSIS — R252 Cramp and spasm: Secondary | ICD-10-CM | POA: Diagnosis not present

## 2021-12-27 DIAGNOSIS — M2520 Flail joint, unspecified joint: Secondary | ICD-10-CM | POA: Diagnosis not present

## 2021-12-27 DIAGNOSIS — G2581 Restless legs syndrome: Secondary | ICD-10-CM | POA: Diagnosis not present

## 2021-12-27 DIAGNOSIS — I82401 Acute embolism and thrombosis of unspecified deep veins of right lower extremity: Secondary | ICD-10-CM | POA: Diagnosis not present

## 2021-12-27 DIAGNOSIS — M7989 Other specified soft tissue disorders: Secondary | ICD-10-CM | POA: Diagnosis not present

## 2021-12-27 DIAGNOSIS — I8001 Phlebitis and thrombophlebitis of superficial vessels of right lower extremity: Secondary | ICD-10-CM | POA: Diagnosis not present

## 2021-12-27 DIAGNOSIS — I83811 Varicose veins of right lower extremities with pain: Secondary | ICD-10-CM | POA: Diagnosis not present

## 2022-01-16 ENCOUNTER — Ambulatory Visit
Admission: RE | Admit: 2022-01-16 | Discharge: 2022-01-16 | Disposition: A | Discharge status: Home / Self Care | Payer: Medicare Other | Source: Ambulatory Visit | Attending: Registered Nurse | Admitting: Registered Nurse

## 2022-01-16 DIAGNOSIS — R921 Mammographic calcification found on diagnostic imaging of breast: Secondary | ICD-10-CM | POA: Diagnosis not present

## 2022-01-16 DIAGNOSIS — R928 Other abnormal and inconclusive findings on diagnostic imaging of breast: Secondary | ICD-10-CM

## 2022-01-17 DIAGNOSIS — R252 Cramp and spasm: Secondary | ICD-10-CM | POA: Diagnosis not present

## 2022-01-17 DIAGNOSIS — I83811 Varicose veins of right lower extremities with pain: Secondary | ICD-10-CM | POA: Diagnosis not present

## 2022-01-17 DIAGNOSIS — M7989 Other specified soft tissue disorders: Secondary | ICD-10-CM | POA: Diagnosis not present

## 2022-01-17 DIAGNOSIS — G2581 Restless legs syndrome: Secondary | ICD-10-CM | POA: Diagnosis not present

## 2022-01-17 DIAGNOSIS — I8001 Phlebitis and thrombophlebitis of superficial vessels of right lower extremity: Secondary | ICD-10-CM | POA: Diagnosis not present

## 2022-01-17 DIAGNOSIS — R600 Localized edema: Secondary | ICD-10-CM | POA: Diagnosis not present

## 2022-01-18 ENCOUNTER — Other Ambulatory Visit: Payer: Self-pay | Admitting: Registered Nurse

## 2022-01-18 DIAGNOSIS — R921 Mammographic calcification found on diagnostic imaging of breast: Secondary | ICD-10-CM

## 2022-01-24 ENCOUNTER — Ambulatory Visit
Admission: RE | Admit: 2022-01-24 | Discharge: 2022-01-24 | Disposition: A | Discharge status: Home / Self Care | Payer: Medicare Other | Source: Ambulatory Visit | Attending: Registered Nurse | Admitting: Registered Nurse

## 2022-01-24 DIAGNOSIS — R921 Mammographic calcification found on diagnostic imaging of breast: Secondary | ICD-10-CM

## 2022-01-24 DIAGNOSIS — D242 Benign neoplasm of left breast: Secondary | ICD-10-CM | POA: Diagnosis not present

## 2022-01-24 HISTORY — PX: BREAST BIOPSY: SHX20

## 2022-02-20 DIAGNOSIS — I8001 Phlebitis and thrombophlebitis of superficial vessels of right lower extremity: Secondary | ICD-10-CM | POA: Diagnosis not present

## 2022-02-20 DIAGNOSIS — I83811 Varicose veins of right lower extremities with pain: Secondary | ICD-10-CM | POA: Diagnosis not present

## 2022-02-20 DIAGNOSIS — R600 Localized edema: Secondary | ICD-10-CM | POA: Diagnosis not present

## 2022-02-20 DIAGNOSIS — M7989 Other specified soft tissue disorders: Secondary | ICD-10-CM | POA: Diagnosis not present

## 2022-02-20 DIAGNOSIS — I872 Venous insufficiency (chronic) (peripheral): Secondary | ICD-10-CM | POA: Diagnosis not present

## 2022-02-20 DIAGNOSIS — M79604 Pain in right leg: Secondary | ICD-10-CM | POA: Diagnosis not present

## 2022-02-26 NOTE — Progress Notes (Signed)
HPI:  FU CAD. Cardiac catheterization December 2019 showed 95% proximal to mid LAD which was treated with drug-eluting stent.  Ejection fraction 55 to 65%.  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 Echo 8/21 showed normal LV function, grade 1 DD. Nuclear study 8/21 showed EF 69 with normal perfusion. Since last seen, she has some dyspnea on exertion but no exertional chest pain.  No palpitations or syncope.  She was diagnosed with DVT by podiatrist and is on Eliquis.  Current Outpatient Medications  Medication Sig Dispense Refill   Ascorbic Acid (VITAMIN C) 1000 MG tablet 1 tablet     bisoprolol (ZEBETA) 10 MG tablet Take 10 mg by mouth daily.     Coenzyme Q10 (CO Q 10) 100 MG CAPS Take 100 mg by mouth daily.      ELIQUIS 5 MG TABS tablet Take 5 mg by mouth 2 (two) times daily.     ergocalciferol (VITAMIN D2) 50000 UNITS capsule Take 50,000 Units by mouth once a week.      FLUoxetine (PROZAC) 20 MG capsule Take 40 mg by mouth every morning.     losartan (COZAAR) 50 MG tablet Take 100 mg by mouth daily.     Magnesium 250 MG TABS 3x weekly     Multiple Vitamins-Minerals (CENTRUM ADULTS PO) Take 1 tablet by mouth daily.     nitroGLYCERIN (NITROSTAT) 0.4 MG SL tablet DISSOLVE 1 TABLET UNDER THE TONGUE EVERY 5 MINUTES FOR UP TO 3 DOSES AS NEEDED FOR CHEST PAIN. IF NO RELIEF AFTER 3 DOSES, CALL 911 OR GO TO ER. 25 tablet 2   predniSONE (DELTASONE) 5 MG tablet Take 5 mg by mouth daily with breakfast.     REPATHA SURECLICK [id number] MG/ML SOAJ Inject 1 Syringe into the skin every 14 (fourteen) days.     No current facility-administered medications for this visit.     Past Medical History:  Diagnosis Date   Anxiety    Asthmatic bronchitis    Chronic bronchitis ([place])    "get it q yr; we head w/wood stove" (12/23/2017)   Chronic kidney disease    "Creatinine level elevated last couple years" (12/23/2017)   Chronic lower back pain    COPD  (chronic obstructive pulmonary disease) (H[facility])    Coronary artery disease    a. LHC 12/23/2017: pLAD to mLAD 95% s/p DES   Depression    DJD (degenerative joint disease)    Fibromyalgia    GERD (gastroesophageal reflux disease)    History of hiatal hernia    History of kidney stones    Hyperlipidemia    Hypertension    Pneumonia    Prediabetes    Rheumatoid arthritis ([place])    Sleep apnea    "home test indicated that I had it; I've never used mask" (12/23/2017)   Tension headache    "controlled" (12/23/2017)    Past Surgical History:  Procedure Laterality Date   BACK SURGERY     BREAST BIOPSY Left 01/24/2022   MM LT BREAST BX W LOC DEV 1ST LESION IMAGE BX SPEC STEREO GUIDE 01/24/2022 [facility]   CARDIAC CATHETERIZATION N/A 04/08/2015   Procedure: Left Heart Cath and Coronary Angiography;  Surgeon: [name], MD;  Location: [facility];  Service: Cardiovascular;  Laterality: N/A;   CARDIAC CATHETERIZATION N/A 04/08/2015   Procedure: Intravascular Pressure Wire/FFR Study;  Surgeon: [name], MD;  Location: [facility];  Service: Cardiovascular;  Laterality: N/A;   CATARACT EXTRACTION W/ INTRAOCULAR LENS IMPLANT     "? side" (12/23/2017)   CORONARY ANGIOPLASTY WITH STENT PLACEMENT  12/23/2017   CORONARY STENT INTERVENTION N/A 12/23/2017   Procedure: CORONARY STENT INTERVENTION;  Surgeon: Martinique, [name], MD;  Location: [facility];  Service: Cardiovascular;  Laterality: N/A;   LEFT HEART CATH AND CORONARY ANGIOGRAPHY N/A 12/23/2017   Procedure: LEFT HEART CATH AND CORONARY ANGIOGRAPHY;  Surgeon: Martinique, [name], MD;  Location: [facility];  Service: Cardiovascular;  Laterality: N/A;   LUMBAR DISC SURGERY     "ruptured disc"    Social History   Socioeconomic History   Marital status: Widowed    Spouse name: Not on file   Number of children: 3   Years of education: Not on file   Highest education level: Not on file  Occupational  History   Occupation: retired    Comment: customer service  Tobacco Use   Smoking status: Former    Packs/day: 0.50    Types: Cigarettes   Smokeless tobacco: Never   Tobacco comments:    Stopped smoking 4 years ago   Vaping Use   Vaping Use: Never used  Substance and Sexual Activity   Alcohol use: Yes    Comment: 12/23/2017 "maybe 1 drink q 6 months"   Drug use: Never   Sexual activity: Not Currently  Other Topics Concern   Not on file  Social History Narrative   Right handed    Lives alone    Customer service    Social Determinants of Health   Financial Resource Strain: Not on file  Food Insecurity: Not on file  Transportation Needs: Not on file  Physical Activity: Not on file  Stress: Not on file  Social Connections: Not on file  Intimate Partner Violence: Not on file    Family History  Problem Relation Age of Onset   Heart failure Mother    Heart disease Mother    Diabetes Mother    Hypertension Mother    Emphysema Father    Heart failure Father        CABG, AVR    ROS: no fevers or chills, productive cough, hemoptysis, dysphasia, odynophagia, melena, hematochezia, dysuria, hematuria, rash, seizure activity, orthopnea, PND, pedal edema, claudication. Remaining systems are negative.  Physical Exam: Well-developed well-nourished in no acute distress.  Skin is warm and dry.  HEENT is normal.  Neck is supple.  Chest is clear to auscultation with normal expansion.  Cardiovascular exam is regular rate and rhythm.  Abdominal exam nontender or distended. No masses palpated. Extremities show no edema. neuro grossly intact  ECG-sinus bradycardia at a rate of 55, no ST changes.  Personally reviewed  A/P  1 coronary artery disease-patient denies chest pain.  Continue aspirin.  She is intolerant to statins.  2 hyperlipidemia-intolerant to statins.  Continue Repatha.  3 hypertension-blood pressure controlled.  Continue present medications and follow-up.  4  ectatic abdominal aorta-plan is to have repeat study July 2025.  5 chronic stage III kidney disease-Per nephrology.   Kirk Ruths, MD

## 2022-02-28 DIAGNOSIS — M1991 Primary osteoarthritis, unspecified site: Secondary | ICD-10-CM | POA: Diagnosis not present

## 2022-02-28 DIAGNOSIS — M0579 Rheumatoid arthritis with rheumatoid factor of multiple sites without organ or systems involvement: Secondary | ICD-10-CM | POA: Diagnosis not present

## 2022-02-28 DIAGNOSIS — M797 Fibromyalgia: Secondary | ICD-10-CM | POA: Diagnosis not present

## 2022-02-28 DIAGNOSIS — M2550 Pain in unspecified joint: Secondary | ICD-10-CM | POA: Diagnosis not present

## 2022-02-28 DIAGNOSIS — M5136 Other intervertebral disc degeneration, lumbar region: Secondary | ICD-10-CM | POA: Diagnosis not present

## 2022-02-28 DIAGNOSIS — N1832 Chronic kidney disease, stage 3b: Secondary | ICD-10-CM | POA: Diagnosis not present

## 2022-03-06 NOTE — Progress Notes (Unsigned)
Assessment/Plan:    1.  Tremor             -Reassured her that she does not meet clinical criteria for Parkinson's disease             -Tremor itself has both characteristics of essential tremor and parkinsonian tremor.  Since tremor has not been going on that long and we would not expect rest tremor with this, we are following her clinically.               -We discussed DaTscan, but she ultimately decided to hold off on that for now.             -We discussed various symptomatic medications for tremor.  She is already on a very low-dose beta-blocker, that likely would not be helping for tremor right now.  Ultimately, she decided to hold off on further medication.  Subjective:   Janet Johnston was seen today in follow up for tremor.  My previous records were reviewed prior to todays visit.  Last visit, she had some tremor that had characteristics of essential and parkinsonian tremor, but did not meet criteria for Parkinson's disease.  Because tremor had not been going on that long, we really did not expect a rest component, so decided to follow her clinically.  She had an MRI of the brain.  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  There was small vessel disease that was mild.  I personally reviewed this.  Current prescribed movement disorder medications: ***   PREVIOUS MEDICATIONS: {Parkinson's RX:18200}  ALLERGIES:   Allergies  Allergen Reactions   Atorvastatin Other (See Comments)   Iodinated Contrast Media    Rosuvastatin Other (See Comments)   Zetia [Ezetimibe] Diarrhea   Lyrica [Pregabalin] Itching and Rash   Penicillins Itching and Rash    Has patient had a PCN reaction causing immediate rash, facial/tongue/throat swelling, SOB or lightheadedness with hypotension: No Has patient had a PCN reaction causing severe rash involving mucus membranes or skin necrosis: No Has patient had a PCN reaction that  required hospitalization: No Has patient had a PCN reaction occurring within the last 10 years: No If all of the above answers are "NO", then may proceed with Cephalosporin use.    Sulfa Antibiotics Itching and Rash    CURRENT MEDICATIONS:  No outpatient medications have been marked as taking for the 03/08/22 encounter (Appointment) with Hallis Meditz, Eustace Quail, DO.      Objective:    PHYSICAL EXAMINATION:    VITALS:  There were no vitals filed for this visit.  GEN:  The patient appears stated age and is in NAD. HEENT:  Normocephalic, atraumatic.  The mucous membranes are moist. The superficial temporal arteries are without ropiness or tenderness. CV:  RRR Lungs:  CTAB Neck/HEME:  There are no carotid bruits bilaterally.  Neurological examination:  Orientation: The patient is alert and oriented x3. Cranial nerves: There is good facial symmetry. The speech is fluent and clear. Soft palate rises symmetrically and there is no tongue deviation. Hearing is intact to conversational tone. Sensation: Sensation is intact to light touch throughout Motor: Strength is at least antigravity x4.  Movement examination: Tone: There is nl tone in the bilateral upper extremities.  The tone in the lower extremities is nl.  Abnormal movements: there is a RUE rest tremor and rare LUE rest tremor.  Doesn't increase with distraction procedures.  She has mild postural and intention tremor.  There is mild tremor with Archimedes spirals, right greater than left.  She has some tremor when pouring water from 1 glass to another, but really does not spill much. Coordination:  There is no decremation with RAM's, with any form of RAMS, including alternating supination and pronation of the forearm, hand opening and closing, finger taps, heel taps and toe taps. Gait and Station: The patient has no difficulty arising out of a deep-seated chair without the use of the hands. The patient's stride length is good with good arm  swing bilaterally.   I have reviewed and interpreted the following labs independently   Chemistry      Component Value Date/Time   NA 140 12/24/2017 0458   NA 142 12/18/2017 1500   K 3.9 12/24/2017 0458   CL 106 12/24/2017 0458   CO2 21 (L) 12/24/2017 0458   BUN 24 (H) 12/24/2017 0458   BUN 20 12/18/2017 1500   CREATININE 1.57 (H) 12/24/2017 0458   CREATININE 1.08 (H) 04/06/2015 1544      Component Value Date/Time   CALCIUM 8.9 12/24/2017 0458   ALKPHOS 98 05/30/2016 1256   AST 18 05/30/2016 1256   ALT 14 05/30/2016 1256   BILITOT 0.4 05/30/2016 1256      Lab Results  Component Value Date   WBC 8.7 12/24/2017   HGB 12.5 12/24/2017   HCT 39.1 12/24/2017   MCV 88.1 12/24/2017   PLT PLATELET CLUMPS NOTED ON SMEAR, UNABLE TO ESTIMATE 12/24/2017   No results found for: "TSH"   Chemistry      Component Value Date/Time   NA 140 12/24/2017 0458   NA 142 12/18/2017 1500   K 3.9 12/24/2017 0458   CL 106 12/24/2017 0458   CO2 21 (L) 12/24/2017 0458   BUN 24 (H) 12/24/2017 0458   BUN 20 12/18/2017 1500   CREATININE 1.57 (H) 12/24/2017 0458   CREATININE 1.08 (H) 04/06/2015 1544      Component Value Date/Time   CALCIUM 8.9 12/24/2017 0458   ALKPHOS 98 05/30/2016 1256   AST 18 05/30/2016 1256   ALT 14 05/30/2016 1256   BILITOT 0.4 05/30/2016 1256         Total time spent on today's visit was ***30 minutes, including both face-to-face time and nonface-to-face time.  Time included that spent on review of records (prior notes available to me/labs/imaging if pertinent), discussing treatment and goals, answering patient's questions and coordinating care.  Cc:  Holland Commons, FNP

## 2022-03-08 ENCOUNTER — Ambulatory Visit: Payer: Medicare Other | Admitting: Neurology

## 2022-03-08 ENCOUNTER — Encounter: Payer: Self-pay | Admitting: Neurology

## 2022-03-08 VITALS — BP 136/90 | HR 64 | Ht 64.0 in | Wt 192.4 lb

## 2022-03-08 DIAGNOSIS — G250 Essential tremor: Secondary | ICD-10-CM | POA: Diagnosis not present

## 2022-03-12 ENCOUNTER — Ambulatory Visit: Payer: Medicare Other | Attending: Cardiology | Admitting: Cardiology

## 2022-03-12 ENCOUNTER — Encounter: Payer: Self-pay | Admitting: Cardiology

## 2022-03-12 VITALS — BP 136/82 | HR 55 | Ht 64.5 in | Wt 190.4 lb

## 2022-03-12 DIAGNOSIS — I2510 Atherosclerotic heart disease of native coronary artery without angina pectoris: Secondary | ICD-10-CM

## 2022-03-12 DIAGNOSIS — I714 Abdominal aortic aneurysm, without rupture, unspecified: Secondary | ICD-10-CM | POA: Diagnosis not present

## 2022-03-12 DIAGNOSIS — I10 Essential (primary) hypertension: Secondary | ICD-10-CM

## 2022-03-12 DIAGNOSIS — E7800 Pure hypercholesterolemia, unspecified: Secondary | ICD-10-CM | POA: Diagnosis not present

## 2022-03-12 NOTE — Patient Instructions (Signed)
  Testing/Procedures:  Your physician has requested that you have an abdominal aorta duplex. During this test, an ultrasound is used to evaluate the aorta. Allow 30 minutes for this exam. Do not eat after midnight the day before and avoid carbonated beverages [facility] [place]   Follow-Up: At [facility], you and your health needs are our priority.  As part of our continuing mission to provide you with exceptional heart care, we have created designated Provider Care Teams.  These Care Teams include your primary Cardiologist (physician) and Advanced Practice Providers (APPs -  Physician Assistants and Nurse Practitioners) who all work together to provide you with the care you need, when you need it.  We recommend signing up for the patient portal called "MyChart".  Sign up information is provided on this After Visit Summary.  MyChart is used to connect with patients for Virtual Visits (Telemedicine).  Patients are able to view lab/test results, encounter notes, upcoming appointments, etc.  Non-urgent messages can be sent to your provider as well.   To learn more about what you can do with MyChart, go to [email or web address].    Your next appointment:   12 month(s)  Provider:   Kirk Ruths, MD

## 2022-03-21 ENCOUNTER — Telehealth: Payer: Self-pay | Admitting: Cardiology

## 2022-03-21 DIAGNOSIS — I83891 Varicose veins of right lower extremities with other complications: Secondary | ICD-10-CM | POA: Diagnosis not present

## 2022-03-21 DIAGNOSIS — M79661 Pain in right lower leg: Secondary | ICD-10-CM | POA: Diagnosis not present

## 2022-03-21 DIAGNOSIS — R252 Cramp and spasm: Secondary | ICD-10-CM | POA: Diagnosis not present

## 2022-03-21 DIAGNOSIS — I2510 Atherosclerotic heart disease of native coronary artery without angina pectoris: Secondary | ICD-10-CM | POA: Diagnosis not present

## 2022-03-21 DIAGNOSIS — G2581 Restless legs syndrome: Secondary | ICD-10-CM | POA: Diagnosis not present

## 2022-03-21 NOTE — Telephone Encounter (Signed)
Yes, ok to take CoQ10

## 2022-03-21 NOTE — Telephone Encounter (Signed)
Gave patient the information that it is ok to take Coenzyme Q10, per pharmayc.  She states understanding.

## 2022-03-21 NOTE — Telephone Encounter (Signed)
Pt c/o medication issue:  1. Name of Medication:   Coenzyme Q10 (CO Q 10) 100 MG CAPS    2. How are you currently taking this medication (dosage and times per day)? Take 100 mg by mouth daily.   3. Are you having a reaction (difficulty breathing--STAT)? no  4. What is your medication issue? Pt would like a callback regarding above medication to find out if she should still be taking this while also taking Eliquis. Pt forgot to mention it to provider at her most recent office visit. Please advise.

## 2022-03-27 DIAGNOSIS — E7800 Pure hypercholesterolemia, unspecified: Secondary | ICD-10-CM | POA: Diagnosis not present

## 2022-03-27 DIAGNOSIS — Z0000 Encounter for general adult medical examination without abnormal findings: Secondary | ICD-10-CM | POA: Diagnosis not present

## 2022-03-27 DIAGNOSIS — R7303 Prediabetes: Secondary | ICD-10-CM | POA: Diagnosis not present

## 2022-03-27 DIAGNOSIS — R5383 Other fatigue: Secondary | ICD-10-CM | POA: Diagnosis not present

## 2022-03-27 LAB — LAB REPORT - SCANNED
A1c: 6
EGFR: 32

## 2022-04-19 DIAGNOSIS — I129 Hypertensive chronic kidney disease with stage 1 through stage 4 chronic kidney disease, or unspecified chronic kidney disease: Secondary | ICD-10-CM | POA: Diagnosis not present

## 2022-04-19 DIAGNOSIS — N184 Chronic kidney disease, stage 4 (severe): Secondary | ICD-10-CM | POA: Diagnosis not present

## 2022-04-19 DIAGNOSIS — N261 Atrophy of kidney (terminal): Secondary | ICD-10-CM | POA: Diagnosis not present

## 2022-04-26 DIAGNOSIS — H524 Presbyopia: Secondary | ICD-10-CM | POA: Diagnosis not present

## 2022-04-26 DIAGNOSIS — Z961 Presence of intraocular lens: Secondary | ICD-10-CM | POA: Diagnosis not present

## 2022-04-30 DIAGNOSIS — G2581 Restless legs syndrome: Secondary | ICD-10-CM | POA: Diagnosis not present

## 2022-04-30 DIAGNOSIS — I83811 Varicose veins of right lower extremities with pain: Secondary | ICD-10-CM | POA: Diagnosis not present

## 2022-04-30 DIAGNOSIS — I83891 Varicose veins of right lower extremities with other complications: Secondary | ICD-10-CM | POA: Diagnosis not present

## 2022-04-30 DIAGNOSIS — I8003 Phlebitis and thrombophlebitis of superficial vessels of lower extremities, bilateral: Secondary | ICD-10-CM | POA: Diagnosis not present

## 2022-04-30 DIAGNOSIS — R252 Cramp and spasm: Secondary | ICD-10-CM | POA: Diagnosis not present

## 2022-05-08 DIAGNOSIS — N184 Chronic kidney disease, stage 4 (severe): Secondary | ICD-10-CM | POA: Diagnosis not present

## 2022-05-08 DIAGNOSIS — I10 Essential (primary) hypertension: Secondary | ICD-10-CM | POA: Diagnosis not present

## 2022-05-08 DIAGNOSIS — M069 Rheumatoid arthritis, unspecified: Secondary | ICD-10-CM | POA: Diagnosis not present

## 2022-05-16 DIAGNOSIS — I83891 Varicose veins of right lower extremities with other complications: Secondary | ICD-10-CM | POA: Diagnosis not present

## 2022-05-22 DIAGNOSIS — Z09 Encounter for follow-up examination after completed treatment for conditions other than malignant neoplasm: Secondary | ICD-10-CM | POA: Diagnosis not present

## 2022-05-23 DIAGNOSIS — J439 Emphysema, unspecified: Secondary | ICD-10-CM | POA: Diagnosis not present

## 2022-05-23 DIAGNOSIS — E559 Vitamin D deficiency, unspecified: Secondary | ICD-10-CM | POA: Diagnosis not present

## 2022-05-23 DIAGNOSIS — N261 Atrophy of kidney (terminal): Secondary | ICD-10-CM | POA: Diagnosis not present

## 2022-05-23 DIAGNOSIS — I700 Atherosclerosis of aorta: Secondary | ICD-10-CM | POA: Diagnosis not present

## 2022-05-23 DIAGNOSIS — N184 Chronic kidney disease, stage 4 (severe): Secondary | ICD-10-CM | POA: Diagnosis not present

## 2022-05-23 DIAGNOSIS — Z0000 Encounter for general adult medical examination without abnormal findings: Secondary | ICD-10-CM | POA: Diagnosis not present

## 2022-05-23 DIAGNOSIS — I2510 Atherosclerotic heart disease of native coronary artery without angina pectoris: Secondary | ICD-10-CM | POA: Diagnosis not present

## 2022-05-23 DIAGNOSIS — I77811 Abdominal aortic ectasia: Secondary | ICD-10-CM | POA: Diagnosis not present

## 2022-05-23 DIAGNOSIS — I10 Essential (primary) hypertension: Secondary | ICD-10-CM | POA: Diagnosis not present

## 2022-05-23 DIAGNOSIS — E7800 Pure hypercholesterolemia, unspecified: Secondary | ICD-10-CM | POA: Diagnosis not present

## 2022-05-29 ENCOUNTER — Other Ambulatory Visit: Payer: Self-pay | Admitting: Registered Nurse

## 2022-05-29 DIAGNOSIS — R921 Mammographic calcification found on diagnostic imaging of breast: Secondary | ICD-10-CM

## 2022-06-05 DIAGNOSIS — I83891 Varicose veins of right lower extremities with other complications: Secondary | ICD-10-CM | POA: Diagnosis not present

## 2022-06-05 DIAGNOSIS — M7989 Other specified soft tissue disorders: Secondary | ICD-10-CM | POA: Diagnosis not present

## 2022-06-05 DIAGNOSIS — I83811 Varicose veins of right lower extremities with pain: Secondary | ICD-10-CM | POA: Diagnosis not present

## 2022-06-08 DIAGNOSIS — N184 Chronic kidney disease, stage 4 (severe): Secondary | ICD-10-CM | POA: Diagnosis not present

## 2022-06-08 DIAGNOSIS — M069 Rheumatoid arthritis, unspecified: Secondary | ICD-10-CM | POA: Diagnosis not present

## 2022-06-08 DIAGNOSIS — I10 Essential (primary) hypertension: Secondary | ICD-10-CM | POA: Diagnosis not present

## 2022-06-12 DIAGNOSIS — D1801 Hemangioma of skin and subcutaneous tissue: Secondary | ICD-10-CM | POA: Diagnosis not present

## 2022-06-12 DIAGNOSIS — D692 Other nonthrombocytopenic purpura: Secondary | ICD-10-CM | POA: Diagnosis not present

## 2022-06-12 DIAGNOSIS — L570 Actinic keratosis: Secondary | ICD-10-CM | POA: Diagnosis not present

## 2022-06-12 DIAGNOSIS — L821 Other seborrheic keratosis: Secondary | ICD-10-CM | POA: Diagnosis not present

## 2022-06-12 DIAGNOSIS — D485 Neoplasm of uncertain behavior of skin: Secondary | ICD-10-CM | POA: Diagnosis not present

## 2022-06-19 DIAGNOSIS — I83891 Varicose veins of right lower extremities with other complications: Secondary | ICD-10-CM | POA: Diagnosis not present

## 2022-06-19 DIAGNOSIS — I87391 Chronic venous hypertension (idiopathic) with other complications of right lower extremity: Secondary | ICD-10-CM | POA: Diagnosis not present

## 2022-06-20 ENCOUNTER — Other Ambulatory Visit: Payer: Self-pay | Admitting: Registered Nurse

## 2022-06-20 DIAGNOSIS — J439 Emphysema, unspecified: Secondary | ICD-10-CM

## 2022-06-29 ENCOUNTER — Ambulatory Visit
Admission: RE | Admit: 2022-06-29 | Discharge: 2022-06-29 | Disposition: A | Discharge status: Home / Self Care | Payer: Medicare Other | Source: Ambulatory Visit | Attending: Registered Nurse | Admitting: Registered Nurse

## 2022-06-29 DIAGNOSIS — J439 Emphysema, unspecified: Secondary | ICD-10-CM

## 2022-06-29 DIAGNOSIS — Z87891 Personal history of nicotine dependence: Secondary | ICD-10-CM | POA: Diagnosis not present

## 2022-07-04 DIAGNOSIS — I87391 Chronic venous hypertension (idiopathic) with other complications of right lower extremity: Secondary | ICD-10-CM | POA: Diagnosis not present

## 2022-07-04 DIAGNOSIS — I83891 Varicose veins of right lower extremities with other complications: Secondary | ICD-10-CM | POA: Diagnosis not present

## 2022-07-18 ENCOUNTER — Ambulatory Visit (HOSPITAL_COMMUNITY)
Admission: RE | Admit: 2022-07-18 | Discharge: 2022-07-18 | Disposition: A | Discharge status: Home / Self Care | Payer: Medicare Other | Source: Ambulatory Visit | Attending: Cardiology | Admitting: Cardiology

## 2022-07-18 ENCOUNTER — Encounter: Payer: Self-pay | Admitting: Cardiology

## 2022-07-18 DIAGNOSIS — I714 Abdominal aortic aneurysm, without rupture, unspecified: Secondary | ICD-10-CM | POA: Insufficient documentation

## 2022-07-26 ENCOUNTER — Ambulatory Visit
Admission: RE | Admit: 2022-07-26 | Discharge: 2022-07-26 | Disposition: A | Discharge status: Home / Self Care | Payer: Medicare Other | Source: Ambulatory Visit | Attending: Registered Nurse | Admitting: Registered Nurse

## 2022-07-26 DIAGNOSIS — R921 Mammographic calcification found on diagnostic imaging of breast: Secondary | ICD-10-CM

## 2022-07-27 ENCOUNTER — Other Ambulatory Visit: Payer: Self-pay | Admitting: Registered Nurse

## 2022-07-27 DIAGNOSIS — R921 Mammographic calcification found on diagnostic imaging of breast: Secondary | ICD-10-CM

## 2022-08-21 DIAGNOSIS — M79641 Pain in right hand: Secondary | ICD-10-CM | POA: Diagnosis not present

## 2022-08-22 DIAGNOSIS — H348322 Tributary (branch) retinal vein occlusion, left eye, stable: Secondary | ICD-10-CM | POA: Diagnosis not present

## 2022-08-29 DIAGNOSIS — N1832 Chronic kidney disease, stage 3b: Secondary | ICD-10-CM | POA: Diagnosis not present

## 2022-08-29 DIAGNOSIS — M5136 Other intervertebral disc degeneration, lumbar region: Secondary | ICD-10-CM | POA: Diagnosis not present

## 2022-08-29 DIAGNOSIS — M797 Fibromyalgia: Secondary | ICD-10-CM | POA: Diagnosis not present

## 2022-08-29 DIAGNOSIS — M1991 Primary osteoarthritis, unspecified site: Secondary | ICD-10-CM | POA: Diagnosis not present

## 2022-08-29 DIAGNOSIS — M0579 Rheumatoid arthritis with rheumatoid factor of multiple sites without organ or systems involvement: Secondary | ICD-10-CM | POA: Diagnosis not present

## 2022-08-29 DIAGNOSIS — M2550 Pain in unspecified joint: Secondary | ICD-10-CM | POA: Diagnosis not present

## 2022-09-18 DIAGNOSIS — N261 Atrophy of kidney (terminal): Secondary | ICD-10-CM | POA: Diagnosis not present

## 2022-09-18 DIAGNOSIS — N184 Chronic kidney disease, stage 4 (severe): Secondary | ICD-10-CM | POA: Diagnosis not present

## 2022-09-18 DIAGNOSIS — I129 Hypertensive chronic kidney disease with stage 1 through stage 4 chronic kidney disease, or unspecified chronic kidney disease: Secondary | ICD-10-CM | POA: Diagnosis not present

## 2022-09-24 DIAGNOSIS — R400 Somnolence: Secondary | ICD-10-CM | POA: Diagnosis not present

## 2022-09-24 DIAGNOSIS — G4733 Obstructive sleep apnea (adult) (pediatric): Secondary | ICD-10-CM | POA: Diagnosis not present

## 2022-10-25 DIAGNOSIS — H348322 Tributary (branch) retinal vein occlusion, left eye, stable: Secondary | ICD-10-CM | POA: Diagnosis not present

## 2022-11-06 DIAGNOSIS — I872 Venous insufficiency (chronic) (peripheral): Secondary | ICD-10-CM | POA: Diagnosis not present

## 2022-11-06 DIAGNOSIS — I87391 Chronic venous hypertension (idiopathic) with other complications of right lower extremity: Secondary | ICD-10-CM | POA: Diagnosis not present

## 2022-11-16 DIAGNOSIS — R7303 Prediabetes: Secondary | ICD-10-CM | POA: Diagnosis not present

## 2022-11-16 DIAGNOSIS — E559 Vitamin D deficiency, unspecified: Secondary | ICD-10-CM | POA: Diagnosis not present

## 2022-11-16 DIAGNOSIS — E7800 Pure hypercholesterolemia, unspecified: Secondary | ICD-10-CM | POA: Diagnosis not present

## 2022-11-17 LAB — LAB REPORT - SCANNED
A1c: 6.1
EGFR: 28

## 2022-11-23 DIAGNOSIS — E559 Vitamin D deficiency, unspecified: Secondary | ICD-10-CM | POA: Diagnosis not present

## 2022-11-23 DIAGNOSIS — E7800 Pure hypercholesterolemia, unspecified: Secondary | ICD-10-CM | POA: Diagnosis not present

## 2022-11-23 DIAGNOSIS — T466X5A Adverse effect of antihyperlipidemic and antiarteriosclerotic drugs, initial encounter: Secondary | ICD-10-CM | POA: Diagnosis not present

## 2022-11-23 DIAGNOSIS — I77811 Abdominal aortic ectasia: Secondary | ICD-10-CM | POA: Diagnosis not present

## 2022-11-23 DIAGNOSIS — I10 Essential (primary) hypertension: Secondary | ICD-10-CM | POA: Diagnosis not present

## 2022-11-23 DIAGNOSIS — J439 Emphysema, unspecified: Secondary | ICD-10-CM | POA: Diagnosis not present

## 2022-11-23 DIAGNOSIS — I2510 Atherosclerotic heart disease of native coronary artery without angina pectoris: Secondary | ICD-10-CM | POA: Diagnosis not present

## 2022-11-23 DIAGNOSIS — N184 Chronic kidney disease, stage 4 (severe): Secondary | ICD-10-CM | POA: Diagnosis not present

## 2022-11-23 DIAGNOSIS — R7303 Prediabetes: Secondary | ICD-10-CM | POA: Diagnosis not present

## 2022-11-23 DIAGNOSIS — G720 Drug-induced myopathy: Secondary | ICD-10-CM | POA: Diagnosis not present

## 2022-11-23 DIAGNOSIS — Z23 Encounter for immunization: Secondary | ICD-10-CM | POA: Diagnosis not present

## 2022-11-23 DIAGNOSIS — I700 Atherosclerosis of aorta: Secondary | ICD-10-CM | POA: Diagnosis not present

## 2022-11-26 ENCOUNTER — Institutional Professional Consult (permissible substitution): Payer: Medicare Other | Admitting: Nurse Practitioner

## 2022-12-04 ENCOUNTER — Ambulatory Visit
Admission: RE | Admit: 2022-12-04 | Discharge: 2022-12-04 | Disposition: A | Discharge status: Home / Self Care | Payer: Medicare Other | Source: Ambulatory Visit | Attending: Registered Nurse | Admitting: Registered Nurse

## 2022-12-04 DIAGNOSIS — R921 Mammographic calcification found on diagnostic imaging of breast: Secondary | ICD-10-CM

## 2023-01-23 ENCOUNTER — Encounter: Payer: Self-pay | Admitting: Nurse Practitioner

## 2023-01-23 ENCOUNTER — Ambulatory Visit: Payer: Medicare Other | Admitting: Nurse Practitioner

## 2023-01-23 VITALS — BP 130/80 | HR 60 | Ht 63.0 in | Wt 187.2 lb

## 2023-01-23 DIAGNOSIS — E66811 Obesity, class 1: Secondary | ICD-10-CM | POA: Diagnosis not present

## 2023-01-23 DIAGNOSIS — G4719 Other hypersomnia: Secondary | ICD-10-CM

## 2023-01-23 DIAGNOSIS — G4733 Obstructive sleep apnea (adult) (pediatric): Secondary | ICD-10-CM

## 2023-01-23 DIAGNOSIS — R269 Unspecified abnormalities of gait and mobility: Secondary | ICD-10-CM

## 2023-01-23 NOTE — Progress Notes (Signed)
@Patient  ID: Janet Johnston, female    DOB: [birth year], 73 y.o.   MRN: [id number]  Chief Complaint  Patient presents with   Consult    Sleep Consult    Referring provider: Fatima Sanger, FNP  HPI: 73 year old female, former smoker referred for sleep consult. Past medical history significant for AAA, CAD, HTN, statin myopathy, CKD, HLD.  TEST/EVENTS:   01/23/2023: Today - sleep consult Patient presents today for sleep consult, referred by Dr. Larose Hires.  Has difficulties with sleep at night and wakes frequently to use the bathroom Does have some sleep talking but no sleep walking  Never been told she snores  Some occasional morning headaches  No drowsy driving  Has a new puppy, which has caused more disruptions in her sleep but she had difficulties long before this. Had a home sleep study in 2018. She was told she had sleep apnea but she didn't feel it was accurate. She denies drowsy driving, sleep paralysis.  She goes to bed between 10pm-midnight. Most nights falls asleep within 30 minutes but sometimes it can take longer. Wakes frequently. Gets up around 8-9 am. She is retired. Weight is down 10 lb in the last two years. Not on CPAP. No oxygen use.  She has a history of HTN. Was told she had, had two strokes before by neurology but she was unaware of this and has no residual effects. She does have some issues with her balance, which is why she went to see the neurologist. They have not found anything remarkable aside from an essential tremor. No falls. She has had a cardiac stent before.  She is a former smoker. Quit in 2019. Does not drink alcohol. Has 1 cup of coffee in AM. No sleep aids. Lives alone; widowed. Retired.   Epworth 9  Allergies  Allergen Reactions   Atorvastatin Other (See Comments)   Iodinated Contrast Media    Rosuvastatin Other (See Comments)   Zetia [Ezetimibe] Diarrhea   Lyrica [Pregabalin] Itching and Rash   Penicillins Itching and Rash    Has  patient had a PCN reaction causing immediate rash, facial/tongue/throat swelling, SOB or lightheadedness with hypotension: No Has patient had a PCN reaction causing severe rash involving mucus membranes or skin necrosis: No Has patient had a PCN reaction that required hospitalization: No Has patient had a PCN reaction occurring within the last 10 years: No If all of the above answers are "NO", then may proceed with Cephalosporin use.    Sulfa Antibiotics Itching and Rash    Immunization History  Administered Date(s) Administered   PFIZER(Purple Top)SARS-COV-2 Vaccination 03/01/2019, 03/25/2019   Tdap 09/10/2013    Past Medical History:  Diagnosis Date   Anxiety    Asthmatic bronchitis    Chronic bronchitis (HCC)    "get it q yr; we head w/wood stove" (12/23/2017)   Chronic kidney disease    "Creatinine level elevated last couple years" (12/23/2017)   Chronic lower back pain    COPD (chronic obstructive pulmonary disease) (HCC)    Coronary artery disease    a. LHC 12/23/2017: pLAD to mLAD 95% s/p DES   Depression    DJD (degenerative joint disease)    Fibromyalgia    GERD (gastroesophageal reflux disease)    History of hiatal hernia    History of kidney stones    Hyperlipidemia    Hypertension    Pneumonia    Prediabetes    Rheumatoid arthritis (HCC)    Sleep apnea    "  home test indicated that I had it; I've never used mask" (12/23/2017)   Tension headache    "controlled" (12/23/2017)    Tobacco History: Social History   Tobacco Use  Smoking Status Former   Current packs/day: 0.50   Types: Cigarettes  Smokeless Tobacco Never  Tobacco Comments   Stopped smoking 4 years ago    Counseling given: Not Answered Tobacco comments: Stopped smoking 4 years ago    Outpatient Medications Prior to Visit  Medication Sig Dispense Refill   bisoprolol (ZEBETA) 10 MG tablet Take 10 mg by mouth daily.     ELIQUIS 5 MG TABS tablet Take 5 mg by mouth 2 (two) times daily.      ergocalciferol (VITAMIN D2) 50000 UNITS capsule Take 50,000 Units by mouth once a week.      FLUoxetine (PROZAC) 20 MG capsule Take 40 mg by mouth every morning.     losartan (COZAAR) 50 MG tablet Take 100 mg by mouth daily.     Magnesium 250 MG TABS 3x weekly     Multiple Vitamins-Minerals (CENTRUM ADULTS PO) Take 1 tablet by mouth daily.     nitroGLYCERIN (NITROSTAT) 0.4 MG SL tablet DISSOLVE 1 TABLET UNDER THE TONGUE EVERY 5 MINUTES FOR UP TO 3 DOSES AS NEEDED FOR CHEST PAIN. IF NO RELIEF AFTER 3 DOSES, CALL 911 OR GO TO ER. 25 tablet 2   predniSONE (DELTASONE) 5 MG tablet Take 5 mg by mouth daily with breakfast.     REPATHA SURECLICK 140 MG/ML SOAJ Inject 1 Syringe into the skin every 14 (fourteen) days.     Ascorbic Acid (VITAMIN C) 1000 MG tablet 1 tablet     Coenzyme Q10 (CO Q 10) 100 MG CAPS Take 100 mg by mouth daily.      No facility-administered medications prior to visit.     Review of Systems:   Constitutional: No night sweats, fevers, chills, or lassitude. +intentional weight loss, fatigue  HEENT: No difficulty swallowing, tooth/dental problems, or sore throat. No sneezing, itching, ear ache, nasal congestion, or post nasal drip. +morning headaches  CV:  No chest pain, orthopnea, PND, swelling in lower extremities, anasarca, dizziness, palpitations, syncope Resp: +baseline shortness of breath with exertion. No excess mucus or change in color of mucus. No productive or non-productive. No hemoptysis. No wheezing.  No chest wall deformity GI:  No heartburn, indigestion, abdominal pain, nausea, vomiting, diarrhea, change in bowel habits, loss of appetite, bloody stools.  GU: No dysuria, change in color of urine, urgency or frequency.  +nocturia Skin: No rash, lesions, ulcerations MSK:  No joint pain or swelling.  +joint stiffness (RA) Neuro: +gait changes, tremor. No memory impairment  Psych: No depression or anxiety. Mood stable. +sleep disturbance     Physical Exam:  BP  130/80 (BP Location: Right Arm, Cuff Size: Large)   Pulse 60   Ht 5\' 3"  (1.6 m)   Wt 187 lb 3.2 oz (84.9 kg)   SpO2 94%   BMI 33.16 kg/m   GEN: Pleasant, interactive, well-appearing; obese; in no acute distress HEENT:  Normocephalic and atraumatic. PERRLA. Sclera white. Nasal turbinates pink, moist and patent bilaterally. No rhinorrhea present. Oropharynx pink and moist, without exudate or edema. No lesions, ulcerations, or postnasal drip. Mallampati III NECK:  Supple w/ fair ROM. No JVD present. Normal carotid impulses w/o bruits. Thyroid symmetrical with no goiter or nodules palpated. No lymphadenopathy.   CV: RRR, no m/r/g, no peripheral edema. Pulses intact, +2 bilaterally. No cyanosis, pallor or  clubbing. PULMONARY:  Unlabored, regular breathing. Clear bilaterally A&P w/o wheezes/rales/rhonchi. No accessory muscle use.  GI: BS present and normoactive. Soft, non-tender to palpation. No organomegaly or masses detected.  MSK: No erythema, warmth or tenderness. Cap refil <2 sec all extrem. No deformities or joint swelling noted.  Neuro: A/Ox3. No focal deficits noted.   Skin: Warm, no lesions or rashe Psych: Normal affect and behavior. Judgement and thought content appropriate.     Lab Results:  CBC    Component Value Date/Time   WBC 8.7 12/24/2017 0458   RBC 4.44 12/24/2017 0458   HGB 12.5 12/24/2017 0458   HGB 12.7 12/18/2017 1500   HCT 39.1 12/24/2017 0458   HCT 38.9 12/18/2017 1500   PLT PLATELET CLUMPS NOTED ON SMEAR, UNABLE TO ESTIMATE 12/24/2017 0458   PLT 238 12/18/2017 1500   MCV 88.1 12/24/2017 0458   MCV 87 12/18/2017 1500   MCH 28.2 12/24/2017 0458   MCHC 32.0 12/24/2017 0458   RDW 13.1 12/24/2017 0458   RDW 13.0 12/18/2017 1500    BMET    Component Value Date/Time   NA 140 12/24/2017 0458   NA 142 12/18/2017 1500   K 3.9 12/24/2017 0458   CL 106 12/24/2017 0458   CO2 21 (L) 12/24/2017 0458   GLUCOSE 78 12/24/2017 0458   BUN 24 (H) 12/24/2017 0458    BUN 20 12/18/2017 1500   CREATININE 1.57 (H) 12/24/2017 0458   CREATININE 1.08 (H) 04/06/2015 1544   CALCIUM 8.9 12/24/2017 0458   GFRNONAA 34 (L) 12/24/2017 0458   GFRAA 39 (L) 12/24/2017 0458    BNP No results found for: "BNP"   Imaging:  No results found.  Administration History     None           No data to display          No results found for: "NITRICOXIDE"      Assessment & Plan:   Excessive daytime sleepiness She has excessive daytime sleepiness, restless sleep, morning headaches. BMI 33. History of HTN, TIA/CVA. Epworth 9. Given this,  I am concerned she could have sleep disordered breathing with obstructive sleep apnea. She will need sleep study for further evaluation.    - discussed how weight can impact sleep and risk for sleep disordered breathing - discussed options to assist with weight loss: combination of diet modification, cardiovascular and strength training exercises   - had an extensive discussion regarding the adverse health consequences related to untreated sleep disordered breathing - specifically discussed the risks for hypertension, coronary artery disease, cardiac dysrhythmias, cerebrovascular disease, and diabetes - lifestyle modification discussed   - discussed how sleep disruption can increase risk of accidents, particularly when driving - safe driving practices were discussed  Patient Instructions  Given your symptoms, I am concerned that you may have sleep disordered breathing with sleep apnea. You will need a sleep study for further evaluation. Someone will contact you to schedule this.   We discussed how untreated sleep apnea puts an individual at risk for cardiac arrhthymias, pulm HTN, DM, stroke and increases their risk for daytime accidents. We also briefly reviewed treatment options including weight loss, side sleeping position, oral appliance, CPAP therapy or referral to ENT for possible surgical options  Use caution when  driving and pull over if you become sleepy.  Follow up in 6 weeks with Florentina Addison Kylor Valverde,NP to go over sleep study results, or sooner, if needed     OSA (obstructive sleep apnea) See  above  Obesity (BMI 30.0-34.9) BMI 33. Healthy weight loss encouraged  Gait abnormality Follow up with neurology as scheduled   Advised if symptoms do not improve or worsen, to please contact office for sooner follow up or seek emergency care.   I spent 45 minutes of dedicated to the care of this patient on the date of this encounter to include pre-visit review of records, face-to-face time with the patient discussing conditions above, post visit ordering of testing, clinical documentation with the electronic health record, making appropriate referrals as documented, and communicating necessary findings to members of the patients care team.  Noemi Chapel, NP 01/24/2023  Pt aware and understands NP's role.

## 2023-01-23 NOTE — Patient Instructions (Signed)
 Given your symptoms, I am concerned that you may have sleep disordered breathing with sleep apnea. You will need a sleep study for further evaluation. Someone will contact you to schedule this.   We discussed how untreated sleep apnea puts an individual at risk for cardiac arrhthymias, pulm HTN, DM, stroke and increases their risk for daytime accidents. We also briefly reviewed treatment options including weight loss, side sleeping position, oral appliance, CPAP therapy or referral to ENT for possible surgical options  Use caution when driving and pull over if you become sleepy.  Follow up in 6 weeks with Janet Nija Koopman,NP to go over sleep study results, or sooner, if needed

## 2023-01-24 ENCOUNTER — Encounter: Payer: Self-pay | Admitting: Nurse Practitioner

## 2023-01-24 DIAGNOSIS — G4733 Obstructive sleep apnea (adult) (pediatric): Secondary | ICD-10-CM | POA: Insufficient documentation

## 2023-01-24 DIAGNOSIS — E66811 Obesity, class 1: Secondary | ICD-10-CM | POA: Insufficient documentation

## 2023-01-24 DIAGNOSIS — R269 Unspecified abnormalities of gait and mobility: Secondary | ICD-10-CM | POA: Insufficient documentation

## 2023-01-24 DIAGNOSIS — G4719 Other hypersomnia: Secondary | ICD-10-CM | POA: Insufficient documentation

## 2023-01-24 NOTE — Assessment & Plan Note (Signed)
She has excessive daytime sleepiness, restless sleep, morning headaches. BMI 33. History of HTN, TIA/CVA. Epworth 9. Given this,  I am concerned she could have sleep disordered breathing with obstructive sleep apnea. She will need sleep study for further evaluation.    - discussed how weight can impact sleep and risk for sleep disordered breathing - discussed options to assist with weight loss: combination of diet modification, cardiovascular and strength training exercises   - had an extensive discussion regarding the adverse health consequences related to untreated sleep disordered breathing - specifically discussed the risks for hypertension, coronary artery disease, cardiac dysrhythmias, cerebrovascular disease, and diabetes - lifestyle modification discussed   - discussed how sleep disruption can increase risk of accidents, particularly when driving - safe driving practices were discussed  Patient Instructions  Given your symptoms, I am concerned that you may have sleep disordered breathing with sleep apnea. You will need a sleep study for further evaluation. Someone will contact you to schedule this.   We discussed how untreated sleep apnea puts an individual at risk for cardiac arrhthymias, pulm HTN, DM, stroke and increases their risk for daytime accidents. We also briefly reviewed treatment options including weight loss, side sleeping position, oral appliance, CPAP therapy or referral to ENT for possible surgical options  Use caution when driving and pull over if you become sleepy.  Follow up in 6 weeks with Janet Rosea Dory,NP to go over sleep study results, or sooner, if needed

## 2023-01-24 NOTE — Assessment & Plan Note (Signed)
Follow up with neurology as scheduled.

## 2023-01-24 NOTE — Assessment & Plan Note (Signed)
 BMI 33. Healthy weight loss encouraged

## 2023-01-24 NOTE — Assessment & Plan Note (Signed)
See above

## 2023-02-17 DIAGNOSIS — G4719 Other hypersomnia: Secondary | ICD-10-CM

## 2023-02-17 DIAGNOSIS — G4730 Sleep apnea, unspecified: Secondary | ICD-10-CM | POA: Diagnosis not present

## 2023-02-17 DIAGNOSIS — G4733 Obstructive sleep apnea (adult) (pediatric): Secondary | ICD-10-CM

## 2023-03-03 DIAGNOSIS — G4739 Other sleep apnea: Secondary | ICD-10-CM | POA: Diagnosis not present

## 2023-03-03 DIAGNOSIS — R069 Unspecified abnormalities of breathing: Secondary | ICD-10-CM | POA: Diagnosis not present

## 2023-03-05 ENCOUNTER — Telehealth: Payer: Self-pay | Admitting: Nurse Practitioner

## 2023-03-05 ENCOUNTER — Telehealth: Payer: Self-pay

## 2023-03-05 NOTE — Telephone Encounter (Signed)
 Patient mailed back home sleep test 02/20/2023. Patient phone number is [phone].

## 2023-03-05 NOTE — Telephone Encounter (Signed)
 LVM to see if pt ever got sleep study done for her appointment tomorrow with Florentina Addison

## 2023-03-06 ENCOUNTER — Encounter: Payer: Self-pay | Admitting: Nurse Practitioner

## 2023-03-06 ENCOUNTER — Ambulatory Visit: Payer: Medicare Other | Admitting: Nurse Practitioner

## 2023-03-06 VITALS — BP 125/74 | HR 60 | Temp 96.9°F | Ht 63.0 in | Wt 186.0 lb

## 2023-03-06 DIAGNOSIS — Z6832 Body mass index (BMI) 32.0-32.9, adult: Secondary | ICD-10-CM | POA: Diagnosis not present

## 2023-03-06 DIAGNOSIS — E66811 Obesity, class 1: Secondary | ICD-10-CM

## 2023-03-06 DIAGNOSIS — G4733 Obstructive sleep apnea (adult) (pediatric): Secondary | ICD-10-CM | POA: Diagnosis not present

## 2023-03-06 NOTE — Patient Instructions (Addendum)
 Start CPAP auto 5-15 cmH2O, nasal mask, heated humidity, every night, minimum of 4-6 hours a night.  Change equipment as directed. Wash your tubing with warm soap and water daily, hang to dry. Wash humidifier portion weekly. Use bottled, distilled water and change daily Be aware of reduced alertness and do not drive or operate heavy machinery if experiencing this or drowsiness.  Exercise encouraged, as tolerated. Healthy weight management discussed.  Avoid or decrease alcohol consumption and medications that make you more sleepy, if possible. Notify if persistent daytime sleepiness occurs even with consistent use of PAP therapy.  We discussed how untreated sleep apnea puts an individual at risk for cardiac arrhthymias, pulm HTN, DM, stroke and increases their risk for daytime accidents. We also briefly reviewed treatment options including weight loss, side sleeping position, oral appliance, CPAP therapy or referral to ENT for possible surgical options  Change supplies.... Every month Mask cushions and/or nasal pillows CPAP machine filters Every 3 months Mask frame (not including the headgear) CPAP tubing Every 6 months Mask headgear Chin strap (if applicable) Humidifier water tub  Follow up in 6 weeks with Katie Armend Hochstatter,NP to see how CPAP is going, or sooner, if needed

## 2023-03-06 NOTE — Telephone Encounter (Signed)
 Patient was seen in clinic today.

## 2023-03-06 NOTE — Progress Notes (Signed)
 @Patient  ID: Janet Johnston, female    DOB: 01/16/1950, 73 y.o.   MRN: [id number]  Chief Complaint  Patient presents with   Follow-up    Referring provider: Fatima Sanger, FNP  HPI: 73 year old female, former smoker referred for sleep consult. Past medical history significant for AAA, CAD, HTN, statin myopathy, CKD, HLD.  TEST/EVENTS:  02/17/2023 HST: AHI 66.8/h, CAI 16/h (24%), SpO2 low 76%  01/23/2023: OV with Zainab Crumrine NP for sleep consult, referred by Dr. Larose Hires.  Has difficulties with sleep at night and wakes frequently to use the bathroom Does have some sleep talking but no sleep walking  Never been told she snores  Some occasional morning headaches  No drowsy driving  Has a new puppy, which has caused more disruptions in her sleep but she had difficulties long before this. Had a home sleep study in 2018. She was told she had sleep apnea but she didn't feel it was accurate. She denies drowsy driving, sleep paralysis.  She goes to bed between 10pm-midnight. Most nights falls asleep within 30 minutes but sometimes it can take longer. Wakes frequently. Gets up around 8-9 am. She is retired. Weight is down 10 lb in the last two years. Not on CPAP. No oxygen use.  She has a history of HTN. Was told she had, had two strokes before by neurology but she was unaware of this and has no residual effects. She does have some issues with her balance, which is why she went to see the neurologist. They have not found anything remarkable aside from an essential tremor. No falls. She has had a cardiac stent before.  She is a former smoker. Quit in 2019. Does not drink alcohol. Has 1 cup of coffee in AM. No sleep aids. Lives alone; widowed. Retired.  [place] 9  03/06/2023: Today - follow up Patient presents today for follow-up after undergoing home sleep study which revealed severe sleep apnea.  She had a total AHI of 66.8/h with 24% central events.  She feels unchanged compared to her last  visit.  Has a lot of disruptions in her sleep at night.  Feels tired during the day.  Energy levels are not the best.  Does have frequent nighttime urination.  Denies any issues with drowsy driving.  No history of sleepwalking. Wants to discuss treatment options.   Allergies  Allergen Reactions   Atorvastatin Other (See Comments)   Iodinated Contrast Media    Rosuvastatin Other (See Comments)   Zetia [Ezetimibe] Diarrhea   Lyrica [Pregabalin] Itching and Rash   Penicillins Itching and Rash    Has patient had a PCN reaction causing immediate rash, facial/tongue/throat swelling, SOB or lightheadedness with hypotension: No Has patient had a PCN reaction causing severe rash involving mucus membranes or skin necrosis: No Has patient had a PCN reaction that required hospitalization: No Has patient had a PCN reaction occurring within the last 10 years: No If all of the above answers are "NO", then may proceed with Cephalosporin use.    Sulfa Antibiotics Itching and Rash    Immunization History  Administered Date(s) Administered   PFIZER(Purple Top)SARS-COV-2 Vaccination 03/01/2019, 03/25/2019   Tdap 09/10/2013    Past Medical History:  Diagnosis Date   Anxiety    Asthmatic bronchitis    Chronic bronchitis (HCC)    "get it q yr; we head w/wood stove" (12/23/2017)   Chronic kidney disease    "Creatinine level elevated last couple years" (12/23/2017)   Chronic lower  back pain    COPD (chronic obstructive pulmonary disease) (HCC)    Coronary artery disease    a. LHC 12/23/2017: pLAD to mLAD 95% s/p DES   Depression    DJD (degenerative joint disease)    Fibromyalgia    GERD (gastroesophageal reflux disease)    History of hiatal hernia    History of kidney stones    Hyperlipidemia    Hypertension    Pneumonia    Prediabetes    Rheumatoid arthritis (HCC)    Sleep apnea    "home test indicated that I had it; I've never used mask" (12/23/2017)   Tension headache    "controlled"  (12/23/2017)    Tobacco History: Social History   Tobacco Use  Smoking Status Former   Current packs/day: 0.50   Types: Cigarettes  Smokeless Tobacco Never  Tobacco Comments   Stopped smoking 4 years ago    Counseling given: Not Answered Tobacco comments: Stopped smoking 4 years ago    Outpatient Medications Prior to Visit  Medication Sig Dispense Refill   bisoprolol (ZEBETA) 10 MG tablet Take 10 mg by mouth daily.     ergocalciferol (VITAMIN D2) 50000 UNITS capsule Take 50,000 Units by mouth once a week.      FLUoxetine (PROZAC) 20 MG capsule Take 40 mg by mouth every morning.     losartan (COZAAR) 50 MG tablet Take 100 mg by mouth daily.     Magnesium 250 MG TABS 3x weekly     Multiple Vitamins-Minerals (CENTRUM ADULTS PO) Take 1 tablet by mouth daily.     nitroGLYCERIN (NITROSTAT) 0.4 MG SL tablet DISSOLVE 1 TABLET UNDER THE TONGUE EVERY 5 MINUTES FOR UP TO 3 DOSES AS NEEDED FOR CHEST PAIN. IF NO RELIEF AFTER 3 DOSES, CALL 911 OR GO TO ER. 25 tablet 2   predniSONE (DELTASONE) 5 MG tablet Take 5 mg by mouth daily with breakfast.     REPATHA SURECLICK 140 MG/ML SOAJ Inject 1 Syringe into the skin every 14 (fourteen) days.     ELIQUIS 5 MG TABS tablet Take 5 mg by mouth 2 (two) times daily.     No facility-administered medications prior to visit.     Review of Systems:   Constitutional: No night sweats, fevers, chills, or lassitude. +intentional weight loss, fatigue  HEENT: No difficulty swallowing, tooth/dental problems, or sore throat. No sneezing, itching, ear ache, nasal congestion, or post nasal drip. +morning headaches  CV:  No chest pain, orthopnea, PND, swelling in lower extremities, anasarca, dizziness, palpitations, syncope Resp: +baseline shortness of breath with exertion. No excess mucus or change in color of mucus. No productive or non-productive. No hemoptysis. No wheezing.  No chest wall deformity GI:  No heartburn, indigestion, abdominal pain, nausea,  vomiting, diarrhea, change in bowel habits, loss of appetite, bloody stools.  GU: No dysuria, change in color of urine, urgency or frequency.  +nocturia Skin: No rash, lesions, ulcerations MSK:  No joint pain or swelling.  +joint stiffness (RA) Neuro: +gait changes, tremor. No memory impairment  Psych: No depression or anxiety. Mood stable. +sleep disturbance     Physical Exam:  BP 125/74 (BP Location: Right Arm, Patient Position: Sitting, Cuff Size: Large)   Pulse 60   Temp (!) 96.9 F (36.1 C) (Temporal)   Ht 5\' 3"  (1.6 m)   Wt 186 lb (84.4 kg)   SpO2 98%   BMI 32.95 kg/m   GEN: Pleasant, interactive, well-appearing; obese; in no acute distress HEENT:  Normocephalic  and atraumatic. PERRLA. Sclera white. Nasal turbinates pink, moist and patent bilaterally. No rhinorrhea present. Oropharynx pink and moist, without exudate or edema. No lesions, ulcerations, or postnasal drip. Mallampati III NECK:  Supple w/ fair ROM. No JVD present. Normal carotid impulses w/o bruits. Thyroid symmetrical with no goiter or nodules palpated. No lymphadenopathy.   CV: RRR, no m/r/g, no peripheral edema. Pulses intact, +2 bilaterally. No cyanosis, pallor or clubbing. PULMONARY:  Unlabored, regular breathing. Clear bilaterally A&P w/o wheezes/rales/rhonchi. No accessory muscle use.  GI: BS present and normoactive. Soft, non-tender to palpation. No organomegaly or masses detected.  MSK: No erythema, warmth or tenderness. Cap refil <2 sec all extrem. No deformities or joint swelling noted.  Neuro: A/Ox3. No focal deficits noted.   Skin: Warm, no lesions or rashe Psych: Normal affect and behavior. Judgement and thought content appropriate.     Lab Results:  CBC    Component Value Date/Time   WBC 8.7 12/24/2017 0458   RBC 4.44 12/24/2017 0458   HGB 12.5 12/24/2017 0458   HGB 12.7 12/18/2017 1500   HCT 39.1 12/24/2017 0458   HCT 38.9 12/18/2017 1500   PLT PLATELET CLUMPS NOTED ON SMEAR, UNABLE TO  ESTIMATE 12/24/2017 0458   PLT 238 12/18/2017 1500   MCV 88.1 12/24/2017 0458   MCV 87 12/18/2017 1500   MCH 28.2 12/24/2017 0458   MCHC 32.0 12/24/2017 0458   RDW 13.1 12/24/2017 0458   RDW 13.0 12/18/2017 1500    BMET    Component Value Date/Time   NA 140 12/24/2017 0458   NA 142 12/18/2017 1500   K 3.9 12/24/2017 0458   CL 106 12/24/2017 0458   CO2 21 (L) 12/24/2017 0458   GLUCOSE 78 12/24/2017 0458   BUN 24 (H) 12/24/2017 0458   BUN 20 12/18/2017 1500   CREATININE 1.57 (H) 12/24/2017 0458   CREATININE 1.08 (H) 04/06/2015 1544   CALCIUM 8.9 12/24/2017 0458   GFRNONAA 34 (L) 12/24/2017 0458   GFRAA 39 (L) 12/24/2017 0458    BNP No results found for: "BNP"   Imaging:  No results found.  Administration History     None           No data to display          No results found for: "NITRICOXIDE"      Assessment & Plan:   OSA (obstructive sleep apnea) Severe OSA with significant oxygen desaturations. 24% central events.  Reviewed risks of untreated sleep apnea and potential treatment options.  Given severity, shared decision to move forward with CPAP therapy.  Will need to monitor for central emergence.  May need to consider CPAP titration study or pressure decrease pending control.  Patient aware that she may end up needing further sleep studies and is okay with this plan.  Urgent order placed for new start CPAP.  Educated on proper use/care of device.  Rate/benefits reviewed.  Healthy weight loss encouraged.  Safe driving practices reviewed.  Will reassess response at follow-up.  Aware to call if anything worsens or if she has any difficulties in interim.  Patient Instructions  Start CPAP auto 5-15 cmH2O, nasal mask, heated humidity, every night, minimum of 4-6 hours a night.  Change equipment as directed. Wash your tubing with warm soap and water daily, hang to dry. Wash humidifier portion weekly. Use bottled, distilled water and change daily Be aware of  reduced alertness and do not drive or operate heavy machinery if experiencing this or drowsiness.  Exercise encouraged, as tolerated. Healthy weight management discussed.  Avoid or decrease alcohol consumption and medications that make you more sleepy, if possible. Notify if persistent daytime sleepiness occurs even with consistent use of PAP therapy.  We discussed how untreated sleep apnea puts an individual at risk for cardiac arrhthymias, pulm HTN, DM, stroke and increases their risk for daytime accidents. We also briefly reviewed treatment options including weight loss, side sleeping position, oral appliance, CPAP therapy or referral to ENT for possible surgical options  Change supplies.... Every month Mask cushions and/or nasal pillows CPAP machine filters Every 3 months Mask frame (not including the headgear) CPAP tubing Every 6 months Mask headgear Chin strap (if applicable) Humidifier water tub  Follow up in 6 weeks with Katie Gresia Isidoro,NP to see how CPAP is going, or sooner, if needed    Obesity (BMI 30.0-34.9) BMI 32.9.  Healthy weight loss encouraged.   Advised if symptoms do not improve or worsen, to please contact office for sooner follow up or seek emergency care.   I spent 35 minutes of dedicated to the care of this patient on the date of this encounter to include pre-visit review of records, face-to-face time with the patient discussing conditions above, post visit ordering of testing, clinical documentation with the electronic health record, making appropriate referrals as documented, and communicating necessary findings to members of the patients care team.  Noemi Chapel, NP 03/06/2023  Pt aware and understands NP's role.

## 2023-03-06 NOTE — Telephone Encounter (Signed)
 Home sleep study from 02/17/23 is scanned into media. Left vmail making patient aware.

## 2023-03-06 NOTE — Assessment & Plan Note (Signed)
 Severe OSA with significant oxygen desaturations. 24% central events.  Reviewed risks of untreated sleep apnea and potential treatment options.  Given severity, shared decision to move forward with CPAP therapy.  Will need to monitor for central emergence.  May need to consider CPAP titration study or pressure decrease pending control.  Patient aware that she may end up needing further sleep studies and is okay with this plan.  Urgent order placed for new start CPAP.  Educated on proper use/care of device.  Rate/benefits reviewed.  Healthy weight loss encouraged.  Safe driving practices reviewed.  Will reassess response at follow-up.  Aware to call if anything worsens or if she has any difficulties in interim.  Patient Instructions  Start CPAP auto 5-15 cmH2O, nasal mask, heated humidity, every night, minimum of 4-6 hours a night.  Change equipment as directed. Wash your tubing with warm soap and water daily, hang to dry. Wash humidifier portion weekly. Use bottled, distilled water and change daily Be aware of reduced alertness and do not drive or operate heavy machinery if experiencing this or drowsiness.  Exercise encouraged, as tolerated. Healthy weight management discussed.  Avoid or decrease alcohol consumption and medications that make you more sleepy, if possible. Notify if persistent daytime sleepiness occurs even with consistent use of PAP therapy.  We discussed how untreated sleep apnea puts an individual at risk for cardiac arrhthymias, pulm HTN, DM, stroke and increases their risk for daytime accidents. We also briefly reviewed treatment options including weight loss, side sleeping position, oral appliance, CPAP therapy or referral to ENT for possible surgical options  Change supplies.... Every month Mask cushions and/or nasal pillows CPAP machine filters Every 3 months Mask frame (not including the headgear) CPAP tubing Every 6 months Mask headgear Chin strap (if  applicable) Humidifier water tub  Follow up in 6 weeks with Katie Camesha Farooq,NP to see how CPAP is going, or sooner, if needed

## 2023-03-06 NOTE — Assessment & Plan Note (Signed)
 BMI 32.9.  Healthy weight loss encouraged.

## 2023-03-11 ENCOUNTER — Ambulatory Visit: Payer: Medicare Other | Admitting: Neurology

## 2023-03-14 ENCOUNTER — Ambulatory Visit: Payer: Medicare Other | Admitting: Adult Health

## 2023-03-18 DIAGNOSIS — N261 Atrophy of kidney (terminal): Secondary | ICD-10-CM | POA: Diagnosis not present

## 2023-03-18 DIAGNOSIS — I129 Hypertensive chronic kidney disease with stage 1 through stage 4 chronic kidney disease, or unspecified chronic kidney disease: Secondary | ICD-10-CM | POA: Diagnosis not present

## 2023-03-18 DIAGNOSIS — N184 Chronic kidney disease, stage 4 (severe): Secondary | ICD-10-CM | POA: Diagnosis not present

## 2023-03-25 DIAGNOSIS — M797 Fibromyalgia: Secondary | ICD-10-CM | POA: Diagnosis not present

## 2023-03-25 DIAGNOSIS — M1991 Primary osteoarthritis, unspecified site: Secondary | ICD-10-CM | POA: Diagnosis not present

## 2023-03-25 DIAGNOSIS — M0579 Rheumatoid arthritis with rheumatoid factor of multiple sites without organ or systems involvement: Secondary | ICD-10-CM | POA: Diagnosis not present

## 2023-04-01 NOTE — Progress Notes (Unsigned)
 Cardiology Office Note    Date:  04/03/2023  ID:  Glynn, Freas 1950/12/19, MRN [id number] PCP:  Fatima Sanger, FNP  Cardiologist:  Olga Millers, MD  Electrophysiologist:  None   Chief Complaint: Follow up for CAD   History of Present Illness: .    Janet Johnston is a 73 y.o. 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   Cardiac catheterization in December 2019 showed 95% proximal to mid LAD which was treated with drug-eluting stent.  Ejection fraction at that time was 55 to 65%.  Abdominal ultrasound in 04/2017 showed ectatic abdominal aorta, recommended for follow-up in 5 years.  Echocardiogram in 08/2019 showed normal LV function, grade 1 DD.  Nuclear study in 08/2019 showed EF 69% with normal perfusion.  Abdominal ultrasound on 07/18/2022 showed no evidence of abdominal aortic aneurysm, largest aortic measurement was 2.6 cm, there is a normal tapering of the abdominal aorta, largest diameter noted in the distal segment, largest aortic diameter remained essentially unchanged compared to prior exam.  Patient was last seen in clinic on 03/12/2022 by Dr. Jens Som.  She remained stable from a cardiac standpoint.  She had recently been diagnosed with a DVT by her podiatrist and was started on Eliquis.  Today she presents for follow up. She reports that she has been doing ok, notes some right sided chest discomfort in the last week.  She reports that she recently had flooring down in her house and has been moving furniture, questions of musculoskeletal related.  She denies any chest discomfort with exertion, notes some slight shortness of breath occasionally with prolonged exertion, no significant change.  Denies any lower extremity edema, orthopnea or PND.  She reports that her blood pressures have been elevated at home with systolics in the 160s, reports that it was  in the 150s when she saw her PCP last week.  She reports that she recently had a sleep study that indicated severe sleep apnea, started on CPAP last week.  Reviewed lipid profile from November, patient reports that she had not been taking her Repatha consistently at that time.  She reports that she has been taking it as prescribed since early January.  Labwork independently reviewed: 11/16/2022: Creatinine 1.86, potassium 4.2, sodium 143, calcium 8.7, AST 17, ALT 11, hemoglobin A1c 6.1 ROS: .   Today she denies lower extremity edema, fatigue, palpitations, melena, hematuria, hemoptysis, diaphoresis, weakness, presyncope, syncope, orthopnea, and PND.  All other systems are reviewed and otherwise negative. Studies Reviewed: [name]    EKG:  EKG is ordered today, personally reviewed, demonstrating  EKG Interpretation Date/Time:  Wednesday April 03 2023 08:09:53 EDT Ventricular Rate:  60 PR Interval:  138 QRS Duration:  76 QT Interval:  410 QTC Calculation: 410 R Axis:   20  Text Interpretation: Normal sinus rhythm No significant change was found compared to prior tracings Confirmed by Reather Littler [phone] on 04/03/2023 8:15:18 AM   CV Studies: Cardiac studies reviewed are outlined and summarized above. Otherwise please see EMR for full report. Cardiac Studies & Procedures   ______________________________________________________________________________________________ CARDIAC CATHETERIZATION  CARDIAC CATHETERIZATION 12/23/2017  Narrative  Prox LAD to Mid LAD lesion is 95% stenosed.  Post intervention, there is a 0% residual stenosis.  A drug-eluting stent was successfully placed using a STENT SYNERGY DES 3X24.  The left ventricular systolic function is normal.  LV end diastolic pressure is normal.  The left ventricular ejection fraction is 55-65% by visual estimate.  1. Single vessel obstructive CAD involving the mid LAD 2. Normal LV function 3. Normal LVEDP 4. Successful PCI of the  LAD with DES x 1.  Plan: DAPT for one year. Will watch overnight with IV hydration due to CKD. Anticipate DC in am.  Findings Coronary Findings Diagnostic  Dominance: Right  Left Main Vessel was injected. Vessel is normal in caliber. The vessel exhibits minimal luminal irregularities.  Left Anterior Descending Prox LAD to Mid LAD lesion is 95% stenosed.  Left Circumflex Vessel was injected. Vessel is normal in caliber. The vessel exhibits minimal luminal irregularities.  Right Coronary Artery Vessel was injected. Vessel is normal in caliber. The vessel exhibits minimal luminal irregularities.  Intervention  Prox LAD to Mid LAD lesion Stent CATH [place]ISTA GUIDE 6FR XBLAD3.5 guide catheter was inserted. Lesion crossed with guidewire using a WIRE ASAHI PROWATER 180CM. Pre-stent angioplasty was performed using a BALLOON SAPPHIRE 2.5X15. A drug-eluting stent was successfully placed using a STENT SYNERGY DES 3X24. Stent strut is well apposed. Post-stent angioplasty was performed using a BALLOON SAPPHIRE [place] 3.5X18. Maximum pressure:  16 atm. Post-Intervention Lesion Assessment The intervention was successful. Pre-interventional TIMI flow is 3. Post-intervention TIMI flow is 3. No complications occurred at this lesion. There is a 0% residual stenosis post intervention.   CARDIAC CATHETERIZATION  CARDIAC CATHETERIZATION 04/08/2015  Narrative Images from the original result were not included. 1. Lat 1st Mrg lesion, 45% stenosed. 2. Mid LAD lesion, 45-55% stenosed. FFR 0.87. Non-Physiologically significant 3. The left ventricular systolic function is normal.   Angiographic moderate disease in the LAD and inferior branch of the OM. FFR evaluation of the LAD lesion was performed to confirm false positive stress test. The FFR was not physiologically significant.  Plan:  Standard post radial cath care with TR band removal per protocol  Anticipate discharge from PACU holding area/short  stay after bedrest.  Follow-up with Dr. Jens Som to discuss other potential etiologies for chest pain.   Marykay Lex, M.D., M.S. Interventional Cardiologist  Pager # [phone] Phone # [phone] [address] [place], [place] [address]  Findings Coronary Findings Diagnostic  Dominance: Right  Left Main . Vessel is large.  Left Anterior Descending Discrete eccentric.  Pressure wire/FFR was performed on the lesion.  FFR: 0.87.  Starting FFR 0.98  First Diagonal Branch The vessel is moderate in size.  First Septal Branch The vessel is small in size.  Second Diagonal Branch The vessel is moderate in size.  Second Septal Branch The vessel is small in size.  Left Circumflex  First Obtuse Marginal Branch The vessel is moderate in size.  Lateral First Obtuse Marginal Branch Discrete located at the major branch.  Right Coronary Artery . Vessel is large.  Acute Marginal Branch The vessel is moderate in size.  Right Posterior Descending Artery The vessel is moderate in size.  Right Posterior Atrioventricular Artery The vessel is moderate in size.  First Right Posterolateral Branch The vessel is small in size.  Second Right Posterolateral Branch The vessel is small in size.  Third Right Posterolateral Branch The vessel is small in size.  Intervention  No interventions have been documented.   STRESS TESTS  MYOCARDIAL PERFUSION IMAGING 08/28/2019  Narrative  Nuclear stress EF: 69%.  There was no ST segment deviation noted during stress.  No T wave inversion was noted during stress.  This is a low risk study.  Normal perfusion. LVEF 69%  with normal wall motion. This is a low risk study.   ECHOCARDIOGRAM  ECHOCARDIOGRAM COMPLETE 08/28/2019  Narrative ECHOCARDIOGRAM REPORT    Patient Name:   LAURYNN MCCORVEY Date of Exam: 08/28/2019 Medical Rec #:  [id number]         Height:       64.0 in Accession #:    [id number]         Weight:       207.0 lb Date of Birth:  1950-10-03         BSA:          1.985 m Patient Age:    69 years          BP:           110/82 mmHg Patient Gender: F                 HR:           62 bpm. Exam Location:  [address]  Procedure: 3D Echo, 2D Echo, Cardiac Doppler, Color Doppler and Strain Analysis  Indications:    R06.02 Shortness of breath  History:        Patient has no prior history of Echocardiogram examinations. CAD; Risk Factors:Current Smoker, Hypertension and Dyslipidemia. AAA. CKD stage 3.  Sonographer:    Garald Braver, RDCS Referring Phys: [id number] BRIAN S CRENSHAW  IMPRESSIONS   1. Left ventricular ejection fraction, by estimation, is 55 to 60%. The left ventricle has normal function. The left ventricle has no regional wall motion abnormalities. Left ventricular diastolic parameters are consistent with Grade I diastolic dysfunction (impaired relaxation). 2. Right ventricular systolic function is normal. The right ventricular size is normal. There is normal pulmonary artery systolic pressure. 3. The mitral valve is normal in structure. Trivial mitral valve regurgitation. No evidence of mitral stenosis. 4. The aortic valve is tricuspid. Aortic valve regurgitation is not visualized. Mild aortic valve sclerosis is present, with no evidence of aortic valve stenosis. 5. The inferior vena cava is normal in size with greater than 50% respiratory variability, suggesting right atrial pressure of 3 mmHg.  FINDINGS Left Ventricle: Left ventricular ejection fraction, by estimation, is 55 to 60%. The left ventricle has normal function. The left ventricle has no regional wall motion abnormalities. The left ventricular internal cavity size was normal in size. There is no left ventricular hypertrophy. Left ventricular diastolic parameters are consistent with Grade I diastolic dysfunction (impaired relaxation).  Right Ventricle: The right ventricular size is normal. No increase in right  ventricular wall thickness. Right ventricular systolic function is normal. There is normal pulmonary artery systolic pressure. The tricuspid regurgitant velocity is 2.24 m/s, and with an assumed right atrial pressure of 3 mmHg, the estimated right ventricular systolic pressure is 23.1 mmHg.  Left Atrium: Left atrial size was normal in size.  Right Atrium: Right atrial size was normal in size.  Pericardium: There is no evidence of pericardial effusion.  Mitral Valve: The mitral valve is normal in structure. Normal mobility of the mitral valve leaflets. Trivial mitral valve regurgitation. No evidence of mitral valve stenosis.  Tricuspid Valve: The tricuspid valve is normal in structure. Tricuspid valve regurgitation is mild . No evidence of tricuspid stenosis.  Aortic Valve: The aortic valve is tricuspid. Aortic valve regurgitation is not visualized. Mild aortic valve sclerosis is present, with no evidence of aortic valve stenosis.  Pulmonic Valve: The pulmonic valve was normal in structure. Pulmonic valve regurgitation is not visualized. No evidence of  pulmonic stenosis.  Aorta: The aortic root is normal in size and structure.  Venous: The inferior vena cava is normal in size with greater than 50% respiratory variability, suggesting right atrial pressure of 3 mmHg.  IAS/Shunts: No atrial level shunt detected by color flow Doppler.   LEFT VENTRICLE PLAX 2D LVIDd:         4.40 cm  Diastology LVIDs:         2.90 cm  LV e' lateral:   6.75 cm/s LV PW:         1.00 cm  LV E/e' lateral: 10.0 LV IVS:        0.80 cm  LV e' medial:    4.95 cm/s LVOT diam:     2.00 cm  LV E/e' medial:  13.7 LV SV:         77 LV SV Index:   39       2D Longitudinal Strain LVOT Area:     3.14 cm 2D Strain GLS (A2C):   -19.7 % 2D Strain GLS (A3C):   -22.5 % 2D Strain GLS (A4C):   -24.0 % 2D Strain GLS Avg:     -22.1 %  3D Volume EF: 3D EF:        59 % LV EDV:       99 ml LV ESV:       41 ml LV SV:         59 ml  RIGHT VENTRICLE RV Basal diam:  3.90 cm RV S prime:     9.45 cm/s TAPSE (M-mode): 1.7 cm RVSP:           23.1 mmHg  LEFT ATRIUM             Index       RIGHT ATRIUM           Index LA diam:        3.60 cm 1.81 cm/m  RA Pressure: 3.00 mmHg LA Vol (A2C):   49.7 ml 25.04 ml/m RA Area:     10.30 cm LA Vol (A4C):   25.2 ml 12.69 ml/m RA Volume:   19.00 ml  9.57 ml/m LA Biplane Vol: 36.1 ml 18.18 ml/m AORTIC VALVE LVOT Vmax:   104.00 cm/s LVOT Vmean:  73.100 cm/s LVOT VTI:    0.246 m  AORTA Ao Root diam: 3.20 cm Ao Asc diam:  3.60 cm  MITRAL VALVE               TRICUSPID VALVE TR Peak grad:   20.1 mmHg TR Vmax:        224.00 cm/s MV E velocity: 67.70 cm/s  Estimated RAP:  3.00 mmHg MV A velocity: 73.30 cm/s  RVSP:           23.1 mmHg MV E/A ratio:  0.92 SHUNTS Systemic VTI:  0.25 m Systemic Diam: 2.00 cm  Olga Millers MD Electronically signed by Olga Millers MD Signature Date/Time: 08/28/2019/12:45:37 PM    Final          ______________________________________________________________________________________________       Current Reported Medications:.    Current Meds  Medication Sig   amLODipine (NORVASC) 5 MG tablet Take 1 tablet (5 mg total) by mouth daily.   bisoprolol (ZEBETA) 10 MG tablet Take 10 mg by mouth daily.   ergocalciferol (VITAMIN D2) 50000 UNITS capsule Take 50,000 Units by mouth once a week. Every other week   FLUoxetine (PROZAC) 20 MG capsule Take 40 mg by  mouth every morning.   losartan (COZAAR) 50 MG tablet Take 100 mg by mouth daily.   Magnesium 250 MG TABS 3x weekly   Multiple Vitamins-Minerals (CENTRUM ADULTS PO) Take 1 tablet by mouth daily.   predniSONE (DELTASONE) 5 MG tablet Take 5 mg by mouth daily with breakfast.   REPATHA SURECLICK 140 MG/ML SOAJ Inject 1 Syringe into the skin every 14 (fourteen) days.   [DISCONTINUED] nitroGLYCERIN (NITROSTAT) 0.4 MG SL tablet DISSOLVE 1 TABLET UNDER THE TONGUE EVERY 5 MINUTES FOR  UP TO 3 DOSES AS NEEDED FOR CHEST PAIN. IF NO RELIEF AFTER 3 DOSES, CALL 911 OR GO TO ER.   Physical Exam:    VS:  BP (!) 154/82 (BP Location: Left Arm)   Pulse 60   Ht 5\' 3"  (1.6 m)   Wt 184 lb 3.2 oz (83.6 kg)   SpO2 99%   BMI 32.63 kg/m    Wt Readings from Last 3 Encounters:  04/03/23 184 lb 3.2 oz (83.6 kg)  03/06/23 186 lb (84.4 kg)  01/23/23 187 lb 3.2 oz (84.9 kg)    GEN: Well nourished, well developed in no acute distress NECK: No JVD; No carotid bruits CARDIAC: RRR, no murmurs, rubs, gallops RESPIRATORY:  Clear to auscultation without rales, wheezing or rhonchi  ABDOMEN: Soft, non-tender, non-distended EXTREMITIES:  No edema; No acute deformity     Asessement and Plan:.    CAD: s/p DES to LAD in 12/2017. Echocardiogram in 08/2019 showed normal LV function, grade 1 DD.  Nuclear study in 08/2019 showed EF 69% with normal perfusion. Stable with no anginal symptoms. No indication for ischemic evaluation.  Patient notes a right sided chest discomfort in the last week after moving furniture in her house, questions of musculoskeletal in nature, she will continue to monitor symptoms and if persistent or occurs with exertion she will notify the office.  Heart healthy diet and regular cardiovascular exercise encouraged.  Reviewed ED precautions. Continue aspirin 81 mg daily, bisoprolol 10 mg daily, losartan 100 mg daily and Repatha.  Start amlodipine 5 mg daily.  Refill of sublingual nitroglycerin provided, patient reports she has not needed.  Hyperlipidemia: Last lipid profile on 11/16/2022 indicated total cholesterol 202, HDL 72, triglycerides 134 and LDL 107.  Patient reports that she had not been taking her Repatha consistently at that time.  She reports that she has been taking consistently since January.  Check fasting lipid profile and LFTs.  Hypertension: Blood pressure today initially 158/72, on recheck was 154/82.  Patient reports that last week she was started on CPAP for OSA.   Will start patient on amlodipine 5 mg daily.  Encourage patient to monitor blood pressure at home and notify the office if consistently elevated above 130/80.  Blood pressure log provided.  Otherwise continue current antihypertensive regimen.  Ectatic abdominal aorta: Abdominal ultrasound on 07/18/2022 showed no evidence of abdominal aortic aneurysm, largest aortic measurement was 2.6 cm, there is a normal tapering of the abdominal aorta, largest diameter noted in the distal segment, largest aortic diameter remained essentially unchanged compared to prior exam.  CKD stage III: Last creatinine 1.86 on 11/16/2022.  Followed by nephrology.     Disposition: F/u with Joni Reining, NP or Reather Littler, NP in 3 months or sooner if needed.   Signed, Rip Harbour, NP

## 2023-04-03 ENCOUNTER — Encounter: Payer: Self-pay | Admitting: Cardiology

## 2023-04-03 ENCOUNTER — Ambulatory Visit: Attending: Cardiology | Admitting: Cardiology

## 2023-04-03 VITALS — BP 154/82 | HR 60 | Ht 63.0 in | Wt 184.2 lb

## 2023-04-03 DIAGNOSIS — E7800 Pure hypercholesterolemia, unspecified: Secondary | ICD-10-CM | POA: Diagnosis not present

## 2023-04-03 DIAGNOSIS — I2510 Atherosclerotic heart disease of native coronary artery without angina pectoris: Secondary | ICD-10-CM | POA: Diagnosis not present

## 2023-04-03 DIAGNOSIS — I10 Essential (primary) hypertension: Secondary | ICD-10-CM | POA: Diagnosis not present

## 2023-04-03 DIAGNOSIS — I714 Abdominal aortic aneurysm, without rupture, unspecified: Secondary | ICD-10-CM | POA: Diagnosis not present

## 2023-04-03 MED ORDER — NITROGLYCERIN 0.4 MG SL SUBL: SUBLINGUAL_TABLET | SUBLINGUAL | 2 refills | Status: AC

## 2023-04-03 MED ORDER — AMLODIPINE BESYLATE 5 MG PO TABS: 5.0000 mg | ORAL_TABLET | Freq: Every day | ORAL | 3 refills | Status: DC

## 2023-04-03 NOTE — Patient Instructions (Signed)
 Medication Instructions:  Start Amlodipine 5 mg once a day  *If you need a refill on your cardiac medications before your next appointment, please call your pharmacy*  Lab Work: With in the next 2 weeks we are going to need a fasting lipid panel and Lft's. If you have labs (blood work) drawn today and your tests are completely normal, you will receive your results only by: MyChart Message (if you have MyChart) OR A paper copy in the mail If you have any lab test that is abnormal or we need to change your treatment, we will call you to review the results.  Testing/Procedures: Please take your blood pressure daily for 2 weeks and send in a MyChart message. Please include heart rates. (One message at the end of the 2 weeks).   HOW TO TAKE YOUR BLOOD PRESSURE: Rest 5 minutes before taking your blood pressure. Don't smoke or drink caffeinated beverages for at least 30 minutes before. Take your blood pressure before (not after) you eat. Sit comfortably with your back supported and both feet on the floor (don't cross your legs). Elevate your arm to heart level on a table or a desk. Use the proper sized cuff. It should fit smoothly and snugly around your bare upper arm. There should be enough room to slip a fingertip under the cuff. The bottom edge of the cuff should be 1 inch above the crease of the elbow. Ideally, take 3 measurements at one sitting and record the average.   Follow-Up: At [facility], you and your health needs are our priority.  As part of our continuing mission to provide you with exceptional heart care, we have created designated Provider Care Teams.  These Care Teams include your primary Cardiologist (physician) and Advanced Practice Providers (APPs -  Physician Assistants and Nurse Practitioners) who all work together to provide you with the care you need, when you need it.  We recommend signing up for the patient portal called "MyChart".  Sign up information is  provided on this After Visit Summary.  MyChart is used to connect with patients for Virtual Visits (Telemedicine).  Patients are able to view lab/test results, encounter notes, upcoming appointments, etc.  Non-urgent messages can be sent to your provider as well.   To learn more about what you can do with MyChart, go to [email or web address].    Your next appointment:   3 month(s)  Provider:   Joni Reining, DNP, ANP or Reather Littler, NP  Other Instructions

## 2023-04-11 DIAGNOSIS — I2510 Atherosclerotic heart disease of native coronary artery without angina pectoris: Secondary | ICD-10-CM | POA: Diagnosis not present

## 2023-04-11 DIAGNOSIS — I10 Essential (primary) hypertension: Secondary | ICD-10-CM | POA: Diagnosis not present

## 2023-04-11 DIAGNOSIS — I714 Abdominal aortic aneurysm, without rupture, unspecified: Secondary | ICD-10-CM | POA: Diagnosis not present

## 2023-04-11 DIAGNOSIS — E7800 Pure hypercholesterolemia, unspecified: Secondary | ICD-10-CM | POA: Diagnosis not present

## 2023-04-11 LAB — LIPID PANEL
Chol/HDL Ratio: 1.9 ratio (ref 0.0–4.4)
Cholesterol, Total: 152 mg/dL (ref 100–199)
HDL: 80 mg/dL (ref >39)
LDL Chol Calc (NIH): 54 mg/dL (ref 0–99)
Triglycerides: 104 mg/dL (ref 0–149)
VLDL Cholesterol Cal: 18 mg/dL (ref 5–40)

## 2023-04-11 LAB — HEPATIC FUNCTION PANEL
ALT: 14 IU/L (ref 0–32)
AST: 22 IU/L (ref 0–40)
Albumin: 4 g/dL (ref 3.8–4.8)
Alkaline Phosphatase: 79 IU/L (ref 44–121)
Bilirubin Total: 0.5 mg/dL (ref 0.0–1.2)
Bilirubin, Direct: 0.18 mg/dL (ref 0.00–0.40)
Total Protein: 6.1 g/dL (ref 6.0–8.5)

## 2023-04-15 ENCOUNTER — Ambulatory Visit: Payer: Medicare Other | Admitting: Adult Health

## 2023-04-15 DIAGNOSIS — M170 Bilateral primary osteoarthritis of knee: Secondary | ICD-10-CM | POA: Diagnosis not present

## 2023-04-16 DIAGNOSIS — H35371 Puckering of macula, right eye: Secondary | ICD-10-CM | POA: Diagnosis not present

## 2023-05-06 ENCOUNTER — Encounter: Payer: Self-pay | Admitting: Nurse Practitioner

## 2023-05-06 ENCOUNTER — Ambulatory Visit: Payer: Medicare Other | Admitting: Nurse Practitioner

## 2023-05-06 VITALS — BP 144/80 | HR 58 | Ht 63.0 in | Wt 190.8 lb

## 2023-05-06 DIAGNOSIS — E66811 Obesity, class 1: Secondary | ICD-10-CM | POA: Diagnosis not present

## 2023-05-06 DIAGNOSIS — G4733 Obstructive sleep apnea (adult) (pediatric): Secondary | ICD-10-CM | POA: Diagnosis not present

## 2023-05-06 NOTE — Progress Notes (Signed)
 @Patient  ID: Janet Johnston, female    DOB: 04/14/1950, 73 y.o.   MRN: [id number]  Chief Complaint  Patient presents with   Follow-up    OSA    Referring provider: Jhon Moselle, FNP  HPI: 73 year old female, former smoker followed for OSA. Past medical history significant for AAA, CAD, HTN, statin myopathy, CKD, HLD.  TEST/EVENTS:  02/17/2023 HST: AHI 66.8/h, CAI 16/h (24%), SpO2 low 76%  01/23/2023: OV with Shahrzad Koble NP for sleep consult, referred by Dr. Janan Mead.  Has difficulties with sleep at night and wakes frequently to use the bathroom Does have some sleep talking but no sleep walking  Never been told she snores  Some occasional morning headaches  No drowsy driving  Has a new puppy, which has caused more disruptions in her sleep but she had difficulties long before this. Had a home sleep study in 2018. She was told she had sleep apnea but she didn't feel it was accurate. She denies drowsy driving, sleep paralysis.  She goes to bed between 10pm-midnight. Most nights falls asleep within 30 minutes but sometimes it can take longer. Wakes frequently. Gets up around 8-9 am. She is retired. Weight is down 10 lb in the last two years. Not on CPAP. No oxygen use.  She has a history of HTN. Was told she had, had two strokes before by neurology but she was unaware of this and has no residual effects. She does have some issues with her balance, which is why she went to see the neurologist. They have not found anything remarkable aside from an essential tremor. No falls. She has had a cardiac stent before.  She is a former smoker. Quit in 2019. Does not drink alcohol. Has 1 cup of coffee in AM. No sleep aids. Lives alone; widowed. Retired.  [place] 9  03/06/2023: OV with Keilynn Marano NP for follow-up after undergoing home sleep study which revealed severe sleep apnea.  She had a total AHI of 66.8/h with 24% central events.  She feels unchanged compared to her last visit.  Has a lot of disruptions  in her sleep at night.  Feels tired during the day.  Energy levels are not the best.  Does have frequent nighttime urination.  Denies any issues with drowsy driving.  No history of sleepwalking. Wants to discuss treatment options.   05/06/2023: Today - follow up Patient presents today for follow up after starting CPAP. She is feeling remarkably better now that she's on CPAP. She's sleeping restfully and she's actually getting up earlier. She's usually waking up around 7 am on her own now. She also isn't having to nap in the afternoons. Sometimes she will fall asleep without it and then if she wakes up, she'll put it on. No issues with mask fit or pressures. Wearing a hybrid FFM.  04/06/2023-05/05/2023: CPAP 5-15 cmH2O 27/30 days; 77% >4 hr; average use 5 hr 48 min Pressure 95th 13.4 Leaks 95th 36.4 AHI 4.7  Allergies  Allergen Reactions   Atorvastatin  Other (See Comments)   Iodinated Contrast Media    Rosuvastatin  Other (See Comments)   Zetia  [Ezetimibe ] Diarrhea   Lyrica [Pregabalin] Itching and Rash   Penicillins Itching and Rash    Has patient had a PCN reaction causing immediate rash, facial/tongue/throat swelling, SOB or lightheadedness with hypotension: No Has patient had a PCN reaction causing severe rash involving mucus membranes or skin necrosis: No Has patient had a PCN reaction that required hospitalization: No Has patient had  a PCN reaction occurring within the last 10 years: No If all of the above answers are "NO", then may proceed with Cephalosporin use.    Sulfa Antibiotics Itching and Rash    Immunization History  Administered Date(s) Administered   PFIZER(Purple Top)SARS-COV-2 Vaccination 03/01/2019, 03/25/2019   Tdap 09/10/2013    Past Medical History:  Diagnosis Date   Anxiety    Asthmatic bronchitis    Chronic bronchitis (HCC)    "get it q yr; we head w/wood stove" (12/23/2017)   Chronic kidney disease    "Creatinine level elevated last couple years"  (12/23/2017)   Chronic lower back pain    COPD (chronic obstructive pulmonary disease) (HCC)    Coronary artery disease    a. LHC 12/23/2017: pLAD to mLAD 95% s/p DES   Depression    DJD (degenerative joint disease)    Fibromyalgia    GERD (gastroesophageal reflux disease)    History of hiatal hernia    History of kidney stones    Hyperlipidemia    Hypertension    Pneumonia    Prediabetes    Rheumatoid arthritis (HCC)    Sleep apnea    "home test indicated that I had it; I've never used mask" (12/23/2017)   Tension headache    "controlled" (12/23/2017)    Tobacco History: Social History   Tobacco Use  Smoking Status Former   Current packs/day: 0.50   Types: Cigarettes  Smokeless Tobacco Never  Tobacco Comments   Stopped smoking 4 years ago. Quit December 2019.   Counseling given: Not Answered Tobacco comments: Stopped smoking 4 years ago. Quit December 2019.   Outpatient Medications Prior to Visit  Medication Sig Dispense Refill   amLODipine  (NORVASC ) 5 MG tablet Take 1 tablet (5 mg total) by mouth daily. 90 tablet 3   bisoprolol  (ZEBETA ) 10 MG tablet Take 10 mg by mouth daily.     ergocalciferol  (VITAMIN D2) 50000 UNITS capsule Take 50,000 Units by mouth once a week. Every other week     FLUoxetine  (PROZAC ) 20 MG capsule Take 40 mg by mouth every morning.     losartan  (COZAAR ) 50 MG tablet Take 100 mg by mouth daily.     Magnesium  250 MG TABS 3x weekly     Multiple Vitamins-Minerals (CENTRUM ADULTS PO) Take 1 tablet by mouth daily.     nitroGLYCERIN  (NITROSTAT ) 0.4 MG SL tablet DISSOLVE 1 TABLET UNDER THE TONGUE EVERY 5 MINUTES FOR UP TO 3 DOSES AS NEEDED FOR CHEST PAIN. IF NO RELIEF AFTER 3 DOSES, CALL 911 OR GO TO ER. 25 tablet 2   predniSONE  (DELTASONE ) 5 MG tablet Take 5 mg by mouth daily with breakfast.     REPATHA  SURECLICK 140 MG/ML SOAJ Inject 1 Syringe into the skin every 14 (fourteen) days.     No facility-administered medications prior to visit.      Review of Systems:   Constitutional: No night sweats, fevers, chills, or lassitude. +intentional weight loss, fatigue (improved) HEENT: No difficulty swallowing, tooth/dental problems, or sore throat. No sneezing, itching, ear ache, nasal congestion, or post nasal drip. +morning headaches  CV:  No chest pain, orthopnea, PND, swelling in lower extremities, anasarca, dizziness, palpitations, syncope Resp: +baseline shortness of breath with exertion. No excess mucus or change in color of mucus. No productive or non-productive. No hemoptysis. No wheezing.  No chest wall deformity GI:  No heartburn, indigestion, abdominal pain, nausea, vomiting, diarrhea, change in bowel habits, loss of appetite, bloody stools.  GU: No  dysuria, change in color of urine, urgency or frequency.  +nocturia (resolves with CPAP) Skin: No rash, lesions, ulcerations MSK:  No joint pain or swelling.  +joint stiffness (RA) Neuro: +gait changes, tremor. No memory impairment  Psych: No depression or anxiety. Mood stable. +sleep disturbance (improved)    Physical Exam:  BP (!) 144/80 (BP Location: Left Arm, Patient Position: Sitting, Cuff Size: Normal)   Pulse (!) 58   Ht 5\' 3"  (1.6 m)   Wt 190 lb 12.8 oz (86.5 kg)   SpO2 97%   BMI 33.80 kg/m   GEN: Pleasant, interactive, well-appearing; obese; in no acute distress HEENT:  Normocephalic and atraumatic. PERRLA. Sclera white. Nasal turbinates pink, moist and patent bilaterally. No rhinorrhea present. Oropharynx pink and moist, without exudate or edema. No lesions, ulcerations, or postnasal drip. Mallampati III NECK:  Supple w/ fair ROM. No JVD present. Normal carotid impulses w/o bruits. Thyroid  symmetrical with no goiter or nodules palpated. No lymphadenopathy.   CV: RRR, no m/r/g, no peripheral edema. Pulses intact, +2 bilaterally. No cyanosis, pallor or clubbing. PULMONARY:  Unlabored, regular breathing. Clear bilaterally A&P w/o wheezes/rales/rhonchi. No  accessory muscle use.  GI: BS present and normoactive. Soft, non-tender to palpation. No organomegaly or masses detected.  MSK: No erythema, warmth or tenderness. Cap refil <2 sec all extrem. No deformities or joint swelling noted.  Neuro: A/Ox3. No focal deficits noted.   Skin: Warm, no lesions or rashe Psych: Normal affect and behavior. Judgement and thought content appropriate.     Lab Results:  CBC    Component Value Date/Time   WBC 8.7 12/24/2017 0458   RBC 4.44 12/24/2017 0458   HGB 12.5 12/24/2017 0458   HGB 12.7 12/18/2017 1500   HCT 39.1 12/24/2017 0458   HCT 38.9 12/18/2017 1500   PLT PLATELET CLUMPS NOTED ON SMEAR, UNABLE TO ESTIMATE 12/24/2017 0458   PLT 238 12/18/2017 1500   MCV 88.1 12/24/2017 0458   MCV 87 12/18/2017 1500   MCH 28.2 12/24/2017 0458   MCHC 32.0 12/24/2017 0458   RDW 13.1 12/24/2017 0458   RDW 13.0 12/18/2017 1500    BMET    Component Value Date/Time   NA 140 12/24/2017 0458   NA 142 12/18/2017 1500   K 3.9 12/24/2017 0458   CL 106 12/24/2017 0458   CO2 21 (L) 12/24/2017 0458   GLUCOSE 78 12/24/2017 0458   BUN 24 (H) 12/24/2017 0458   BUN 20 12/18/2017 1500   CREATININE 1.57 (H) 12/24/2017 0458   CREATININE 1.08 (H) 04/06/2015 1544   CALCIUM  8.9 12/24/2017 0458   GFRNONAA 34 (L) 12/24/2017 0458   GFRAA 39 (L) 12/24/2017 0458    BNP No results found for: "BNP"   Imaging:  No results found.  Administration History     None           No data to display          No results found for: "NITRICOXIDE"      Assessment & Plan:   OSA (obstructive sleep apnea) Severe OSA on CPAP. Good compliance and control. Receives benefit from use. Encouraged to utilize nightly. Aware of risks of untreated OSA. Understands proper care/use of device. Continued healthy weight loss encouraged. Safe driving practices reviewed.  Patient Instructions  Continue CPAP auto 5-15 cmH2O, nasal mask, heated humidity, every night, minimum of 4-6  hours a night.  Change equipment as directed. Wash your tubing with warm soap and water daily, hang to dry. Wash humidifier  portion weekly. Use bottled, distilled water and change daily Be aware of reduced alertness and do not drive or operate heavy machinery if experiencing this or drowsiness.  Exercise encouraged, as tolerated. Healthy weight management discussed.  Avoid or decrease alcohol consumption and medications that make you more sleepy, if possible. Notify if persistent daytime sleepiness occurs even with consistent use of PAP therapy.   We discussed how untreated sleep apnea puts an individual at risk for cardiac arrhthymias, pulm HTN, DM, stroke and increases their risk for daytime accidents. We also briefly reviewed treatment options including weight loss, side sleeping position, oral appliance, CPAP therapy or referral to ENT for possible surgical options   Change supplies.... Every month Mask cushions and/or nasal pillows CPAP machine filters Every 3 months Mask frame (not including the headgear) CPAP tubing Every 6 months Mask headgear Chin strap (if applicable) Humidifier water tub   Follow up in 6 months with Katie Afton Mikelson,NP, or sooner, if needed    Obesity (BMI 30.0-34.9) BMI 33. Healthy weight loss encouraged.     Advised if symptoms do not improve or worsen, to please contact office for sooner follow up or seek emergency care.   I spent 25 minutes of dedicated to the care of this patient on the date of this encounter to include pre-visit review of records, face-to-face time with the patient discussing conditions above, post visit ordering of testing, clinical documentation with the electronic health record, making appropriate referrals as documented, and communicating necessary findings to members of the patients care team.  Roetta Clarke, NP 05/06/2023  Pt aware and understands NP's role.

## 2023-05-06 NOTE — Assessment & Plan Note (Signed)
 BMI 33. Healthy weight loss encouraged

## 2023-05-06 NOTE — Patient Instructions (Signed)
 Continue CPAP auto 5-15 cmH2O, nasal mask, heated humidity, every night, minimum of 4-6 hours a night.  Change equipment as directed. Wash your tubing with warm soap and water daily, hang to dry. Wash humidifier portion weekly. Use bottled, distilled water and change daily Be aware of reduced alertness and do not drive or operate heavy machinery if experiencing this or drowsiness.  Exercise encouraged, as tolerated. Healthy weight management discussed.  Avoid or decrease alcohol consumption and medications that make you more sleepy, if possible. Notify if persistent daytime sleepiness occurs even with consistent use of PAP therapy.   We discussed how untreated sleep apnea puts an individual at risk for cardiac arrhthymias, pulm HTN, DM, stroke and increases their risk for daytime accidents. We also briefly reviewed treatment options including weight loss, side sleeping position, oral appliance, CPAP therapy or referral to ENT for possible surgical options   Change supplies.... Every month Mask cushions and/or nasal pillows CPAP machine filters Every 3 months Mask frame (not including the headgear) CPAP tubing Every 6 months Mask headgear Chin strap (if applicable) Humidifier water tub   Follow up in 6 months with Janet Naser Schuld,NP, or sooner, if needed

## 2023-05-06 NOTE — Assessment & Plan Note (Signed)
 Severe OSA on CPAP. Good compliance and control. Receives benefit from use. Encouraged to utilize nightly. Aware of risks of untreated OSA. Understands proper care/use of device. Continued healthy weight loss encouraged. Safe driving practices reviewed.  Patient Instructions  Continue CPAP auto 5-15 cmH2O, nasal mask, heated humidity, every night, minimum of 4-6 hours a night.  Change equipment as directed. Wash your tubing with warm soap and water daily, hang to dry. Wash humidifier portion weekly. Use bottled, distilled water and change daily Be aware of reduced alertness and do not drive or operate heavy machinery if experiencing this or drowsiness.  Exercise encouraged, as tolerated. Healthy weight management discussed.  Avoid or decrease alcohol consumption and medications that make you more sleepy, if possible. Notify if persistent daytime sleepiness occurs even with consistent use of PAP therapy.   We discussed how untreated sleep apnea puts an individual at risk for cardiac arrhthymias, pulm HTN, DM, stroke and increases their risk for daytime accidents. We also briefly reviewed treatment options including weight loss, side sleeping position, oral appliance, CPAP therapy or referral to ENT for possible surgical options   Change supplies.... Every month Mask cushions and/or nasal pillows CPAP machine filters Every 3 months Mask frame (not including the headgear) CPAP tubing Every 6 months Mask headgear Chin strap (if applicable) Humidifier water tub   Follow up in 6 months with [name],NP, or sooner, if needed

## 2023-05-17 DIAGNOSIS — Z0000 Encounter for general adult medical examination without abnormal findings: Secondary | ICD-10-CM | POA: Diagnosis not present

## 2023-05-17 DIAGNOSIS — E559 Vitamin D deficiency, unspecified: Secondary | ICD-10-CM | POA: Diagnosis not present

## 2023-05-17 DIAGNOSIS — E7800 Pure hypercholesterolemia, unspecified: Secondary | ICD-10-CM | POA: Diagnosis not present

## 2023-05-17 DIAGNOSIS — I10 Essential (primary) hypertension: Secondary | ICD-10-CM | POA: Diagnosis not present

## 2023-05-18 LAB — LAB REPORT - SCANNED
A1c: 6.2
EGFR (Non-African Amer.): 37
TSH: 1.64 (ref 0.41–5.90)

## 2023-05-24 ENCOUNTER — Ambulatory Visit: Payer: Self-pay | Admitting: Cardiology

## 2023-05-24 DIAGNOSIS — Z0000 Encounter for general adult medical examination without abnormal findings: Secondary | ICD-10-CM | POA: Diagnosis not present

## 2023-05-24 DIAGNOSIS — K219 Gastro-esophageal reflux disease without esophagitis: Secondary | ICD-10-CM | POA: Diagnosis not present

## 2023-05-24 DIAGNOSIS — E7800 Pure hypercholesterolemia, unspecified: Secondary | ICD-10-CM | POA: Diagnosis not present

## 2023-05-24 DIAGNOSIS — I10 Essential (primary) hypertension: Secondary | ICD-10-CM | POA: Diagnosis not present

## 2023-05-24 DIAGNOSIS — N261 Atrophy of kidney (terminal): Secondary | ICD-10-CM | POA: Diagnosis not present

## 2023-05-24 DIAGNOSIS — I700 Atherosclerosis of aorta: Secondary | ICD-10-CM | POA: Diagnosis not present

## 2023-05-24 DIAGNOSIS — J439 Emphysema, unspecified: Secondary | ICD-10-CM | POA: Diagnosis not present

## 2023-05-24 DIAGNOSIS — N184 Chronic kidney disease, stage 4 (severe): Secondary | ICD-10-CM | POA: Diagnosis not present

## 2023-05-24 DIAGNOSIS — E559 Vitamin D deficiency, unspecified: Secondary | ICD-10-CM | POA: Diagnosis not present

## 2023-05-24 DIAGNOSIS — I2510 Atherosclerotic heart disease of native coronary artery without angina pectoris: Secondary | ICD-10-CM | POA: Diagnosis not present

## 2023-05-24 DIAGNOSIS — I77811 Abdominal aortic ectasia: Secondary | ICD-10-CM | POA: Diagnosis not present

## 2023-05-30 DIAGNOSIS — M170 Bilateral primary osteoarthritis of knee: Secondary | ICD-10-CM | POA: Diagnosis not present

## 2023-06-06 DIAGNOSIS — M170 Bilateral primary osteoarthritis of knee: Secondary | ICD-10-CM | POA: Diagnosis not present

## 2023-06-07 DIAGNOSIS — K219 Gastro-esophageal reflux disease without esophagitis: Secondary | ICD-10-CM | POA: Diagnosis not present

## 2023-06-07 DIAGNOSIS — I10 Essential (primary) hypertension: Secondary | ICD-10-CM | POA: Diagnosis not present

## 2023-06-13 DIAGNOSIS — M170 Bilateral primary osteoarthritis of knee: Secondary | ICD-10-CM | POA: Diagnosis not present

## 2023-06-13 NOTE — Progress Notes (Unsigned)
 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   Cardiac catheterization in December 2019 showed 95% proximal to mid LAD which was treated with drug-eluting stent.  Ejection fraction at that time was 55 to 65%.  Abdominal ultrasound in 04/2017 showed ectatic abdominal aorta, recommended for follow-up in 5 years.  Echocardiogram in 08/2019 showed normal LV function, grade 1 DD.  Nuclear study in 08/2019 showed EF 69% with normal perfusion.  Abdominal ultrasound on 07/18/2022 showed no evidence of abdominal aortic aneurysm, largest aortic measurement was 2.6 cm, there is a normal tapering of the abdominal aorta, largest diameter noted in the distal segment, largest aortic diameter remained essentially unchanged compared to prior exam.  Patient was seen in clinic on 03/12/2022 by Dr. Audery Blazing.  She remained stable from a cardiac standpoint.  She had recently been diagnosed with a DVT by her podiatrist and was started on Eliquis.  Patient was last seen in clinic on 04/03/2023 for follow-up.  She remained stable from cardiac standpoint.  She did note some right sided chest discomfort in the last week after she had been moving furniture and flooring, felt to be musculoskeletal related.  She noted stable dyspnea with ongoing exertion.  She did report that her blood pressure has been elevated at home with systolics in the 160s and 150s when she had seen her PCP the prior week.  Patient was started on amlodipine  5 mg daily for elevated blood pressures.  Today she presents for follow-up.  She reports that she  CAD: s/p DES to LAD in 12/2017.  Echo in 08/2019 showed normal LV function, G1 DD.  Nuclear study in 08/2019 showed EF  69% with normal perfusion. Continue  Hyperlipidemia: Last lipid profile  Hypertension: Blood pressure today  She reports her blood pressure at home  Ectatic abdominal aorta:  CKD stage III: Last creatinine    History of Present Illness: .    Janet Johnston is a 73 y.o. female with visit-pertinent history of   Labwork independently reviewed:   ROS: .   *** denies chest pain, shortness of breath, lower extremity edema, fatigue, palpitations, melena, hematuria, hemoptysis, diaphoresis, weakness, presyncope, syncope, orthopnea, and PND.  All other systems are reviewed and otherwise negative.  Studies Reviewed: Aaron Aas    EKG:  EKG is ordered today, personally reviewed, demonstrating ***     CV Studies: Cardiac studies reviewed are outlined and summarized above. Otherwise please see EMR for full report. Cardiac Studies & Procedures   ______________________________________________________________________________________________ CARDIAC CATHETERIZATION  CARDIAC CATHETERIZATION 12/23/2017  Conclusion  Prox LAD to Mid LAD lesion is 95% stenosed.  Post intervention, there is a 0% residual stenosis.  A drug-eluting stent was successfully placed using a STENT SYNERGY DES 3X24.  The left ventricular systolic function is normal.  LV end diastolic pressure is normal.  The left ventricular ejection fraction is 55-65% by visual estimate.  1. Single vessel obstructive CAD involving the mid LAD 2. Normal LV function 3. Normal LVEDP 4. Successful PCI of the LAD with DES x 1.  Plan: DAPT for one year. Will watch overnight with IV hydration due to CKD. Anticipate DC in am.  Findings Coronary Findings Diagnostic  Dominance: Right  Left Main Vessel was injected. Vessel is normal in caliber. The vessel exhibits minimal luminal irregularities.  Left Anterior Descending Prox LAD to Mid LAD lesion is 95% stenosed.  Left Circumflex Vessel was injected. Vessel is normal in caliber. The  vessel exhibits minimal luminal irregularities.  Right Coronary Artery Vessel was injected. Vessel is normal in caliber. The vessel exhibits minimal luminal irregularities.  Intervention  Prox LAD to Mid LAD lesion Stent CATH VISTA GUIDE 6FR XBLAD3.5 guide catheter was inserted. Lesion crossed with guidewire using a WIRE ASAHI PROWATER 180CM. Pre-stent angioplasty was performed using a BALLOON SAPPHIRE 2.5X15. A drug-eluting stent was successfully placed using a STENT SYNERGY DES 3X24. Stent strut is well apposed. Post-stent angioplasty was performed using a BALLOON SAPPHIRE [place] 3.5X18. Maximum pressure:  16 atm. Post-Intervention Lesion Assessment The intervention was successful. Pre-interventional TIMI flow is 3. Post-intervention TIMI flow is 3. No complications occurred at this lesion. There is a 0% residual stenosis post intervention.   CARDIAC CATHETERIZATION  CARDIAC CATHETERIZATION 04/08/2015  Conclusion Images from the original result were not included. 1. Lat 1st Mrg lesion, 45% stenosed. 2. Mid LAD lesion, 45-55% stenosed. FFR 0.87. Non-Physiologically significant 3. The left ventricular systolic function is normal.   Angiographic moderate disease in the LAD and inferior branch of the OM. FFR evaluation of the LAD lesion was performed to confirm false positive stress test. The FFR was not physiologically significant.  Plan:  Standard post radial cath care with TR band removal per protocol  Anticipate discharge from PACU holding area/short stay after bedrest.  Follow-up with Dr. Audery Blazing to discuss other potential etiologies for chest pain.   Arleen Lacer, M.D., M.S. Interventional Cardiologist  Pager # [phone] Phone # [phone] [address] [place], [place] [address]  Findings Coronary Findings Diagnostic  Dominance: Right  Left Main . Vessel is large.  Left Anterior Descending Discrete eccentric.  Pressure wire/FFR was performed  on the lesion.  FFR: 0.87.  Starting FFR 0.98  First Diagonal Branch The vessel is moderate in size.  First Septal Branch The vessel is small in size.  Second Diagonal Branch The vessel is moderate in size.  Second Septal Branch The vessel is small in size.  Left Circumflex  First Obtuse Marginal Branch The vessel is moderate in size.  Lateral First Obtuse Marginal Branch Discrete located at the major branch.  Right Coronary Artery . Vessel is large.  Acute Marginal Branch The vessel is moderate in size.  Right Posterior Descending Artery The vessel is moderate in size.  Right Posterior Atrioventricular Artery The vessel is moderate in size.  First Right Posterolateral Branch The vessel is small in size.  Second Right Posterolateral Branch The vessel is small in size.  Third Right Posterolateral Branch The vessel is small in size.  Intervention  No interventions have been documented.   STRESS TESTS  MYOCARDIAL PERFUSION IMAGING 08/28/2019  Narrative  Nuclear stress EF: 69%.  There was no ST segment deviation noted during stress.  No T wave inversion was noted during stress.  This is a low risk study.  Normal perfusion. LVEF 69% with normal wall motion. This is a low risk study.   ECHOCARDIOGRAM  ECHOCARDIOGRAM COMPLETE 08/28/2019  Narrative ECHOCARDIOGRAM REPORT    Patient Name:   Janet Johnston Date of Exam: 08/28/2019 Medical Rec #:  [id number]         Height:       64.0 in Accession #:    [id number]  Weight:       207.0 lb Date of Birth:  July 31, 1950         BSA:          1.985 m Patient Age:    69 years          BP:           110/82 mmHg Patient Gender: F                 HR:           62 bpm. Exam Location:  [address]  Procedure: 3D Echo, 2D Echo, Cardiac Doppler, Color Doppler and Strain Analysis  Indications:    R06.02 Shortness of breath  History:        Patient has no prior history of Echocardiogram  examinations. CAD; Risk Factors:Current Smoker, Hypertension and Dyslipidemia. AAA. CKD stage 3.  Sonographer:    Henriette Lofty, RDCS Referring Phys: [id number] BRIAN S CRENSHAW  IMPRESSIONS   1. Left ventricular ejection fraction, by estimation, is 55 to 60%. The left ventricle has normal function. The left ventricle has no regional wall motion abnormalities. Left ventricular diastolic parameters are consistent with Grade I diastolic dysfunction (impaired relaxation). 2. Right ventricular systolic function is normal. The right ventricular size is normal. There is normal pulmonary artery systolic pressure. 3. The mitral valve is normal in structure. Trivial mitral valve regurgitation. No evidence of mitral stenosis. 4. The aortic valve is tricuspid. Aortic valve regurgitation is not visualized. Mild aortic valve sclerosis is present, with no evidence of aortic valve stenosis. 5. The inferior vena cava is normal in size with greater than 50% respiratory variability, suggesting right atrial pressure of 3 mmHg.  FINDINGS Left Ventricle: Left ventricular ejection fraction, by estimation, is 55 to 60%. The left ventricle has normal function. The left ventricle has no regional wall motion abnormalities. The left ventricular internal cavity size was normal in size. There is no left ventricular hypertrophy. Left ventricular diastolic parameters are consistent with Grade I diastolic dysfunction (impaired relaxation).  Right Ventricle: The right ventricular size is normal. No increase in right ventricular wall thickness. Right ventricular systolic function is normal. There is normal pulmonary artery systolic pressure. The tricuspid regurgitant velocity is 2.24 m/s, and with an assumed right atrial pressure of 3 mmHg, the estimated right ventricular systolic pressure is 23.1 mmHg.  Left Atrium: Left atrial size was normal in size.  Right Atrium: Right atrial size was normal in size.  Pericardium: There is  no evidence of pericardial effusion.  Mitral Valve: The mitral valve is normal in structure. Normal mobility of the mitral valve leaflets. Trivial mitral valve regurgitation. No evidence of mitral valve stenosis.  Tricuspid Valve: The tricuspid valve is normal in structure. Tricuspid valve regurgitation is mild . No evidence of tricuspid stenosis.  Aortic Valve: The aortic valve is tricuspid. Aortic valve regurgitation is not visualized. Mild aortic valve sclerosis is present, with no evidence of aortic valve stenosis.  Pulmonic Valve: The pulmonic valve was normal in structure. Pulmonic valve regurgitation is not visualized. No evidence of pulmonic stenosis.  Aorta: The aortic root is normal in size and structure.  Venous: The inferior vena cava is normal in size with greater than 50% respiratory variability, suggesting right atrial pressure of 3 mmHg.  IAS/Shunts: No atrial level shunt detected by color flow Doppler.   LEFT VENTRICLE PLAX 2D LVIDd:         4.40 cm  Diastology LVIDs:  2.90 cm  LV e' lateral:   6.75 cm/s LV PW:         1.00 cm  LV E/e' lateral: 10.0 LV IVS:        0.80 cm  LV e' medial:    4.95 cm/s LVOT diam:     2.00 cm  LV E/e' medial:  13.7 LV SV:         77 LV SV Index:   39       2D Longitudinal Strain LVOT Area:     3.14 cm 2D Strain GLS (A2C):   -19.7 % 2D Strain GLS (A3C):   -22.5 % 2D Strain GLS (A4C):   -24.0 % 2D Strain GLS Avg:     -22.1 %  3D Volume EF: 3D EF:        59 % LV EDV:       99 ml LV ESV:       41 ml LV SV:        59 ml  RIGHT VENTRICLE RV Basal diam:  3.90 cm RV S prime:     9.45 cm/s TAPSE (M-mode): 1.7 cm RVSP:           23.1 mmHg  LEFT ATRIUM             Index       RIGHT ATRIUM           Index LA diam:        3.60 cm 1.81 cm/m  RA Pressure: 3.00 mmHg LA Vol (A2C):   49.7 ml 25.04 ml/m RA Area:     10.30 cm LA Vol (A4C):   25.2 ml 12.69 ml/m RA Volume:   19.00 ml  9.57 ml/m LA Biplane Vol: 36.1 ml 18.18  ml/m AORTIC VALVE LVOT Vmax:   104.00 cm/s LVOT Vmean:  73.100 cm/s LVOT VTI:    0.246 m  AORTA Ao Root diam: 3.20 cm Ao Asc diam:  3.60 cm  MITRAL VALVE               TRICUSPID VALVE TR Peak grad:   20.1 mmHg TR Vmax:        224.00 cm/s MV E velocity: 67.70 cm/s  Estimated RAP:  3.00 mmHg MV A velocity: 73.30 cm/s  RVSP:           23.1 mmHg MV E/A ratio:  0.92 SHUNTS Systemic VTI:  0.25 m Systemic Diam: 2.00 cm  Alexandria Angel MD Electronically signed by Alexandria Angel MD Signature Date/Time: 08/28/2019/12:45:37 PM    Final          ______________________________________________________________________________________________       Current Reported Medications:.    No outpatient medications have been marked as taking for the 06/20/23 encounter (Appointment) with Evi Mccomb D, NP.    Physical Exam:    VS:  There were no vitals taken for this visit.   Wt Readings from Last 3 Encounters:  05/06/23 190 lb 12.8 oz (86.5 kg)  04/03/23 184 lb 3.2 oz (83.6 kg)  03/06/23 186 lb (84.4 kg)    GEN: Well nourished, well developed in no acute distress NECK: No JVD; No carotid bruits CARDIAC: ***RRR, no murmurs, rubs, gallops RESPIRATORY:  Clear to auscultation without rales, wheezing or rhonchi  ABDOMEN: Soft, non-tender, non-distended EXTREMITIES:  No edema; No acute deformity     Asessement and Plan:.     ***     Disposition: F/u with ***  Signed, Eulanda Dorion D Jahmai Finelli, NP

## 2023-06-20 ENCOUNTER — Encounter: Payer: Self-pay | Admitting: Cardiology

## 2023-06-20 ENCOUNTER — Telehealth (HOSPITAL_COMMUNITY): Payer: Self-pay | Admitting: *Deleted

## 2023-06-20 ENCOUNTER — Ambulatory Visit: Attending: Cardiology | Admitting: Cardiology

## 2023-06-20 VITALS — BP 108/60 | HR 62 | Ht 63.0 in | Wt 190.2 lb

## 2023-06-20 DIAGNOSIS — I10 Essential (primary) hypertension: Secondary | ICD-10-CM

## 2023-06-20 DIAGNOSIS — I714 Abdominal aortic aneurysm, without rupture, unspecified: Secondary | ICD-10-CM | POA: Diagnosis not present

## 2023-06-20 DIAGNOSIS — E7800 Pure hypercholesterolemia, unspecified: Secondary | ICD-10-CM

## 2023-06-20 DIAGNOSIS — R079 Chest pain, unspecified: Secondary | ICD-10-CM

## 2023-06-20 DIAGNOSIS — I2510 Atherosclerotic heart disease of native coronary artery without angina pectoris: Secondary | ICD-10-CM | POA: Diagnosis not present

## 2023-06-20 MED ORDER — AMLODIPINE BESYLATE 5 MG PO TABS: 5.0000 mg | ORAL_TABLET | Freq: Every day | ORAL | Status: AC

## 2023-06-20 NOTE — Patient Instructions (Signed)
 Medication Instructions:  Your physician has recommended you make the following change in your medication:   Decrease Amlodipine  back to 5mg  - 1 tablet by mouth daily.  *If you need a refill on your cardiac medications before your next appointment, please call your pharmacy*  Lab Work: None ordered.  If you have labs (blood work) drawn today and your tests are completely normal, you will receive your results only by: MyChart Message (if you have MyChart) OR A paper copy in the mail If you have any lab test that is abnormal or we need to change your treatment, we will call you to review the results.  Testing/Procedures: Your physician has requested that you have a lexiscan  myoview . For further information please visit [email or web address] Please follow instruction sheet, as given.    Follow-Up: At [facility], you and your health needs are our priority.  As part of our continuing mission to provide you with exceptional heart care, our providers are all part of one team.  This team includes your primary Cardiologist (physician) and Advanced Practice Providers or APPs (Physician Assistants and Nurse Practitioners) who all work together to provide you with the care you need, when you need it.  Your next appointment:   8 weeks with Janet West,NP  We recommend signing up for the patient portal called MyChart.  Sign up information is provided on this After Visit Summary.  MyChart is used to connect with patients for Virtual Visits (Telemedicine).  Patients are able to view lab/test results, encounter notes, upcoming appointments, etc.  Non-urgent messages can be sent to your provider as well.   To learn more about what you can do with MyChart, go to [email or web address].

## 2023-06-20 NOTE — Telephone Encounter (Signed)
 Left detailed instructions for MPI study.

## 2023-06-24 ENCOUNTER — Other Ambulatory Visit: Payer: Self-pay | Admitting: Cardiology

## 2023-06-24 DIAGNOSIS — I2510 Atherosclerotic heart disease of native coronary artery without angina pectoris: Secondary | ICD-10-CM

## 2023-06-24 DIAGNOSIS — R079 Chest pain, unspecified: Secondary | ICD-10-CM

## 2023-06-27 ENCOUNTER — Ambulatory Visit (HOSPITAL_COMMUNITY)
Admission: RE | Admit: 2023-06-27 | Discharge: 2023-06-27 | Disposition: A | Discharge status: Home / Self Care | Source: Ambulatory Visit | Attending: Cardiology | Admitting: Cardiology

## 2023-06-27 DIAGNOSIS — I2510 Atherosclerotic heart disease of native coronary artery without angina pectoris: Secondary | ICD-10-CM | POA: Diagnosis not present

## 2023-06-27 DIAGNOSIS — R079 Chest pain, unspecified: Secondary | ICD-10-CM | POA: Diagnosis not present

## 2023-06-27 LAB — MYOCARDIAL PERFUSION IMAGING
LV dias vol: 92 mL (ref 46–106)
LV sys vol: 26 mL (ref 3.8–5.2)
Nuc Stress EF: 72 %
Peak HR: 74 {beats}/min
Rest HR: 57 {beats}/min
Rest Nuclear Isotope Dose: 10.6 mCi
SDS: 6
SRS: 3
SSS: 9
ST Depression (mm): 0 mm
Stress Nuclear Isotope Dose: 32.4 mCi
TID: 1.14

## 2023-06-27 MED ORDER — TECHNETIUM TC 99M TETROFOSMIN IV KIT
32.4000 | PACK | Freq: Once | INTRAVENOUS | Status: AC | PRN
  Administered 2023-06-27: 32.4 via INTRAVENOUS

## 2023-06-27 MED ORDER — REGADENOSON 0.4 MG/5ML IV SOLN
0.4000 mg | Freq: Once | INTRAVENOUS | Status: AC
  Administered 2023-06-27: 0.4 mg via INTRAVENOUS

## 2023-06-27 MED ORDER — TECHNETIUM TC 99M TETROFOSMIN IV KIT
10.2000 | PACK | Freq: Once | INTRAVENOUS | Status: AC | PRN
  Administered 2023-06-27: 10.2 via INTRAVENOUS

## 2023-06-27 MED ORDER — REGADENOSON 0.4 MG/5ML IV SOLN
INTRAVENOUS | Status: AC
Start: 2023-06-27 — End: 2023-06-27
  Filled 2023-06-27: qty 5

## 2023-06-28 ENCOUNTER — Telehealth: Payer: Self-pay | Admitting: Cardiology

## 2023-06-28 NOTE — Telephone Encounter (Signed)
 Called patient to discuss stress test results, no answer. Callback number provided.

## 2023-06-29 ENCOUNTER — Ambulatory Visit: Payer: Self-pay | Admitting: Cardiology

## 2023-07-05 ENCOUNTER — Ambulatory Visit: Payer: Self-pay | Admitting: Cardiology

## 2023-07-07 NOTE — Progress Notes (Unsigned)
 Cardiology Office Note    Date:  07/09/2023  ID:  Janet Johnston, Janet Johnston 03-May-1950, MRN [id number] PCP:  Royden Ronal Czar, FNP  Cardiologist:  Redell Shallow, MD  Electrophysiologist:  None   Chief Complaint: Follow up for CAD   History of Present Illness: .    Janet Johnston is a 73 y.o. 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   Cardiac catheterization in December 2019 showed 95% proximal to mid LAD which was treated with drug-eluting stent.  Ejection fraction at that time was 55 to 65%.  Abdominal ultrasound in 04/2017 showed ectatic abdominal aorta, recommended for follow-up in 5 years.  Echocardiogram in 08/2019 showed normal LV function, grade 1 DD.  Nuclear study in 08/2019 showed EF 69% with normal perfusion.  Abdominal ultrasound on 07/18/2022 showed no evidence of abdominal aortic aneurysm, largest aortic measurement was 2.6 cm, there is a normal tapering of the abdominal aorta, largest diameter noted in the distal segment, largest aortic diameter remained essentially unchanged compared to prior exam.  Patient was seen in clinic on 03/12/2022 by Dr. Shallow.  She remained stable from a cardiac standpoint.  She had recently been diagnosed with a DVT by her podiatrist and was started on Eliquis.  Patient was seen in clinic on 04/03/2023 for follow-up.  She remained stable from cardiac standpoint.  She did note some right sided chest discomfort in the last week after she had been moving furniture and flooring, felt to be musculoskeletal related.  She noted stable dyspnea with ongoing exertion.  She did report that her blood pressure has been elevated at home with systolics in the 160s and 150s when she had seen her PCP the prior week.  Patient was started on amlodipine  5 mg daily for elevated blood pressures.  Patient was last seen in clinic on 06/20/2023 for  follow-up.  She reported that her PCP had increased her amlodipine  to 10 mg daily and was noting wide fluctuations in her blood pressure however on further discussion patient reported that she was taking her blood pressure pursing in the morning about 15 minutes after she gets up and taking care of her animals with systolics between 140 and 150 however in the afternoon after taking her blood pressure medications it would vary between 104/62 122/66.  Patient reported on occasion she would have to hold her amlodipine  as her blood pressure become too low.  Patient's amlodipine  was decreased back to 5 mg daily.  On further discussion patient continued to note some right sided chest discomfort however noted to have become more persistent, not associated with exertion.  She did not feel was very similar to her prior angina however discomfort would start any middle of her chest and moved into the right side, not improved or changed with palpation or movement of the right arm.  She questioned if it was related to her gallbladder.  She agreed to undergo MPI, there was noted moderate defect in the anterolateral wall that improved in the rest images consistent with ischemia in the distribution, otherwise had normal perfusion, noted to be an intermediate risk study.  Today patient presents for follow-up to review her stress test.  She reports that she is doing well overall.  She continues to note intermittent right sided chest discomfort, not specifically associated with exertion.  She notes very rare occurrences of some mild left-sided discomfort, she reports that  this is not very similar to her prior angina, notes that she typically had more chest discomfort with increased shortness of breath that she has not had.  She denies any lower extremity edema, orthopnea or PND.  ROS: .   Today she denies shortness of breath, lower extremity edema, fatigue, palpitations, melena, hematuria, hemoptysis, diaphoresis, weakness,  presyncope, syncope, orthopnea, and PND.  All other systems are reviewed and otherwise negative. Studies Reviewed: SABRA   EKG:  EKG is ordered today, personally reviewed, demonstrating  EKG Interpretation Date/Time:  Monday July 08 2023 14:37:49 EDT Ventricular Rate:  61 PR Interval:  134 QRS Duration:  76 QT Interval:  408 QTC Calculation: 410 R Axis:   7  Text Interpretation: Normal sinus rhythm Normal ECG When compared with ECG of 20-Jun-2023 11:19, No significant change was found Confirmed by Caldwell Kronenberger [phone]) on 07/09/2023 5:31:44 PM   CV Studies: Cardiac studies reviewed are outlined and summarized above. Otherwise please see EMR for full report. Cardiac Studies & Procedures   ______________________________________________________________________________________________ CARDIAC CATHETERIZATION  CARDIAC CATHETERIZATION 12/23/2017  Conclusion  Prox LAD to Mid LAD lesion is 95% stenosed.  Post intervention, there is a 0% residual stenosis.  A drug-eluting stent was successfully placed using a STENT SYNERGY DES 3X24.  The left ventricular systolic function is normal.  LV end diastolic pressure is normal.  The left ventricular ejection fraction is 55-65% by visual estimate.  1. Single vessel obstructive CAD involving the mid LAD 2. Normal LV function 3. Normal LVEDP 4. Successful PCI of the LAD with DES x 1.  Plan: DAPT for one year. Will watch overnight with IV hydration due to CKD. Anticipate DC in am.  Findings Coronary Findings Diagnostic  Dominance: Right  Left Main Vessel was injected. Vessel is normal in caliber. The vessel exhibits minimal luminal irregularities.  Left Anterior Descending Prox LAD to Mid LAD lesion is 95% stenosed.  Left Circumflex Vessel was injected. Vessel is normal in caliber. The vessel exhibits minimal luminal irregularities.  Right Coronary Artery Vessel was injected. Vessel is normal in caliber. The vessel exhibits minimal luminal  irregularities.  Intervention  Prox LAD to Mid LAD lesion Stent CATH VISTA GUIDE 6FR XBLAD3.5 guide catheter was inserted. Lesion crossed with guidewire using a WIRE ASAHI PROWATER 180CM. Pre-stent angioplasty was performed using a BALLOON SAPPHIRE 2.5X15. A drug-eluting stent was successfully placed using a STENT SYNERGY DES 3X24. Stent strut is well apposed. Post-stent angioplasty was performed using a BALLOON SAPPHIRE [place] 3.5X18. Maximum pressure:  16 atm. Post-Intervention Lesion Assessment The intervention was successful. Pre-interventional TIMI flow is 3. Post-intervention TIMI flow is 3. No complications occurred at this lesion. There is a 0% residual stenosis post intervention.   CARDIAC CATHETERIZATION  CARDIAC CATHETERIZATION 04/08/2015  Conclusion Images from the original result were not included. 1. Lat 1st Mrg lesion, 45% stenosed. 2. Mid LAD lesion, 45-55% stenosed. FFR 0.87. Non-Physiologically significant 3. The left ventricular systolic function is normal.   Angiographic moderate disease in the LAD and inferior branch of the OM. FFR evaluation of the LAD lesion was performed to confirm false positive stress test. The FFR was not physiologically significant.  Plan:  Standard post radial cath care with TR band removal per protocol  Anticipate discharge from PACU holding area/short stay after bedrest.  Follow-up with Dr. Pietro to discuss other potential etiologies for chest pain.   ANNER ALM ORN, M.D., M.S. Interventional Cardiologist  Pager # [phone] Phone # [phone] [address] [place],  [place] [address]  Findings Coronary Findings Diagnostic  Dominance: Right  Left Main . Vessel is large.  Left Anterior Descending Discrete eccentric.  Pressure wire/FFR was performed on the lesion.  FFR: 0.87.  Starting FFR 0.98  First Diagonal Branch The vessel is moderate in size.  First Septal Branch The vessel is small in  size.  Second Diagonal Branch The vessel is moderate in size.  Second Septal Branch The vessel is small in size.  Left Circumflex  First Obtuse Marginal Branch The vessel is moderate in size.  Lateral First Obtuse Marginal Branch Discrete located at the major branch.  Right Coronary Artery . Vessel is large.  Acute Marginal Branch The vessel is moderate in size.  Right Posterior Descending Artery The vessel is moderate in size.  Right Posterior Atrioventricular Artery The vessel is moderate in size.  First Right Posterolateral Branch The vessel is small in size.  Second Right Posterolateral Branch The vessel is small in size.  Third Right Posterolateral Branch The vessel is small in size.  Intervention  No interventions have been documented.   STRESS TESTS  MYOCARDIAL PERFUSION IMAGING 06/27/2023  Narrative   Lexiscan  stress shows no EKG changes from baseline EKG   Moderate defect in the anterolateral wall (base, mid, minimally distal) that improves in the rest images consistent with ischemia in this distribution. Otherwise normal perfusion.   LVEF is normal at 72% with normal wall motion   Coronary calcium  was present on the attenuation correction CT images. Moderate coronary calcifications were present. Coronary calcifications were present in the left anterior descending artery, left circumflex artery and right coronary artery distribution(s).   Prior study not available for comparison.   Intermediate risk study   ECHOCARDIOGRAM  ECHOCARDIOGRAM COMPLETE 08/28/2019  Narrative ECHOCARDIOGRAM REPORT    Patient Name:   Janet Johnston Date of Exam: 08/28/2019 Medical Rec #:  [id number]         Height:       64.0 in Accession #:    [id number]        Weight:       207.0 lb Date of Birth:  1950/11/07         BSA:          1.985 m Patient Age:    69 years          BP:           110/82 mmHg Patient Gender: F                 HR:           62 bpm. Exam  Location:  [address]  Procedure: 3D Echo, 2D Echo, Cardiac Doppler, Color Doppler and Strain Analysis  Indications:    R06.02 Shortness of breath  History:        Patient has no prior history of Echocardiogram examinations. CAD; Risk Factors:Current Smoker, Hypertension and Dyslipidemia. AAA. CKD stage 3.  Sonographer:    Marshia Rea RAMAN, RDCS Referring Phys: [id number] BRIAN S CRENSHAW  IMPRESSIONS   1. Left ventricular ejection fraction, by estimation, is 55 to 60%. The left ventricle has normal function. The left ventricle has no regional wall motion abnormalities. Left ventricular diastolic parameters are consistent with Grade I diastolic dysfunction (impaired relaxation). 2. Right ventricular systolic function is normal. The right ventricular size is normal. There is normal pulmonary artery systolic pressure. 3. The mitral valve is normal in structure. Trivial mitral valve regurgitation. No evidence of mitral stenosis.  4. The aortic valve is tricuspid. Aortic valve regurgitation is not visualized. Mild aortic valve sclerosis is present, with no evidence of aortic valve stenosis. 5. The inferior vena cava is normal in size with greater than 50% respiratory variability, suggesting right atrial pressure of 3 mmHg.  FINDINGS Left Ventricle: Left ventricular ejection fraction, by estimation, is 55 to 60%. The left ventricle has normal function. The left ventricle has no regional wall motion abnormalities. The left ventricular internal cavity size was normal in size. There is no left ventricular hypertrophy. Left ventricular diastolic parameters are consistent with Grade I diastolic dysfunction (impaired relaxation).  Right Ventricle: The right ventricular size is normal. No increase in right ventricular wall thickness. Right ventricular systolic function is normal. There is normal pulmonary artery systolic pressure. The tricuspid regurgitant velocity is 2.24 m/s, and with an assumed right  atrial pressure of 3 mmHg, the estimated right ventricular systolic pressure is 23.1 mmHg.  Left Atrium: Left atrial size was normal in size.  Right Atrium: Right atrial size was normal in size.  Pericardium: There is no evidence of pericardial effusion.  Mitral Valve: The mitral valve is normal in structure. Normal mobility of the mitral valve leaflets. Trivial mitral valve regurgitation. No evidence of mitral valve stenosis.  Tricuspid Valve: The tricuspid valve is normal in structure. Tricuspid valve regurgitation is mild . No evidence of tricuspid stenosis.  Aortic Valve: The aortic valve is tricuspid. Aortic valve regurgitation is not visualized. Mild aortic valve sclerosis is present, with no evidence of aortic valve stenosis.  Pulmonic Valve: The pulmonic valve was normal in structure. Pulmonic valve regurgitation is not visualized. No evidence of pulmonic stenosis.  Aorta: The aortic root is normal in size and structure.  Venous: The inferior vena cava is normal in size with greater than 50% respiratory variability, suggesting right atrial pressure of 3 mmHg.  IAS/Shunts: No atrial level shunt detected by color flow Doppler.   LEFT VENTRICLE PLAX 2D LVIDd:         4.40 cm  Diastology LVIDs:         2.90 cm  LV e' lateral:   6.75 cm/s LV PW:         1.00 cm  LV E/e' lateral: 10.0 LV IVS:        0.80 cm  LV e' medial:    4.95 cm/s LVOT diam:     2.00 cm  LV E/e' medial:  13.7 LV SV:         77 LV SV Index:   39       2D Longitudinal Strain LVOT Area:     3.14 cm 2D Strain GLS (A2C):   -19.7 % 2D Strain GLS (A3C):   -22.5 % 2D Strain GLS (A4C):   -24.0 % 2D Strain GLS Avg:     -22.1 %  3D Volume EF: 3D EF:        59 % LV EDV:       99 ml LV ESV:       41 ml LV SV:        59 ml  RIGHT VENTRICLE RV Basal diam:  3.90 cm RV S prime:     9.45 cm/s TAPSE (M-mode): 1.7 cm RVSP:           23.1 mmHg  LEFT ATRIUM             Index       RIGHT ATRIUM  Index LA  diam:        3.60 cm 1.81 cm/m  RA Pressure: 3.00 mmHg LA Vol (A2C):   49.7 ml 25.04 ml/m RA Area:     10.30 cm LA Vol (A4C):   25.2 ml 12.69 ml/m RA Volume:   19.00 ml  9.57 ml/m LA Biplane Vol: 36.1 ml 18.18 ml/m AORTIC VALVE LVOT Vmax:   104.00 cm/s LVOT Vmean:  73.100 cm/s LVOT VTI:    0.246 m  AORTA Ao Root diam: 3.20 cm Ao Asc diam:  3.60 cm  MITRAL VALVE               TRICUSPID VALVE TR Peak grad:   20.1 mmHg TR Vmax:        224.00 cm/s MV E velocity: 67.70 cm/s  Estimated RAP:  3.00 mmHg MV A velocity: 73.30 cm/s  RVSP:           23.1 mmHg MV E/A ratio:  0.92 SHUNTS Systemic VTI:  0.25 m Systemic Diam: 2.00 cm  Redell Shallow MD Electronically signed by Redell Shallow MD Signature Date/Time: 08/28/2019/12:45:37 PM    Final          ______________________________________________________________________________________________       Current Reported Medications:.    Current Meds  Medication Sig   amLODipine  (NORVASC ) 5 MG tablet Take 1 tablet (5 mg total) by mouth daily.   bisoprolol  (ZEBETA ) 10 MG tablet Take 10 mg by mouth daily.   ergocalciferol  (VITAMIN D2) 50000 UNITS capsule Take 50,000 Units by mouth once a week. Every other week   FLUoxetine  (PROZAC ) 20 MG capsule Take 40 mg by mouth every morning.   isosorbide mononitrate (IMDUR) 30 MG 24 hr tablet Take 1 tablet (30 mg total) by mouth daily.   losartan  (COZAAR ) 50 MG tablet Take 1 tablet (50 mg total) by mouth daily.   Magnesium  250 MG TABS 3x weekly   Multiple Vitamins-Minerals (CENTRUM ADULTS PO) Take 1 tablet by mouth daily.   nitroGLYCERIN  (NITROSTAT ) 0.4 MG SL tablet DISSOLVE 1 TABLET UNDER THE TONGUE EVERY 5 MINUTES FOR UP TO 3 DOSES AS NEEDED FOR CHEST PAIN. IF NO RELIEF AFTER 3 DOSES, CALL 911 OR GO TO ER.   predniSONE  (DELTASONE ) 5 MG tablet Take 5 mg by mouth daily with breakfast.   REPATHA  SURECLICK 140 MG/ML SOAJ Inject 1 Syringe into the skin every 14 (fourteen) days.    [DISCONTINUED] losartan  (COZAAR ) 50 MG tablet Take 100 mg by mouth daily.    Physical Exam:    VS:  BP 110/70   Pulse 61   Ht 5' 3 (1.6 m)   Wt 195 lb 6.4 oz (88.6 kg)   SpO2 95%   BMI 34.61 kg/m    Wt Readings from Last 3 Encounters:  07/08/23 195 lb 6.4 oz (88.6 kg)  06/20/23 190 lb 3.2 oz (86.3 kg)  05/06/23 190 lb 12.8 oz (86.5 kg)    GEN: Well nourished, well developed in no acute distress NECK: No JVD; No carotid bruits CARDIAC: RRR, no murmurs, rubs, gallops RESPIRATORY:  Clear to auscultation without rales, wheezing or rhonchi  ABDOMEN: Soft, non-tender, non-distended EXTREMITIES:  No edema; No acute deformity     Asessement and Plan:.    CAD: s/p DES to LAD in 12/2017.  Echo in 08/2019 showed normal LV function, G1 DD.  Nuclear study in 08/2019 showed EF 69% with normal perfusion. MPI in 06/2023 noted moderate defect in the anterolateral wall that improved in the rest images  consistent with ischemia in the distribution, otherwise had normal perfusion, noted to be an intermediate risk study. Today she reports that she feels overall well, continues to note intermittent right sided chest discomfort, specifically associated with exertion.  She notes this is not similar to her prior anginal equivalent.  She denies significant shortness of breath.  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  Given this through shared decision making elected to work towards medication management at this time.  Will decrease her losartan  to 50 mg daily and start Imdur 30 mg daily.  Reviewed ED precautions with patient.  Hypertension: Blood pressure today 110/70.  She reports that her blood pressure has been very well-controlled at home.  Will reduce losartan  to 50 mg daily and initiate Imdur 30 mg daily as noted above.  Hyperlipidemia: Last lipid profile on 05/17/2023 indicated total cholesterol 168, HDL 99,  triglycerides 88 and LDL 53.  Continue Repatha  and Zetia .  CKD stage III: Last creatinine 1.5.,  EGFR 37.  Patient notes that she has a history of an atrophic left kidney, reports is no longer functioning.  She wants to avoid contrast as much as possible. Follows with nephrologist, Dr. Burnard Scala, [facility].   OSA: Patient reports CPAP adherence.    Disposition: F/u with Rosaire Cueto, NP on 08/16/23 as planned.   Signed, Yaniyah Koors D Esli Clements, NP

## 2023-07-08 ENCOUNTER — Encounter: Payer: Self-pay | Admitting: Cardiology

## 2023-07-08 ENCOUNTER — Ambulatory Visit: Attending: Cardiology | Admitting: Cardiology

## 2023-07-08 VITALS — BP 110/70 | HR 61 | Ht 63.0 in | Wt 195.4 lb

## 2023-07-08 DIAGNOSIS — N1832 Chronic kidney disease, stage 3b: Secondary | ICD-10-CM

## 2023-07-08 DIAGNOSIS — I2510 Atherosclerotic heart disease of native coronary artery without angina pectoris: Secondary | ICD-10-CM

## 2023-07-08 DIAGNOSIS — I10 Essential (primary) hypertension: Secondary | ICD-10-CM | POA: Diagnosis not present

## 2023-07-08 DIAGNOSIS — N261 Atrophy of kidney (terminal): Secondary | ICD-10-CM

## 2023-07-08 DIAGNOSIS — E7800 Pure hypercholesterolemia, unspecified: Secondary | ICD-10-CM

## 2023-07-08 DIAGNOSIS — G4733 Obstructive sleep apnea (adult) (pediatric): Secondary | ICD-10-CM | POA: Diagnosis not present

## 2023-07-08 MED ORDER — ISOSORBIDE MONONITRATE ER 30 MG PO TB24: 30.0000 mg | ORAL_TABLET | Freq: Every day | ORAL | 3 refills | Status: AC

## 2023-07-08 MED ORDER — LOSARTAN POTASSIUM 50 MG PO TABS: 50.0000 mg | ORAL_TABLET | Freq: Every day | ORAL | 3 refills | Status: DC

## 2023-07-08 NOTE — Patient Instructions (Signed)
 Medication Instructions:  Decrease losartan  50 mg once a day Start Imdur 30 mg once a day  *If you need a refill on your cardiac medications before your next appointment, please call your pharmacy*  Lab Work: No testing  Testing/Procedures: No testing  Follow-Up: At [facility], you and your health needs are our priority.  As part of our continuing mission to provide you with exceptional heart care, our providers are all part of one team.  This team includes your primary Cardiologist (physician) and Advanced Practice Providers or APPs (Physician Assistants and Nurse Practitioners) who all work together to provide you with the care you need, when you need it.  Your next appointment:   3 month(s)  Provider:   Redell Shallow, MD    We recommend signing up for the patient portal called MyChart.  Sign up information is provided on this After Visit Summary.  MyChart is used to connect with patients for Virtual Visits (Telemedicine).  Patients are able to view lab/test results, encounter notes, upcoming appointments, etc.  Non-urgent messages can be sent to your provider as well.   To learn more about what you can do with MyChart, go to [email or web address].

## 2023-07-09 ENCOUNTER — Encounter: Payer: Self-pay | Admitting: Cardiology

## 2023-07-10 DIAGNOSIS — D692 Other nonthrombocytopenic purpura: Secondary | ICD-10-CM | POA: Diagnosis not present

## 2023-07-10 DIAGNOSIS — L565 Disseminated superficial actinic porokeratosis (DSAP): Secondary | ICD-10-CM | POA: Diagnosis not present

## 2023-07-10 DIAGNOSIS — L304 Erythema intertrigo: Secondary | ICD-10-CM | POA: Diagnosis not present

## 2023-07-10 DIAGNOSIS — D1801 Hemangioma of skin and subcutaneous tissue: Secondary | ICD-10-CM | POA: Diagnosis not present

## 2023-07-10 DIAGNOSIS — L821 Other seborrheic keratosis: Secondary | ICD-10-CM | POA: Diagnosis not present

## 2023-07-10 DIAGNOSIS — L918 Other hypertrophic disorders of the skin: Secondary | ICD-10-CM | POA: Diagnosis not present

## 2023-07-10 DIAGNOSIS — L570 Actinic keratosis: Secondary | ICD-10-CM | POA: Diagnosis not present

## 2023-07-15 ENCOUNTER — Telehealth: Payer: Self-pay | Admitting: Cardiology

## 2023-07-15 NOTE — Telephone Encounter (Signed)
 Spoke with the patient who states that her blood pressure has  been running low. She was recently seen by Janet Johnston and her losartan  was decreased to 50 mg and she was started on Imdur  30 mg. She is also taking amlodipine  5 mg daily and bisoprolol  10 mg. She has been taking her blood pressure daily (1-2 hours after medications) and reports the following readings: 97/48, 102/56, 123/68, 104/54, 107/54. 100/57 and 109/59. She reports feeling washed out. Denies any lightheadedness or dizziness. Reports that she does have balance issues but that has been an ongoing issue. Encouraged patient to stay hydrated and change positions slowly. Will send message to Janet for further recommendations.

## 2023-07-15 NOTE — Telephone Encounter (Signed)
 Pt c/o BP issue: STAT if pt c/o blurred vision, one-sided weakness or slurred speech  1. What are your last 5 BP readings? 97/48  2. Are you having any other symptoms (ex. Dizziness, headache, blurred vision, passed out)? No  3. What is your BP issue? Pt has concerns about it being low lately and wants to know if she can come off of her amLODipine  (NORVASC ) 5 MG tablet. Please advise

## 2023-07-15 NOTE — Telephone Encounter (Signed)
 Called and discussed blood pressure results with Janet Johnston. She notes she is overall feeling well, has had improvement in right sided discomfort with starting on Imdur , although notes she is unsure if this is do to her acid reflux improving. She reports her blood pressures have been running low normal. She denies any dizziness, lightheadedness, presyncope or syncope. Will reduce her Losartan  to 25 mg daily. She will continue to monitor her blood pressure at home and notify the office is persistent or if she develops any symptoms. She will keep follow up as scheduled on 08/16/23.

## 2023-07-25 DIAGNOSIS — M1711 Unilateral primary osteoarthritis, right knee: Secondary | ICD-10-CM | POA: Diagnosis not present

## 2023-07-25 DIAGNOSIS — M1712 Unilateral primary osteoarthritis, left knee: Secondary | ICD-10-CM | POA: Diagnosis not present

## 2023-08-14 NOTE — Progress Notes (Unsigned)
 Cardiology Office Note    Date:  08/16/2023  ID:  Janet Johnston, Janet Johnston 1950/02/17, MRN [id number] PCP:  Royden Ronal Czar, FNP  Cardiologist:  Redell Shallow, MD  Electrophysiologist:  None   Chief Complaint: Follow up for CAD   History of Present Illness: .    Janet Johnston is a 73 y.o. 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   Cardiac catheterization in December 2019 showed 95% proximal to mid LAD which was treated with drug-eluting stent.  Ejection fraction at that time was 55 to 65%.  Abdominal ultrasound in 04/2017 showed ectatic abdominal aorta, recommended for follow-up in 5 years.  Echocardiogram in 08/2019 showed normal LV function, grade 1 DD.  Nuclear study in 08/2019 showed EF 69% with normal perfusion.  Abdominal ultrasound on 07/18/2022 showed no evidence of abdominal aortic aneurysm, largest aortic measurement was 2.6 cm, there is a normal tapering of the abdominal aorta, largest diameter noted in the distal segment, largest aortic diameter remained essentially unchanged compared to prior exam.  Patient was seen in clinic on 03/12/2022 by Dr. Shallow.  She remained stable from a cardiac standpoint.  She had recently been diagnosed with a DVT by her podiatrist and was started on Eliquis.  Patient was seen in clinic on 04/03/2023 for follow-up.  She remained stable from cardiac standpoint.  She did note some right sided chest discomfort in the last week after she had been moving furniture and flooring, felt to be musculoskeletal related.  She noted stable dyspnea with ongoing exertion.  She did report that her blood pressure has been elevated at home with systolics in the 160s and 150s when she had seen her PCP the prior week.  Patient was started on amlodipine  5 mg daily for elevated blood pressures.  Patient was last seen in clinic on 06/20/2023 for  follow-up.  She reported that her PCP had increased her amlodipine  to 10 mg daily and was noting wide fluctuations in her blood pressure however on further discussion patient reported that she was taking her blood pressure pursing in the morning about 15 minutes after she gets up and taking care of her animals with systolics between 140 and 150 however in the afternoon after taking her blood pressure medications it would vary between 104/62 122/66.  Patient reported on occasion she would have to hold her amlodipine  as her blood pressure become too low.  Patient's amlodipine  was decreased back to 5 mg daily.  On further discussion patient continued to note some right sided chest discomfort however noted to have become more persistent, not associated with exertion.  She did not feel was very similar to her prior angina however discomfort would start any middle of her chest and moved into the right side, not improved or changed with palpation or movement of the right arm.  She questioned if it was related to her gallbladder.  She agreed to undergo MPI, there was noted moderate defect in the anterolateral wall that improved in the rest images consistent with ischemia in the distribution, otherwise had normal perfusion, noted to be an intermediate risk study.  Patient was seen in clinic on 07/08/2023 to review her stress test.  She reported that she was doing well overall.  She continued to note intermittent right sided chest discomfort, not specifically associate exertion.  She noted very rare occurrences of some left-sided discomfort, reported was not  similar to her prior angina.  Her losartan  was decreased to 50 mg daily and started on Imdur  30 mg daily.  Today she presents for follow-up.  She reports that she is doing well overall. She notes occasional central and left sided chest discomfort, not typically associated with exertion.  She reports that her previously noted right sided chest discomfort has resolved.  She notes that she has more frequent chest discomfort that wakes her up from sleep around 3 AM, she is unsure if this is related to her history of acid reflux, she notes she will get up and move around with resolution in symptoms.  She does endorse some occasional discomfort with exertion, she reports it is not a true chest pain, pressure or tightness and typically resolves in under 1 minute.  She reports that she has not required any sublingual nitroglycerin .  She does endorse some increased dyspnea on exertion in the last year, she notes she is been unsure if this is related more to humidity or slight increase in weight.  She notes an occasional increased lower extremity edema at the end of the day, resolves overnight.  She denies any orthopnea or PND, she denies any palpitations, presyncope or syncope.  She reports that she was working in the yard this week, denied any chest pain with this, notes that her activity was limited more from arthritis pain.  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  She notes that her grandson stay married at the end of September and is looking forward to baking cakes for the event.   ROS: .   Today she denies fatigue, palpitations, melena, hematuria, hemoptysis, diaphoresis, weakness, presyncope, syncope, orthopnea, and PND.  All other systems are reviewed and otherwise negative. Studies Reviewed: SABRA   EKG:  EKG is not ordered today.  CV Studies: Cardiac studies reviewed are outlined and summarized above. Otherwise please see EMR for full report. Cardiac Studies & Procedures   ______________________________________________________________________________________________ CARDIAC CATHETERIZATION  CARDIAC CATHETERIZATION 12/23/2017  Conclusion  Prox LAD to Mid LAD lesion is 95% stenosed.  Post intervention, there is a 0% residual stenosis.  A drug-eluting  stent was successfully placed using a STENT SYNERGY DES 3X24.  The left ventricular systolic function is normal.  LV end diastolic pressure is normal.  The left ventricular ejection fraction is 55-65% by visual estimate.  1. Single vessel obstructive CAD involving the mid LAD 2. Normal LV function 3. Normal LVEDP 4. Successful PCI of the LAD with DES x 1.  Plan: DAPT for one year. Will watch overnight with IV hydration due to CKD. Anticipate DC in am.  Findings Coronary Findings Diagnostic  Dominance: Right  Left Main Vessel was injected. Vessel is normal in caliber. The vessel exhibits minimal luminal irregularities.  Left Anterior Descending Prox LAD to Mid LAD lesion is 95% stenosed.  Left Circumflex Vessel was injected. Vessel is normal in caliber. The vessel exhibits minimal luminal irregularities.  Right Coronary Artery Vessel was injected. Vessel is normal in caliber. The vessel exhibits minimal luminal irregularities.  Intervention  Prox LAD to Mid LAD lesion Stent CATH VISTA GUIDE 6FR XBLAD3.5 guide catheter was inserted. Lesion crossed with guidewire using a WIRE ASAHI PROWATER 180CM. Pre-stent angioplasty was performed using a BALLOON SAPPHIRE 2.5X15. A drug-eluting stent was successfully placed using a STENT SYNERGY DES 3X24. Stent strut is well apposed. Post-stent angioplasty was performed using a BALLOON  SAPPHIRE [place] 3.5X18. Maximum pressure:  16 atm. Post-Intervention Lesion Assessment The intervention was successful. Pre-interventional TIMI flow is 3. Post-intervention TIMI flow is 3. No complications occurred at this lesion. There is a 0% residual stenosis post intervention.   CARDIAC CATHETERIZATION  CARDIAC CATHETERIZATION 04/08/2015  Conclusion Images from the original result were not included. 1. Lat 1st Mrg lesion, 45% stenosed. 2. Mid LAD lesion, 45-55% stenosed. FFR 0.87. Non-Physiologically significant 3. The left ventricular systolic function  is normal.   Angiographic moderate disease in the LAD and inferior branch of the OM. FFR evaluation of the LAD lesion was performed to confirm false positive stress test. The FFR was not physiologically significant.  Plan:  Standard post radial cath care with TR band removal per protocol  Anticipate discharge from PACU holding area/short stay after bedrest.  Follow-up with Dr. Pietro to discuss other potential etiologies for chest pain.   ANNER ALM ORN, M.D., M.S. Interventional Cardiologist  Pager # [phone] Phone # [phone] [address] [place], [place] [address]  Findings Coronary Findings Diagnostic  Dominance: Right  Left Main . Vessel is large.  Left Anterior Descending Discrete eccentric.  Pressure wire/FFR was performed on the lesion.  FFR: 0.87.  Starting FFR 0.98  First Diagonal Branch The vessel is moderate in size.  First Septal Branch The vessel is small in size.  Second Diagonal Branch The vessel is moderate in size.  Second Septal Branch The vessel is small in size.  Left Circumflex  First Obtuse Marginal Branch The vessel is moderate in size.  Lateral First Obtuse Marginal Branch Discrete located at the major branch.  Right Coronary Artery . Vessel is large.  Acute Marginal Branch The vessel is moderate in size.  Right Posterior Descending Artery The vessel is moderate in size.  Right Posterior Atrioventricular Artery The vessel is moderate in size.  First Right Posterolateral Branch The vessel is small in size.  Second Right Posterolateral Branch The vessel is small in size.  Third Right Posterolateral Branch The vessel is small in size.  Intervention  No interventions have been documented.   STRESS TESTS  MYOCARDIAL PERFUSION IMAGING 06/27/2023  Interpretation Summary   Lexiscan  stress shows no EKG changes from baseline EKG   Moderate defect in the anterolateral wall (base, mid, minimally  distal) that improves in the rest images consistent with ischemia in this distribution. Otherwise normal perfusion.   LVEF is normal at 72% with normal wall motion   Coronary calcium  was present on the attenuation correction CT images. Moderate coronary calcifications were present. Coronary calcifications were present in the left anterior descending artery, left circumflex artery and right coronary artery distribution(s).   Prior study not available for comparison.   Intermediate risk study   ECHOCARDIOGRAM  ECHOCARDIOGRAM COMPLETE 08/28/2019  Narrative ECHOCARDIOGRAM REPORT    Patient Name:   Janet Johnston Date of Exam: 08/28/2019 Medical Rec #:  [id number]         Height:       64.0 in Accession #:    [id number]        Weight:       207.0 lb Date of Birth:  Jan 31, 1950         BSA:          1.985 m Patient Age:    69 years          BP:           110/82 mmHg Patient Gender: F  HR:           62 bpm. Exam Location:  [address]  Procedure: 3D Echo, 2D Echo, Cardiac Doppler, Color Doppler and Strain Analysis  Indications:    R06.02 Shortness of breath  History:        Patient has no prior history of Echocardiogram examinations. CAD; Risk Factors:Current Smoker, Hypertension and Dyslipidemia. AAA. CKD stage 3.  Sonographer:    Marshia Rea RAMAN, RDCS Referring Phys: [id number] BRIAN S CRENSHAW  IMPRESSIONS   1. Left ventricular ejection fraction, by estimation, is 55 to 60%. The left ventricle has normal function. The left ventricle has no regional wall motion abnormalities. Left ventricular diastolic parameters are consistent with Grade I diastolic dysfunction (impaired relaxation). 2. Right ventricular systolic function is normal. The right ventricular size is normal. There is normal pulmonary artery systolic pressure. 3. The mitral valve is normal in structure. Trivial mitral valve regurgitation. No evidence of mitral stenosis. 4. The aortic valve is tricuspid. Aortic  valve regurgitation is not visualized. Mild aortic valve sclerosis is present, with no evidence of aortic valve stenosis. 5. The inferior vena cava is normal in size with greater than 50% respiratory variability, suggesting right atrial pressure of 3 mmHg.  FINDINGS Left Ventricle: Left ventricular ejection fraction, by estimation, is 55 to 60%. The left ventricle has normal function. The left ventricle has no regional wall motion abnormalities. The left ventricular internal cavity size was normal in size. There is no left ventricular hypertrophy. Left ventricular diastolic parameters are consistent with Grade I diastolic dysfunction (impaired relaxation).  Right Ventricle: The right ventricular size is normal. No increase in right ventricular wall thickness. Right ventricular systolic function is normal. There is normal pulmonary artery systolic pressure. The tricuspid regurgitant velocity is 2.24 m/s, and with an assumed right atrial pressure of 3 mmHg, the estimated right ventricular systolic pressure is 23.1 mmHg.  Left Atrium: Left atrial size was normal in size.  Right Atrium: Right atrial size was normal in size.  Pericardium: There is no evidence of pericardial effusion.  Mitral Valve: The mitral valve is normal in structure. Normal mobility of the mitral valve leaflets. Trivial mitral valve regurgitation. No evidence of mitral valve stenosis.  Tricuspid Valve: The tricuspid valve is normal in structure. Tricuspid valve regurgitation is mild . No evidence of tricuspid stenosis.  Aortic Valve: The aortic valve is tricuspid. Aortic valve regurgitation is not visualized. Mild aortic valve sclerosis is present, with no evidence of aortic valve stenosis.  Pulmonic Valve: The pulmonic valve was normal in structure. Pulmonic valve regurgitation is not visualized. No evidence of pulmonic stenosis.  Aorta: The aortic root is normal in size and structure.  Venous: The inferior vena cava is  normal in size with greater than 50% respiratory variability, suggesting right atrial pressure of 3 mmHg.  IAS/Shunts: No atrial level shunt detected by color flow Doppler.   LEFT VENTRICLE PLAX 2D LVIDd:         4.40 cm  Diastology LVIDs:         2.90 cm  LV e' lateral:   6.75 cm/s LV PW:         1.00 cm  LV E/e' lateral: 10.0 LV IVS:        0.80 cm  LV e' medial:    4.95 cm/s LVOT diam:     2.00 cm  LV E/e' medial:  13.7 LV SV:         77 LV SV Index:  39       2D Longitudinal Strain LVOT Area:     3.14 cm 2D Strain GLS (A2C):   -19.7 % 2D Strain GLS (A3C):   -22.5 % 2D Strain GLS (A4C):   -24.0 % 2D Strain GLS Avg:     -22.1 %  3D Volume EF: 3D EF:        59 % LV EDV:       99 ml LV ESV:       41 ml LV SV:        59 ml  RIGHT VENTRICLE RV Basal diam:  3.90 cm RV S prime:     9.45 cm/s TAPSE (M-mode): 1.7 cm RVSP:           23.1 mmHg  LEFT ATRIUM             Index       RIGHT ATRIUM           Index LA diam:        3.60 cm 1.81 cm/m  RA Pressure: 3.00 mmHg LA Vol (A2C):   49.7 ml 25.04 ml/m RA Area:     10.30 cm LA Vol (A4C):   25.2 ml 12.69 ml/m RA Volume:   19.00 ml  9.57 ml/m LA Biplane Vol: 36.1 ml 18.18 ml/m AORTIC VALVE LVOT Vmax:   104.00 cm/s LVOT Vmean:  73.100 cm/s LVOT VTI:    0.246 m  AORTA Ao Root diam: 3.20 cm Ao Asc diam:  3.60 cm  MITRAL VALVE               TRICUSPID VALVE TR Peak grad:   20.1 mmHg TR Vmax:        224.00 cm/s MV E velocity: 67.70 cm/s  Estimated RAP:  3.00 mmHg MV A velocity: 73.30 cm/s  RVSP:           23.1 mmHg MV E/A ratio:  0.92 SHUNTS Systemic VTI:  0.25 m Systemic Diam: 2.00 cm  Redell Shallow MD Electronically signed by Redell Shallow MD Signature Date/Time: 08/28/2019/12:45:37 PM    Final          ______________________________________________________________________________________________       Current Reported Medications:.    Current Meds  Medication Sig   amLODipine  (NORVASC ) 5 MG tablet  Take 1 tablet (5 mg total) by mouth daily.   aspirin  EC 81 MG tablet Take 1 tablet (81 mg total) by mouth daily. Swallow whole.   bisoprolol  (ZEBETA ) 10 MG tablet Take 10 mg by mouth daily.   ergocalciferol  (VITAMIN D2) 50000 UNITS capsule Take 50,000 Units by mouth once a week. Every other week   FLUoxetine  (PROZAC ) 20 MG capsule Take 40 mg by mouth every morning.   isosorbide  mononitrate (IMDUR ) 30 MG 24 hr tablet Take 1 tablet (30 mg total) by mouth daily.   Magnesium  250 MG TABS 3x weekly   Multiple Vitamins-Minerals (CENTRUM ADULTS PO) Take 1 tablet by mouth daily.   nitroGLYCERIN  (NITROSTAT ) 0.4 MG SL tablet DISSOLVE 1 TABLET UNDER THE TONGUE EVERY 5 MINUTES FOR UP TO 3 DOSES AS NEEDED FOR CHEST PAIN. IF NO RELIEF AFTER 3 DOSES, CALL 911 OR GO TO ER.   pantoprazole  (PROTONIX ) 40 MG tablet Take 1 tablet (40 mg total) by mouth daily.   predniSONE  (DELTASONE ) 5 MG tablet Take 5 mg by mouth daily with breakfast.   REPATHA  SURECLICK 140 MG/ML SOAJ Inject 1 Syringe into the skin every 14 (fourteen) days.    Physical  Exam:    VS:  BP 118/74   Pulse 63   Ht 5' 3 (1.6 m)   Wt 196 lb 6.4 oz (89.1 kg)   SpO2 95%   BMI 34.79 kg/m    Wt Readings from Last 3 Encounters:  08/16/23 196 lb 6.4 oz (89.1 kg)  07/08/23 195 lb 6.4 oz (88.6 kg)  06/20/23 190 lb 3.2 oz (86.3 kg)    GEN: Well nourished, well developed in no acute distress NECK: No JVD; No carotid bruits CARDIAC: RRR, no murmurs, rubs, gallops RESPIRATORY:  Clear to auscultation without rales, wheezing or rhonchi  ABDOMEN: Soft, non-tender, non-distended EXTREMITIES:  No edema; No acute deformity     Asessement and Plan:.    CAD: s/p DES to LAD in 12/2017.  Echo in 08/2019 showed normal LV function, G1 DD.  Nuclear study in 08/2019 showed EF 69% with normal perfusion. MPI in 06/2023 noted moderate defect in the anterolateral wall that improved in the rest images consistent with ischemia in the distribution, otherwise had normal  perfusion, noted to be an intermediate risk study.  Patient started on Imdur  30 mg daily.  That her right sided chest discomfort has resolved.  She notes that she will occasionally wake up at 3 or 4 AM with midsternal or left-sided chest discomfort however this resolves when she gets up and moves around.  She also notes that she does have a history of acid reflux and will take Tums with improvement in burning sensation.  Patient notes that she very rarely gets any chest discomfort on exertion and if present lasts for less than 1 minute, she has not required any sublingual nitroglycerin .  She does note some slightly increased dyspnea on exertion in the last year, she is unsure if this is related to humidity or a slight weight gain.  Will check echocardiogram.  Reviewed ED precautions.  Given possible acid reflux related symptoms we will trial a 1 month course of Protonix  40 mg daily.  Continue aspirin  81 mg daily, amlodipine  5 mg daily, Imdur  30 mg daily.  Hypertension: Blood pressure today 118/74. Continue current antihypertensive regimen.  Hyperlipidemia: Last lipid profile on 05/17/2023 indicated total cholesterol 168, HDL 99, triglycerides 88 and LDL 53.  Continue Repatha  and Zetia .  CKD stage III: Last creatinine 1.5. Patient with history of an atrophic left kidney, reports is no longer functioning.  Needs to avoid contrast as much as possible.  She follows with a nephrologist, was seeing Dr. Burnard Scala with [facility], will be seeing new nephrologist as Dr. Scala has retired.  OSA: Patient reports CPAP compliance.    Disposition: F/u with Dr. Pietro in 09/2023 as scheduled.   Signed, Anjeanette Petzold D Ikaika Showers, NP

## 2023-08-16 ENCOUNTER — Encounter: Payer: Self-pay | Admitting: Cardiology

## 2023-08-16 ENCOUNTER — Ambulatory Visit: Attending: Cardiology | Admitting: Cardiology

## 2023-08-16 VITALS — BP 118/74 | HR 63 | Ht 63.0 in | Wt 196.4 lb

## 2023-08-16 DIAGNOSIS — E7800 Pure hypercholesterolemia, unspecified: Secondary | ICD-10-CM | POA: Diagnosis not present

## 2023-08-16 DIAGNOSIS — R0609 Other forms of dyspnea: Secondary | ICD-10-CM

## 2023-08-16 DIAGNOSIS — N1832 Chronic kidney disease, stage 3b: Secondary | ICD-10-CM

## 2023-08-16 DIAGNOSIS — I2510 Atherosclerotic heart disease of native coronary artery without angina pectoris: Secondary | ICD-10-CM | POA: Diagnosis not present

## 2023-08-16 DIAGNOSIS — G4733 Obstructive sleep apnea (adult) (pediatric): Secondary | ICD-10-CM | POA: Diagnosis not present

## 2023-08-16 DIAGNOSIS — N261 Atrophy of kidney (terminal): Secondary | ICD-10-CM

## 2023-08-16 DIAGNOSIS — I10 Essential (primary) hypertension: Secondary | ICD-10-CM

## 2023-08-16 MED ORDER — ASPIRIN 81 MG PO TBEC: 81.0000 mg | DELAYED_RELEASE_TABLET | Freq: Every day | ORAL | Status: AC

## 2023-08-16 MED ORDER — PANTOPRAZOLE SODIUM 40 MG PO TBEC: 40.0000 mg | DELAYED_RELEASE_TABLET | Freq: Every day | ORAL | 0 refills | Status: DC

## 2023-08-16 NOTE — Patient Instructions (Signed)
 Medication Instructions:  Start Protonix  40 mg once a day  Aspirin  81 mg once a day  *If you need a refill on your cardiac medications before your next appointment, please call your pharmacy*  Lab Work: No labs  Testing/Procedures: Your physician has requested that you have an echocardiogram. Echocardiography is a painless test that uses sound waves to create images of your heart. It provides your doctor with information about the size and shape of your heart and how well your heart's chambers and valves are working. This procedure takes approximately one hour. There are no restrictions for this procedure. Please do NOT wear cologne, perfume, aftershave, or lotions (deodorant is allowed). Please arrive 15 minutes prior to your appointment time.  Please note: We ask at that you not bring children with you during ultrasound (echo/ vascular) testing. Due to room size and safety concerns, children are not allowed in the ultrasound rooms during exams. Our front office staff cannot provide observation of children in our lobby area while testing is being conducted. An adult accompanying a patient to their appointment will only be allowed in the ultrasound room at the discretion of the ultrasound technician under special circumstances. We apologize for any inconvenience.  Follow-Up: At [facility], you and your health needs are our priority.  As part of our continuing mission to provide you with exceptional heart care, our providers are all part of one team.  This team includes your primary Cardiologist (physician) and Advanced Practice Providers or APPs (Physician Assistants and Nurse Practitioners) who all work together to provide you with the care you need, when you need it.  Your next appointment:   Keep f/u as scheduled  We recommend signing up for the patient portal called MyChart.  Sign up information is provided on this After Visit Summary.  MyChart is used to connect with patients  for Virtual Visits (Telemedicine).  Patients are able to view lab/test results, encounter notes, upcoming appointments, etc.  Non-urgent messages can be sent to your provider as well.   To learn more about what you can do with MyChart, go to [email or web address].

## 2023-08-19 ENCOUNTER — Ambulatory Visit (HOSPITAL_COMMUNITY)
Admission: RE | Admit: 2023-08-19 | Discharge: 2023-08-19 | Disposition: A | Discharge status: Home / Self Care | Source: Ambulatory Visit | Attending: Surgery | Admitting: Surgery

## 2023-08-19 ENCOUNTER — Other Ambulatory Visit (HOSPITAL_COMMUNITY): Payer: Self-pay | Admitting: Medical

## 2023-08-19 DIAGNOSIS — M25562 Pain in left knee: Secondary | ICD-10-CM

## 2023-09-11 DIAGNOSIS — N184 Chronic kidney disease, stage 4 (severe): Secondary | ICD-10-CM | POA: Diagnosis not present

## 2023-09-11 DIAGNOSIS — N261 Atrophy of kidney (terminal): Secondary | ICD-10-CM | POA: Diagnosis not present

## 2023-09-11 DIAGNOSIS — I129 Hypertensive chronic kidney disease with stage 1 through stage 4 chronic kidney disease, or unspecified chronic kidney disease: Secondary | ICD-10-CM | POA: Diagnosis not present

## 2023-09-24 ENCOUNTER — Ambulatory Visit (HOSPITAL_COMMUNITY)
Admission: RE | Admit: 2023-09-24 | Discharge: 2023-09-24 | Disposition: A | Discharge status: Home / Self Care | Source: Ambulatory Visit | Attending: Cardiovascular Disease | Admitting: Cardiovascular Disease

## 2023-09-24 ENCOUNTER — Ambulatory Visit: Payer: Self-pay | Admitting: Cardiology

## 2023-09-24 DIAGNOSIS — R0609 Other forms of dyspnea: Secondary | ICD-10-CM | POA: Diagnosis not present

## 2023-09-24 LAB — ECHOCARDIOGRAM COMPLETE
Area-P 1/2: 3.23 cm2
S' Lateral: 2.4 cm

## 2023-09-25 DIAGNOSIS — N1832 Chronic kidney disease, stage 3b: Secondary | ICD-10-CM | POA: Diagnosis not present

## 2023-09-25 DIAGNOSIS — M1991 Primary osteoarthritis, unspecified site: Secondary | ICD-10-CM | POA: Diagnosis not present

## 2023-09-25 DIAGNOSIS — M797 Fibromyalgia: Secondary | ICD-10-CM | POA: Diagnosis not present

## 2023-09-25 DIAGNOSIS — M0579 Rheumatoid arthritis with rheumatoid factor of multiple sites without organ or systems involvement: Secondary | ICD-10-CM | POA: Diagnosis not present

## 2023-09-26 DIAGNOSIS — M1712 Unilateral primary osteoarthritis, left knee: Secondary | ICD-10-CM | POA: Diagnosis not present

## 2023-10-01 NOTE — Progress Notes (Signed)
 HPI: FU CAD. Cardiac catheterization December 2019 showed 95% proximal to mid LAD which was treated with drug-eluting stent.  Ejection fraction 55 to 65%. Abdominal ultrasound July 2024 showed no aneurysm.  Nuclear study June 2025 showed ejection fraction 72%, ischemia in the anterior lateral wall.  Patient has been treated medically in light of chronic kidney disease.  Venous Dopplers August 2025 showed no DVT.  Echocardiogram September 2025 showed normal LV function, mild left ventricular hypertrophy, grade 1 diastolic dysfunction, mild left atrial enlargement, mild to moderate tricuspid regurgitation.  Since last seen, she has mild dyspnea on exertion but no orthopnea, PND, pedal edema, exertional chest pain or syncope.  Note the pain that she was experiencing previously was on the right side of her chest and unlike her previous cardiac pain.  Current Outpatient Medications  Medication Sig Dispense Refill   amLODipine  (NORVASC ) 5 MG tablet Take 1 tablet (5 mg total) by mouth daily.     aspirin  EC 81 MG tablet Take 1 tablet (81 mg total) by mouth daily. Swallow whole.     bisoprolol  (ZEBETA ) 10 MG tablet Take 10 mg by mouth daily.     ergocalciferol  (VITAMIN D2) 50000 UNITS capsule Take 50,000 Units by mouth once a week. Every other week     FLUoxetine  (PROZAC ) 20 MG capsule Take 40 mg by mouth every morning.     isosorbide  mononitrate (IMDUR ) 30 MG 24 hr tablet Take 1 tablet (30 mg total) by mouth daily. 90 tablet 3   Magnesium  250 MG TABS 3x weekly     Multiple Vitamins-Minerals (CENTRUM ADULTS PO) Take 1 tablet by mouth daily.     nitroGLYCERIN  (NITROSTAT ) 0.4 MG SL tablet DISSOLVE 1 TABLET UNDER THE TONGUE EVERY 5 MINUTES FOR UP TO 3 DOSES AS NEEDED FOR CHEST PAIN. IF NO RELIEF AFTER 3 DOSES, CALL 911 OR GO TO ER. 25 tablet 2   predniSONE  (DELTASONE ) 5 MG tablet Take 5 mg by mouth daily with breakfast.     REPATHA  SURECLICK 140 MG/ML SOAJ Inject 1 Syringe into the skin every 14 (fourteen)  days.     No current facility-administered medications for this visit.     Past Medical History:  Diagnosis Date   Anxiety    Asthmatic bronchitis    Chronic bronchitis (HCC)    get it q yr; we head w/wood stove (12/23/2017)   Chronic kidney disease    Creatinine level elevated last couple years (12/23/2017)   Chronic lower back pain    COPD (chronic obstructive pulmonary disease) (HCC)    Coronary artery disease    a. LHC 12/23/2017: pLAD to mLAD 95% s/p DES   Depression    DJD (degenerative joint disease)    Fibromyalgia    GERD (gastroesophageal reflux disease)    History of hiatal hernia    History of kidney stones    Hyperlipidemia    Hypertension    Pneumonia    Prediabetes    Rheumatoid arthritis (HCC)    Sleep apnea    home test indicated that I had it; I've never used mask (12/23/2017)   Tension headache    controlled (12/23/2017)    Past Surgical History:  Procedure Laterality Date   BACK SURGERY     BREAST BIOPSY Left 01/24/2022   MM LT BREAST BX W LOC DEV 1ST LESION IMAGE BX SPEC STEREO GUIDE 01/24/2022 [facility]   CARDIAC CATHETERIZATION N/A 04/08/2015   Procedure: Left Heart Cath and Coronary Angiography;  Surgeon: Alm LELON Clay, MD;  Location: [facility] INVASIVE CV LAB;  Service: Cardiovascular;  Laterality: N/A;   CARDIAC CATHETERIZATION N/A 04/08/2015   Procedure: Intravascular Pressure Wire/FFR Study;  Surgeon: Alm LELON Clay, MD;  Location: [facility] INVASIVE CV LAB;  Service: Cardiovascular;  Laterality: N/A;   CATARACT EXTRACTION W/ INTRAOCULAR LENS IMPLANT     ? side (12/23/2017)   CORONARY ANGIOPLASTY WITH STENT PLACEMENT  12/23/2017   CORONARY STENT INTERVENTION N/A 12/23/2017   Procedure: CORONARY STENT INTERVENTION;  Surgeon: Swaziland, [name], MD;  Location: [facility] INVASIVE CV LAB;  Service: Cardiovascular;  Laterality: N/A;   LEFT HEART CATH AND CORONARY ANGIOGRAPHY N/A 12/23/2017   Procedure: LEFT HEART CATH AND CORONARY ANGIOGRAPHY;   Surgeon: Swaziland, [name], MD;  Location: [facility] INVASIVE CV LAB;  Service: Cardiovascular;  Laterality: N/A;   LUMBAR DISC SURGERY     ruptured disc    Social History   Socioeconomic History   Marital status: Widowed    Spouse name: Not on file   Number of children: 3   Years of education: Not on file   Highest education level: Not on file  Occupational History   Occupation: retired    Comment: customer service  Tobacco Use   Smoking status: Former    Current packs/day: 0.50    Types: Cigarettes   Smokeless tobacco: Never   Tobacco comments:    Stopped smoking 4 years ago. Quit December 2019.  Vaping Use   Vaping status: Never Used  Substance and Sexual Activity   Alcohol use: Yes    Comment: 12/23/2017 maybe 1 drink q 6 months   Drug use: Never   Sexual activity: Not Currently  Other Topics Concern   Not on file  Social History Narrative   Right handed    Lives alone    Customer service    Social Drivers of Health   Financial Resource Strain: Not on file  Food Insecurity: Not on file  Transportation Needs: Not on file  Physical Activity: Not on file  Stress: Not on file  Social Connections: Not on file  Intimate Partner Violence: Not on file    Family History  Problem Relation Age of Onset   Heart failure Mother    Heart disease Mother    Diabetes Mother    Hypertension Mother    Emphysema Father    Heart failure Father        CABG, AVR    ROS: no fevers or chills, productive cough, hemoptysis, dysphasia, odynophagia, melena, hematochezia, dysuria, hematuria, rash, seizure activity, orthopnea, PND, pedal edema, claudication. Remaining systems are negative.  Physical Exam: Well-developed well-nourished in no acute distress.  Skin is warm and dry.  HEENT is normal.  Neck is supple.  Chest is clear to auscultation with normal expansion.  Cardiovascular exam is regular rate and rhythm.  Abdominal exam nontender or distended. No masses  palpated. Extremities show no edema. neuro grossly intact  A/P 1 coronary artery disease-continue aspirin .  She is intolerant to statins.  Recent nuclear study showed anterolateral ischemia but was low risk.  She is not having exertional chest pain.  Plan medical therapy at this point.  Can reconsider if she develops more concerning symptoms.  2 hyperlipidemia-intolerant to statins.  Continue Repatha .  3 hypertension-blood pressure borderline; however controlled at home.  Continue present medications and follow.  4 obstructive sleep apnea-continue CPAP.  5 chronic stage III kidney disease-followed by nephrology.  Redell Shallow, MD

## 2023-10-08 ENCOUNTER — Encounter: Payer: Self-pay | Admitting: Cardiology

## 2023-10-08 ENCOUNTER — Ambulatory Visit: Attending: Cardiology | Admitting: Cardiology

## 2023-10-08 VITALS — BP 138/70 | HR 61 | Ht 63.0 in | Wt 191.2 lb

## 2023-10-08 DIAGNOSIS — I10 Essential (primary) hypertension: Secondary | ICD-10-CM | POA: Diagnosis not present

## 2023-10-08 DIAGNOSIS — I2510 Atherosclerotic heart disease of native coronary artery without angina pectoris: Secondary | ICD-10-CM

## 2023-10-08 DIAGNOSIS — E7800 Pure hypercholesterolemia, unspecified: Secondary | ICD-10-CM | POA: Diagnosis not present

## 2023-10-08 DIAGNOSIS — G4733 Obstructive sleep apnea (adult) (pediatric): Secondary | ICD-10-CM | POA: Diagnosis not present

## 2023-10-08 NOTE — Patient Instructions (Signed)
 Medication Instructions:  Your physician recommends that you continue on your current medications as directed. Please refer to the Current Medication list given to you today.  *If you need a refill on your cardiac medications before your next appointment, please call your pharmacy*  Lab Work: None ordered.  You may go to any [facility] Location for your lab work:  [facility] - [address] ([facility]) - [address] [phone] [address]  [place] - [address]   [place]  - [address]   [facility] - [address] - [address] [employer]  - [address] ([facility]   If you have labs (blood work) drawn today and your tests are completely normal, you will receive your results only by: [employer] (if you have MyChart)  If you have any lab test that is abnormal or we need to change your treatment, we will call you or send a MyChart message to review the results.  Testing/Procedures: None ordered.  Follow-Up: At [facility], you and your health needs are our priority.  As part of our continuing mission to provide you with exceptional heart care, we have created designated Provider Care Teams.  These Care Teams include your primary Cardiologist (physician) and Advanced Practice Providers (APPs -  Physician Assistants and Nurse Practitioners) who all work together to provide you with the care you need, when you need it.   Your next appointment:   6 months  The format for your next appointment:   In Person  Provider:   Redell Shallow, MD

## 2023-10-29 ENCOUNTER — Other Ambulatory Visit: Payer: Self-pay | Admitting: Cardiology

## 2023-10-29 MED ORDER — PANTOPRAZOLE SODIUM 40 MG PO TBEC: 40.0000 mg | DELAYED_RELEASE_TABLET | Freq: Every day | ORAL | 0 refills | Status: AC

## 2023-10-29 NOTE — Telephone Encounter (Signed)
 Are you okay with this refill?

## 2023-11-05 DIAGNOSIS — I87391 Chronic venous hypertension (idiopathic) with other complications of right lower extremity: Secondary | ICD-10-CM | POA: Diagnosis not present

## 2023-11-05 DIAGNOSIS — G2581 Restless legs syndrome: Secondary | ICD-10-CM | POA: Diagnosis not present

## 2023-11-05 DIAGNOSIS — I83891 Varicose veins of right lower extremities with other complications: Secondary | ICD-10-CM | POA: Diagnosis not present

## 2023-11-05 DIAGNOSIS — M79661 Pain in right lower leg: Secondary | ICD-10-CM | POA: Diagnosis not present

## 2023-11-05 DIAGNOSIS — R600 Localized edema: Secondary | ICD-10-CM | POA: Diagnosis not present

## 2023-11-21 LAB — LAB REPORT - SCANNED
A1c: 6
EGFR: 34

## 2023-12-12 ENCOUNTER — Encounter: Payer: Self-pay | Admitting: Registered Nurse

## 2023-12-12 ENCOUNTER — Other Ambulatory Visit: Payer: Self-pay | Admitting: Registered Nurse

## 2023-12-12 DIAGNOSIS — R921 Mammographic calcification found on diagnostic imaging of breast: Secondary | ICD-10-CM

## 2024-01-15 ENCOUNTER — Encounter

## 2024-01-16 ENCOUNTER — Ambulatory Visit
Admission: RE | Admit: 2024-01-16 | Discharge: 2024-01-16 | Disposition: A | Discharge status: Home / Self Care | Source: Ambulatory Visit | Attending: Registered Nurse | Admitting: Registered Nurse

## 2024-01-16 DIAGNOSIS — R921 Mammographic calcification found on diagnostic imaging of breast: Secondary | ICD-10-CM

## 2024-01-29 ENCOUNTER — Telehealth: Payer: Self-pay

## 2024-01-29 NOTE — Telephone Encounter (Signed)
 Received CMN from [employer]. Placed into Tammy's sign folder in B Pod. Once signed, will fax back to [employer] at [phone]

## 2024-02-05 NOTE — Telephone Encounter (Signed)
 Faxed signed CMN to [facility] at [phone]. Did not include [id number] notes, as they are requesting notes from after 08/2023 and pt has not been seen since 04/2023. Fax confirmation received, NFN.

## 2024-03-24 ENCOUNTER — Ambulatory Visit: Admitting: Primary Care
